# Patient Record
Sex: Male | Born: 1950 | Race: White | Hispanic: No | Marital: Married | Smoking: Former smoker
Health system: Southern US, Community
[De-identification: ages and names within clinical notes are randomized; demographics above are authoritative.]

## PROBLEM LIST (undated history)

## (undated) DIAGNOSIS — I340 Nonrheumatic mitral (valve) insufficiency: Secondary | ICD-10-CM

## (undated) DIAGNOSIS — T783XXA Angioneurotic edema, initial encounter: Secondary | ICD-10-CM

## (undated) DIAGNOSIS — E119 Type 2 diabetes mellitus without complications: Secondary | ICD-10-CM

## (undated) DIAGNOSIS — I219 Acute myocardial infarction, unspecified: Secondary | ICD-10-CM

## (undated) DIAGNOSIS — G4733 Obstructive sleep apnea (adult) (pediatric): Secondary | ICD-10-CM

## (undated) DIAGNOSIS — R7989 Other specified abnormal findings of blood chemistry: Secondary | ICD-10-CM

## (undated) DIAGNOSIS — I2510 Atherosclerotic heart disease of native coronary artery without angina pectoris: Secondary | ICD-10-CM

## (undated) DIAGNOSIS — I10 Essential (primary) hypertension: Secondary | ICD-10-CM

## (undated) DIAGNOSIS — E785 Hyperlipidemia, unspecified: Secondary | ICD-10-CM

## (undated) DIAGNOSIS — E669 Obesity, unspecified: Secondary | ICD-10-CM

## (undated) DIAGNOSIS — E8881 Metabolic syndrome: Secondary | ICD-10-CM

## (undated) DIAGNOSIS — R011 Cardiac murmur, unspecified: Secondary | ICD-10-CM

## (undated) DIAGNOSIS — E7800 Pure hypercholesterolemia, unspecified: Secondary | ICD-10-CM

## (undated) HISTORY — DX: Type 2 diabetes mellitus without complications: E11.9

## (undated) HISTORY — DX: Obstructive sleep apnea (adult) (pediatric): G47.33

## (undated) HISTORY — DX: Essential (primary) hypertension: I10

## (undated) HISTORY — DX: Cardiac murmur, unspecified: R01.1

## (undated) HISTORY — DX: Acute myocardial infarction, unspecified: I21.9

## (undated) HISTORY — DX: Nonrheumatic mitral (valve) insufficiency: I34.0

## (undated) HISTORY — DX: Obesity, unspecified: E66.9

## (undated) HISTORY — PX: CARDIAC CATHETERIZATION: SHX172

## (undated) HISTORY — DX: Other specified abnormal findings of blood chemistry: R79.89

## (undated) HISTORY — DX: Atherosclerotic heart disease of native coronary artery without angina pectoris: I25.10

## (undated) HISTORY — PX: SKIN SURGERY: SHX2413

## (undated) HISTORY — DX: Pure hypercholesterolemia, unspecified: E78.00

## (undated) HISTORY — DX: Angioneurotic edema, initial encounter: T78.3XXA

## (undated) HISTORY — DX: Metabolic syndrome: E88.810

## (undated) HISTORY — DX: Hyperlipidemia, unspecified: E78.5

## (undated) HISTORY — DX: Metabolic syndrome: E88.81

## (undated) HISTORY — PX: COLONOSCOPY: SHX174

---

## 2009-07-26 ENCOUNTER — Inpatient Hospital Stay (HOSPITAL_BASED_OUTPATIENT_CLINIC_OR_DEPARTMENT_OTHER): Admission: RE | Admit: 2009-07-26 | Discharge: 2009-07-26 | Payer: Self-pay | Admitting: Interventional Cardiology

## 2010-08-31 LAB — POCT I-STAT 3, ART BLOOD GAS (G3+)
Acid-base deficit: 1 mmol/L (ref 0.0–2.0)
O2 Saturation: 97 %
TCO2: 25 mmol/L (ref 0–100)

## 2010-08-31 LAB — POCT I-STAT 3, VENOUS BLOOD GAS (G3P V)
O2 Saturation: 77 %
TCO2: 25 mmol/L (ref 0–100)
pCO2, Ven: 40.8 mmHg — ABNORMAL LOW (ref 45.0–50.0)
pO2, Ven: 43 mmHg (ref 30.0–45.0)

## 2012-10-28 ENCOUNTER — Ambulatory Visit (INDEPENDENT_AMBULATORY_CARE_PROVIDER_SITE_OTHER): Payer: BC Managed Care – PPO

## 2012-10-28 ENCOUNTER — Ambulatory Visit (INDEPENDENT_AMBULATORY_CARE_PROVIDER_SITE_OTHER): Payer: BC Managed Care – PPO | Admitting: Family Medicine

## 2012-10-28 ENCOUNTER — Encounter: Payer: Self-pay | Admitting: Family Medicine

## 2012-10-28 VITALS — BP 111/65 | HR 65 | Temp 97.4°F | Ht 70.0 in | Wt 252.0 lb

## 2012-10-28 DIAGNOSIS — M25511 Pain in right shoulder: Secondary | ICD-10-CM

## 2012-10-28 DIAGNOSIS — E785 Hyperlipidemia, unspecified: Secondary | ICD-10-CM

## 2012-10-28 DIAGNOSIS — I10 Essential (primary) hypertension: Secondary | ICD-10-CM

## 2012-10-28 DIAGNOSIS — M25569 Pain in unspecified knee: Secondary | ICD-10-CM

## 2012-10-28 DIAGNOSIS — M25562 Pain in left knee: Secondary | ICD-10-CM

## 2012-10-28 DIAGNOSIS — M25519 Pain in unspecified shoulder: Secondary | ICD-10-CM | POA: Insufficient documentation

## 2012-10-28 DIAGNOSIS — E669 Obesity, unspecified: Secondary | ICD-10-CM | POA: Insufficient documentation

## 2012-10-28 DIAGNOSIS — R739 Hyperglycemia, unspecified: Secondary | ICD-10-CM | POA: Insufficient documentation

## 2012-10-28 DIAGNOSIS — R7309 Other abnormal glucose: Secondary | ICD-10-CM

## 2012-10-28 HISTORY — DX: Essential (primary) hypertension: I10

## 2012-10-28 IMAGING — CR DG SHOULDER 2+V*R*
4 series · 4 of 4 positions shown · non-contrast
Comparison: 07/25/2009

CLINICAL DATA: Right shoulder pain

RIGHT SHOULDER - 2+ VIEW

[view not recorded (1 of 4)]
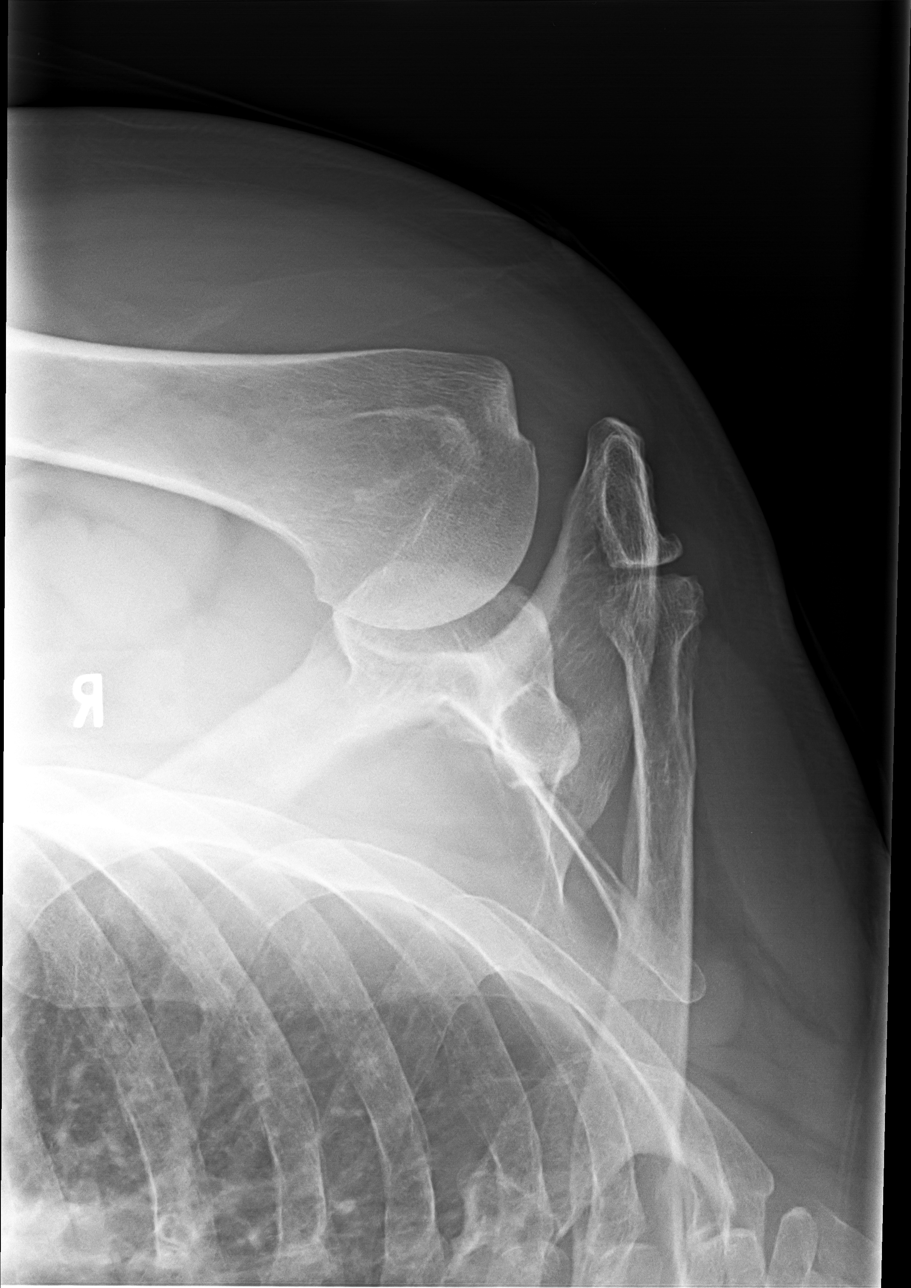

[view not recorded (2 of 4)]
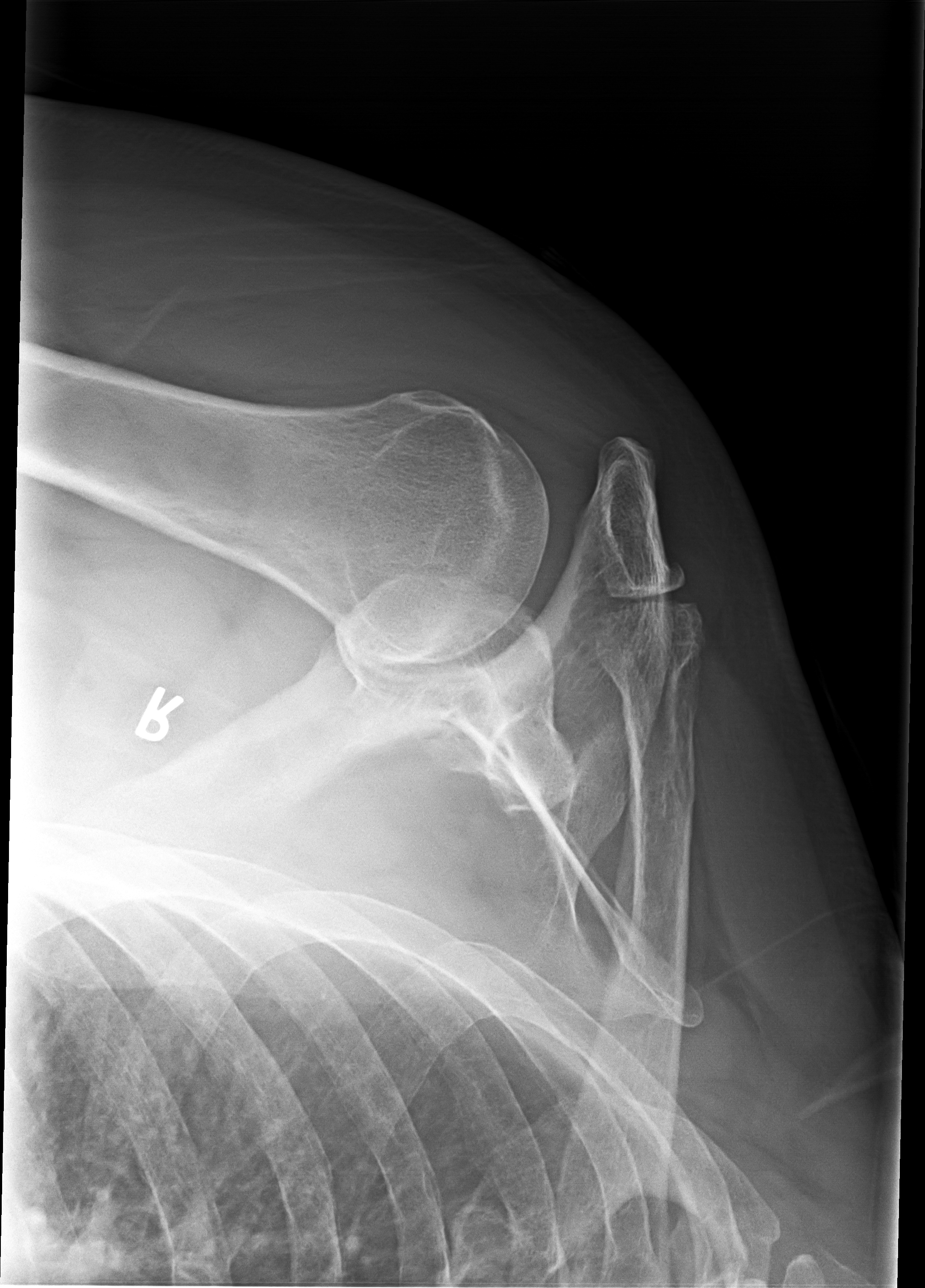

[view not recorded (3 of 4)]
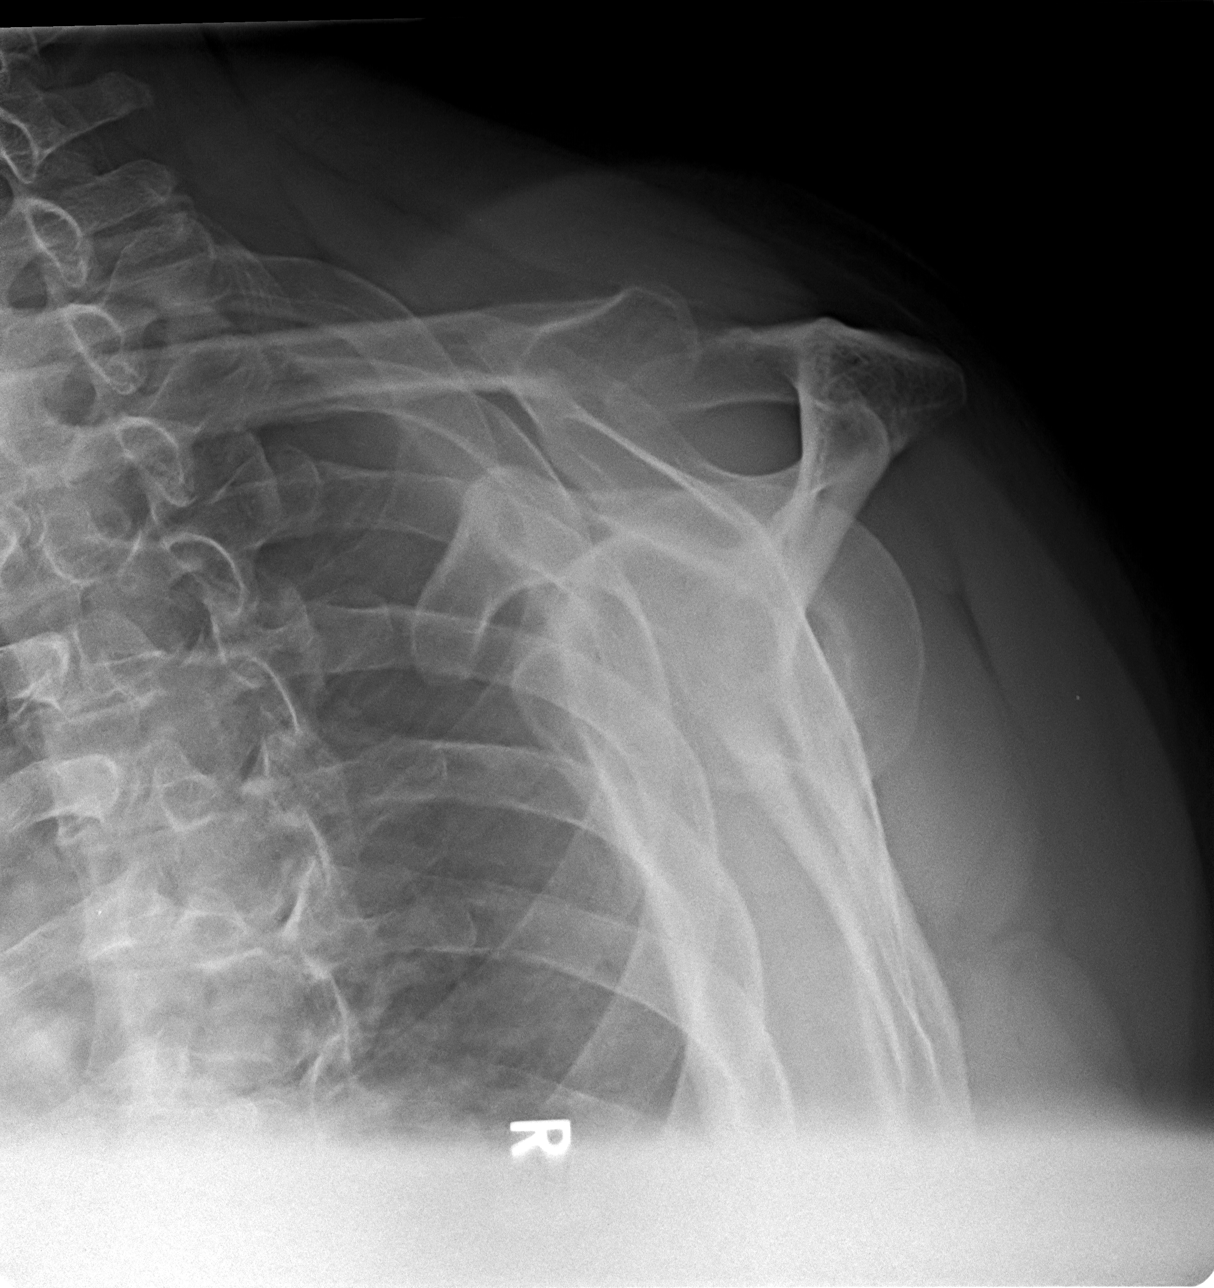

[view not recorded (4 of 4)]
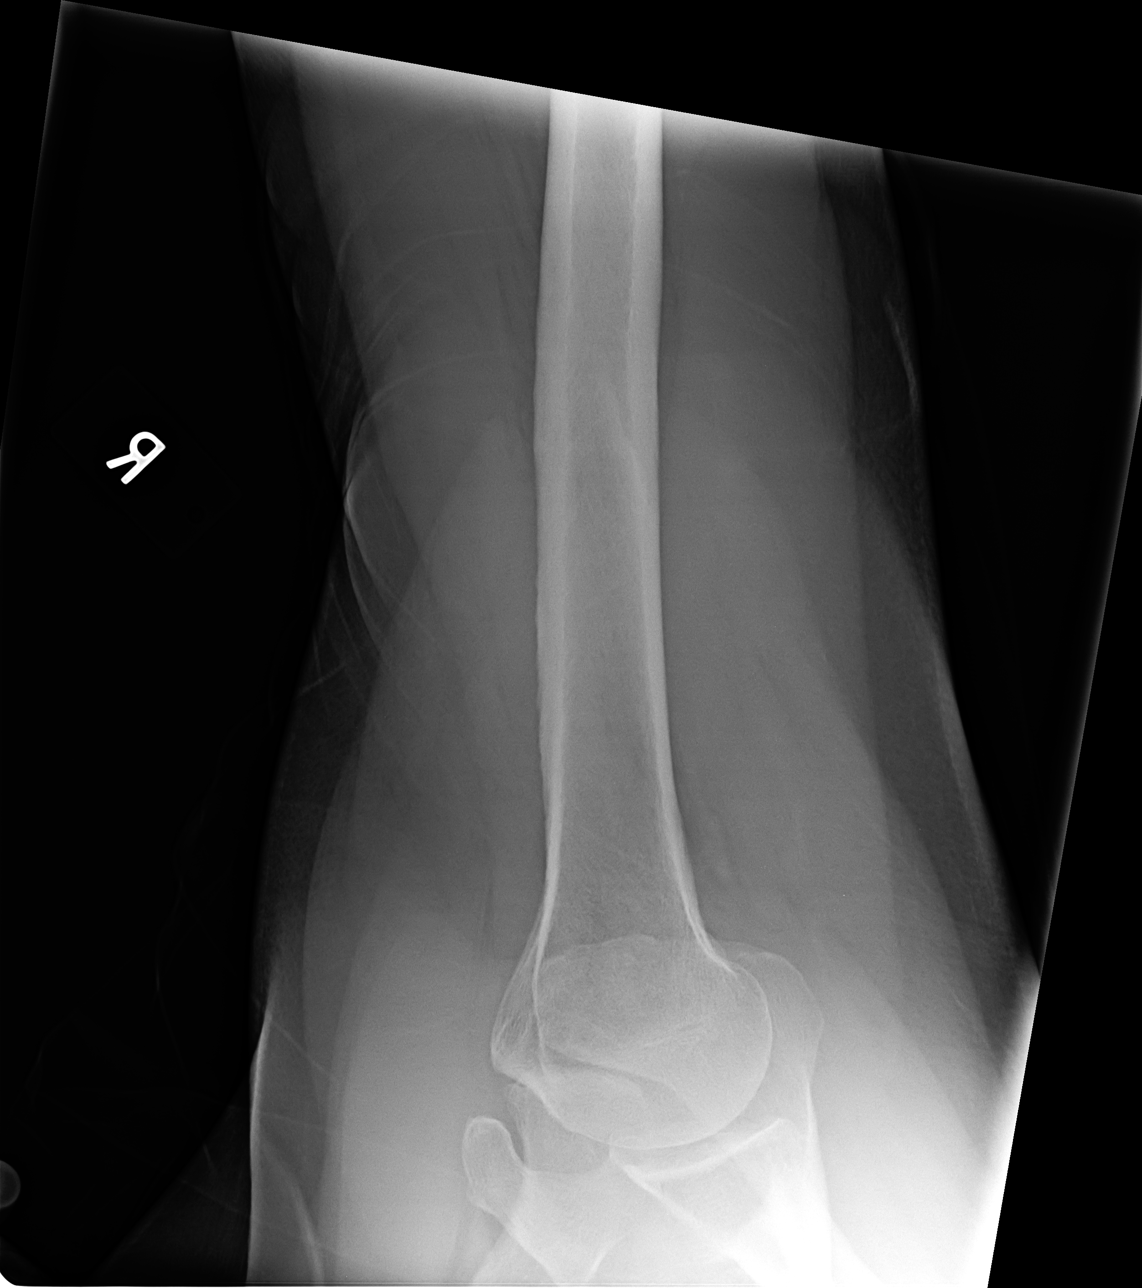

[4 of 4 positions shown; findings below may reference images not displayed]

FINDINGS: Mild right glenohumeral joint degenerative change and AC
joint spurring noted.  No fracture or dislocation.  Right lung apex
is clear.  No soft tissue abnormality.
IMPRESSION: No acute osseous abnormality.

## 2012-10-28 IMAGING — CR DG KNEE 1-2V*L*
2 series · 2 of 2 positions shown · non-contrast
Comparison: None.

CLINICAL DATA: Left knee pain and effusion

LEFT KNEE - 1-2 VIEW

[view not recorded (1 of 2)]
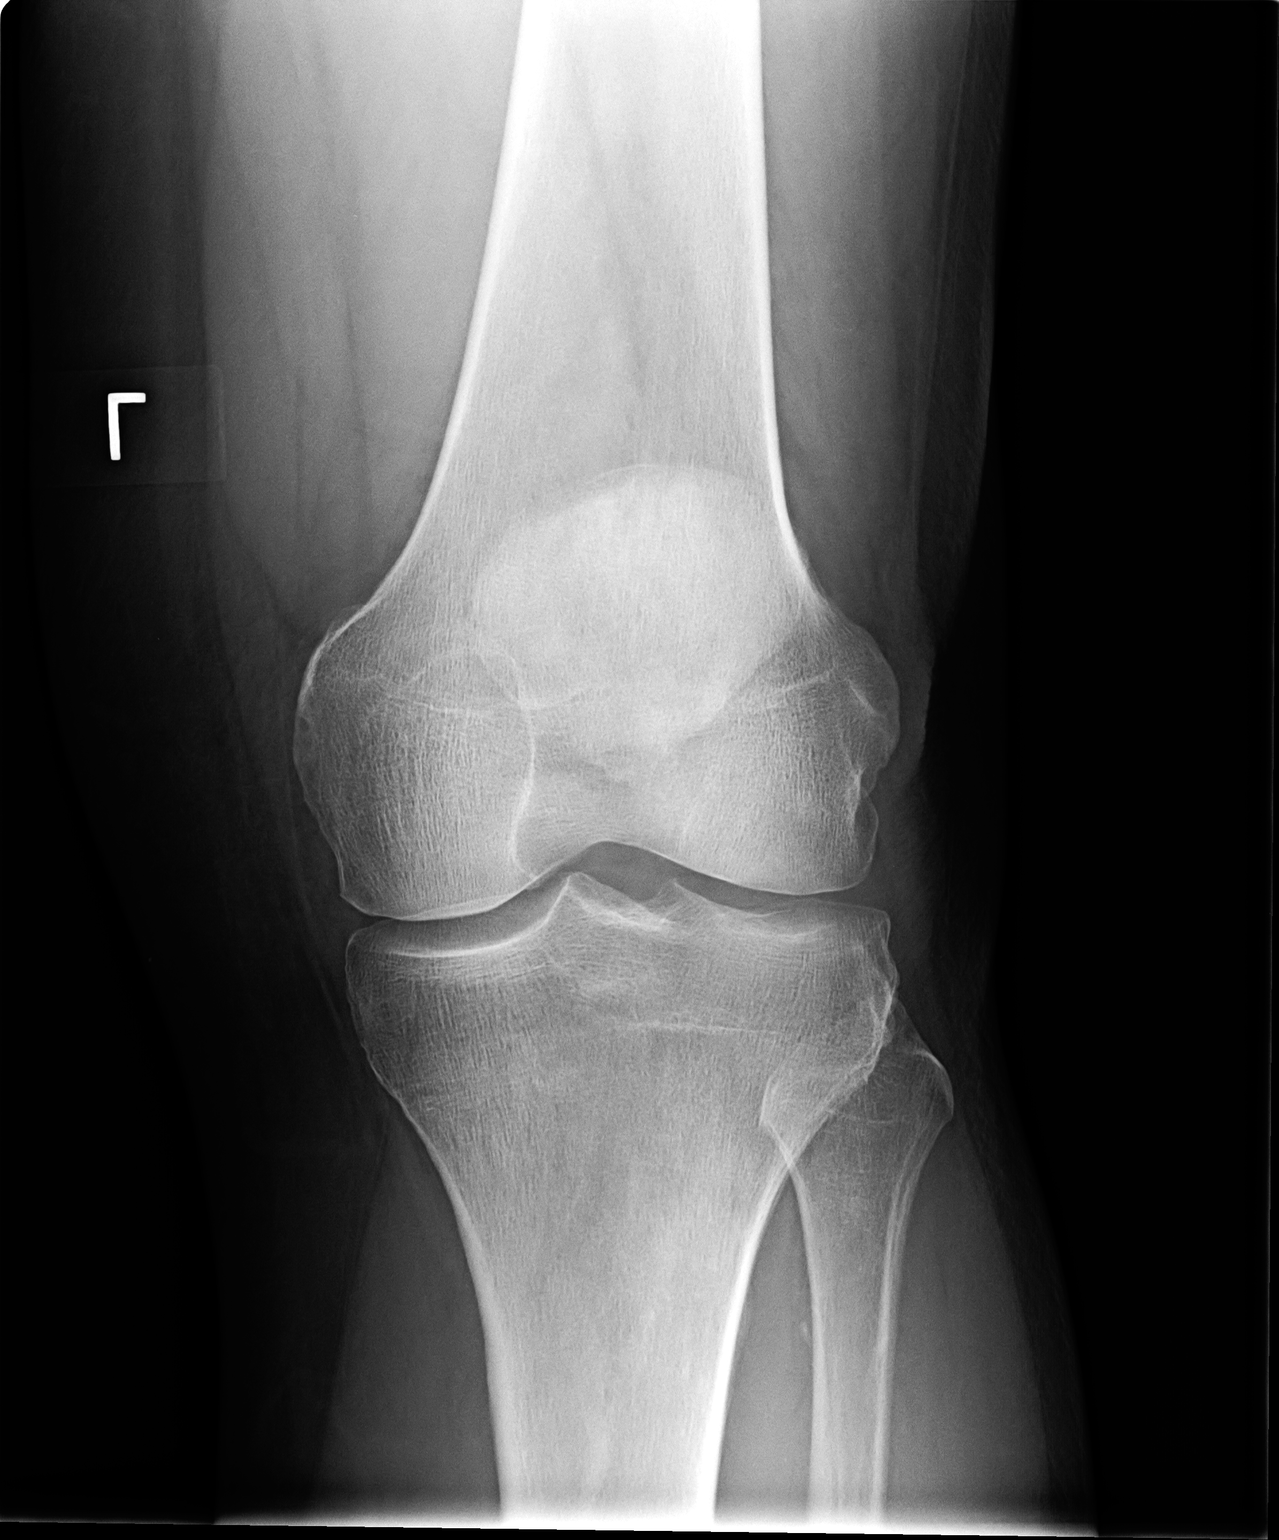

[view not recorded (2 of 2)]
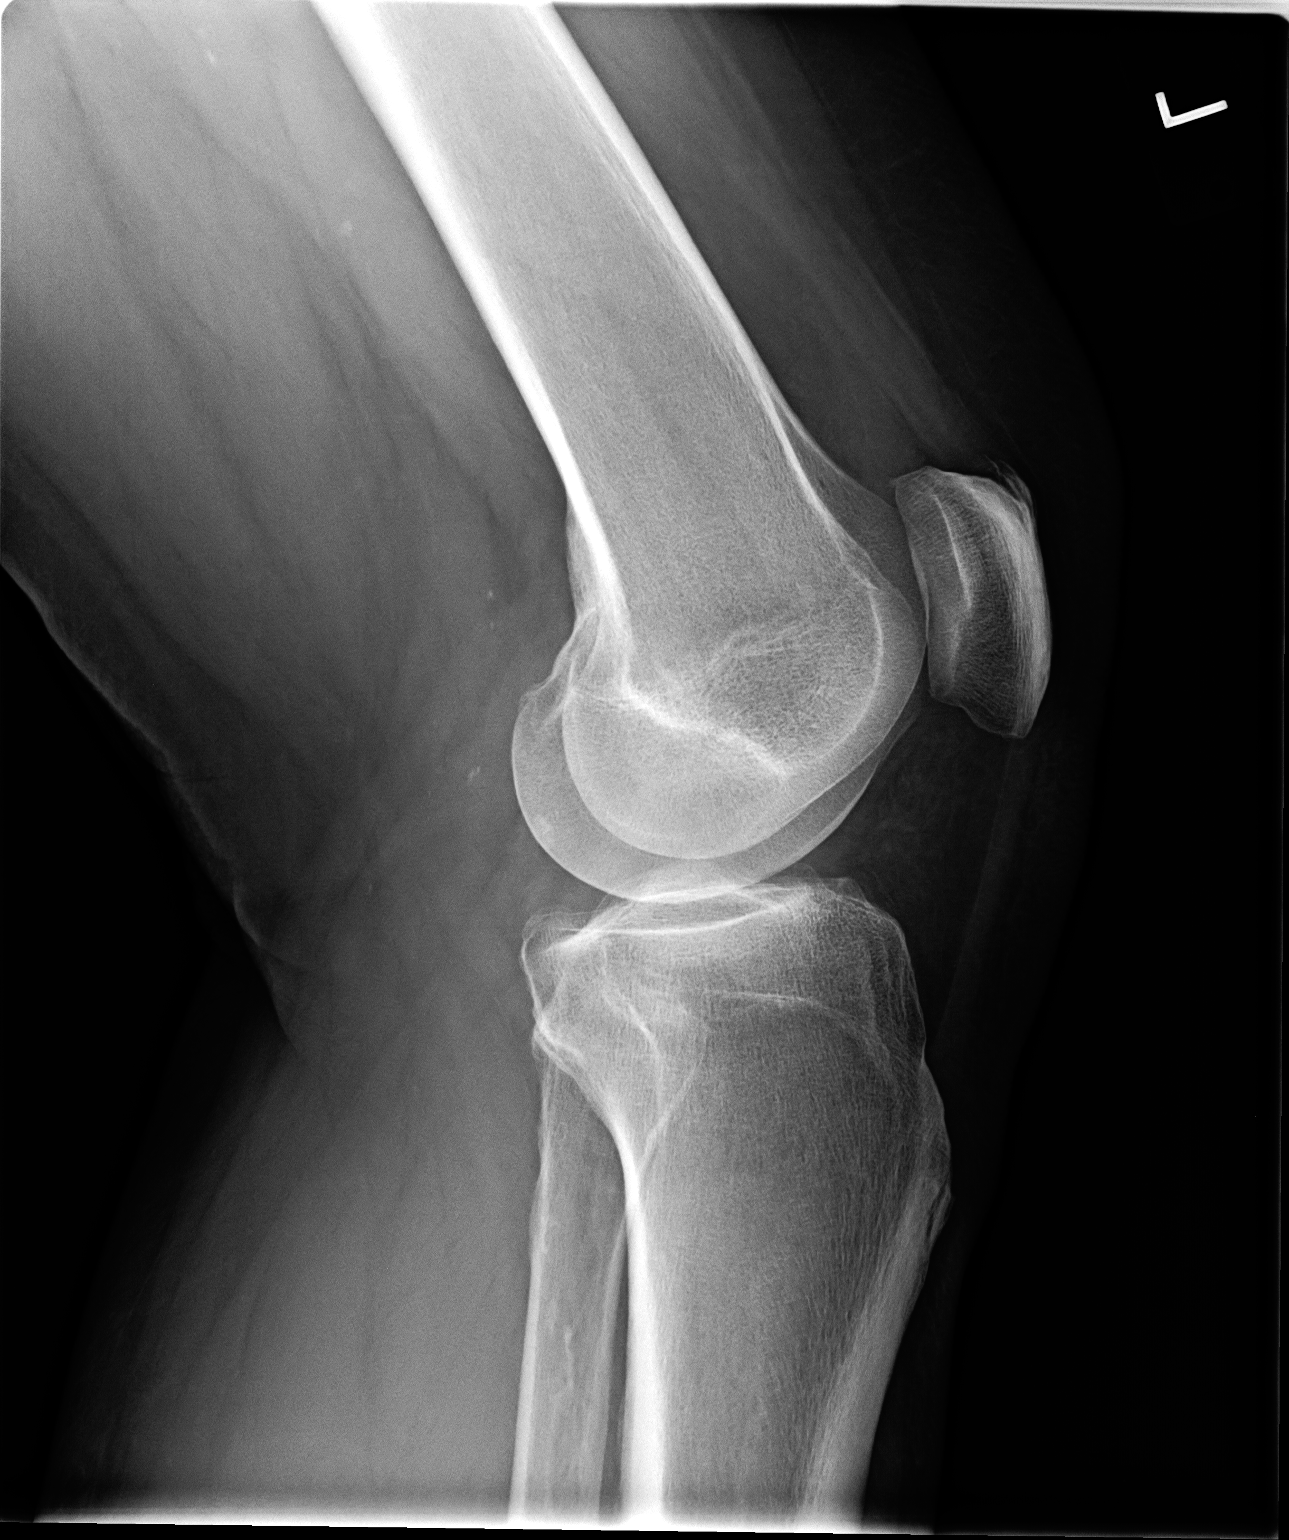

[2 of 2 positions shown; findings below may reference images not displayed]

FINDINGS: The joint spaces are well maintained and the medial and
lateral compartment.  There is spurring of the superior aspect of
the patella.  No joint effusion.
IMPRESSION: No acute findings.  Mild degenerative spurring of the patella.

## 2012-10-28 NOTE — Patient Instructions (Addendum)
Dr Katherina Right Book On: Power foods for Pain? Eat to Live by Dr Ottis Stain Dr Katherina Right Book on: Reversing Diabetes,   Labs at [facility].At your convenience.

## 2012-10-28 NOTE — Progress Notes (Signed)
Patient ID: Steven Mckee, male   DOB: 02-20-1951, 62 y.o.   MRN: [id number] SUBJECTIVE: HPI: When he sits for 15 minutes and  Stand up his knees pop.  Went to the [employer] in [place] . Walked many miles. End of 4 miles he has had liquid on the knees.   Shoulder was always weak. As a child he would easily through out his shoulders. Hurts to sleep. Lived in [place]. Frequent walking, easily sore hip. If he spends 1 hour in the car. he is stiff. Old frisbee fracture of the left foot. With exercise the circulation in legs were better. Good pulses in feet. With doppler at Dr Katrinka Blazing.  Otherwise feels healthy.  Once every 2 years he would get a pulled muscle., when he twists. Muscle around the shoulder. Diet has not been good.eating chicken and fish Exercising. None for 5 to 6 weeks.  PMH/PSH: reviewed/updated in Epic  SH/FH: reviewed/updated in Epic  Allergies: reviewed/updated in Epic  Medications: reviewed/updated in Epic  Immunizations: reviewed/updated in Epic  ROS: As above in the HPI. All other systems are stable or negative.  OBJECTIVE: APPEARANCE:  Patient in no acute distress.The patient appeared well nourished and normally developed. Acyanotic. Waist:45 VITAL SIGNS:BP 111/65  Pulse 65  Temp(Src) 97.4 F (36.3 C) (Oral)  Ht 5\' 10"  (1.778 m)  Wt 252 lb (114.306 kg)  BMI 36.16 kg/m2  Obese centrally large build. WM SKIN: warm and  Dry without overt rashes, tattoos and scars  HEAD and Neck: without JVD, Head and scalp: normal Eyes:No scleral icterus. Fundi normal, eye movements normal. Ears: Auricle normal, canal normal, Tympanic membranes normal, insufflation normal. Nose: normal Throat: normal Neck & thyroid: normal  CHEST & LUNGS: Chest wall: normal Lungs: Clear  CVS: Reveals the PMI to be normally located. Regular rhythm, First and Second Heart sounds are normal,  absence of murmurs, rubs or gallops. Peripheral vasculature: Radial pulses:  normal Dorsal pedis pulses: normal Posterior pulses: normal  ABDOMEN:  Appearance:obese Benign,, no organomegaly, no masses, no Abdominal Aortic enlargement. No Guarding , no rebound. No Bruits. Bowel sounds: normal  RECTAL: N/A GU: N/A  EXTREMETIES: nonedematous. Both Femoral and Pedal pulses are normal.  MUSCULOSKELETAL:  Spine: normal Joints:knees small effusion Crepitus bilaterally left >right. Right shoulder internal rotation is painful mildly.  NEUROLOGIC: oriented to time,place and person; nonfocal. Strength is normal Sensory is normal Reflexes are normal Cranial Nerves are normal.  ASSESSMENT: Pain in joint, shoulder region, right - Plan: DG Shoulder Right  Left knee pain - Plan: DG Knee 1-2 Views Left  Obesity, unspecified  Hyperglycemia - Plan: COMPLETE METABOLIC PANEL WITH GFR, Hemoglobin A1c, CANCELED: POCT glycosylated hemoglobin (Hb A1C)  HTN (hypertension) - Plan: COMPLETE METABOLIC PANEL WITH GFR, CANCELED: BASIC METABOLIC PANEL WITH GFR  HLD (hyperlipidemia) - Plan: NMR Lipoprofile with Lipids, COMPLETE METABOLIC PANEL WITH GFR, CANCELED: Hepatic function panel, CANCELED: NMR Lipoprofile with Lipids    PLAN: [facility] reading (PRIMARY) by  Dr. Modesto Charon:  Right Shoulder: minimal degenerative changes Left knee: spurring the superior patella. Minimal degenerative changes.  Orders Placed This Encounter  Procedures  . DG Knee 1-2 Views Left    Standing Status: Future     Number of Occurrences: 1     Standing Expiration Date: 12/28/2013    Order Specific Question:  Reason for Exam (SYMPTOM  OR DIAGNOSIS REQUIRED)    Answer:  left knee pain and effusion    Order Specific Question:  Preferred imaging location?  Answer:  Internal  . DG Shoulder Right    Standing Status: Future     Number of Occurrences: 1     Standing Expiration Date: 12/28/2013    Order Specific Question:  Reason for Exam (SYMPTOM  OR DIAGNOSIS REQUIRED)    Answer:  right shoulder pain     Order Specific Question:  Preferred imaging location?    Answer:  Internal  . NMR Lipoprofile with Lipids    Standing Status: Future     Number of Occurrences:      Standing Expiration Date: 10/28/2013  . COMPLETE METABOLIC PANEL WITH GFR    Standing Status: Future     Number of Occurrences:      Standing Expiration Date: 10/28/2013  . Hemoglobin A1c    Standing Status: Future     Number of Occurrences:      Standing Expiration Date: 10/28/2013   .thisvist Meds ordered this encounter  Medications  . lisinopril-hydrochlorothiazide (PRINZIDE,ZESTORETIC) 20-12.5 MG per tablet    Sig: Take 1 tablet by mouth daily.   . metoprolol succinate (TOPROL-XL) 25 MG 24 hr tablet    Sig: Take 25 mg by mouth daily.   . rosuvastatin (CRESTOR) 5 MG tablet    Sig: Take 5 mg by mouth daily.  Marland Kitchen aspirin 325 MG tablet    Sig: Take 325 mg by mouth daily.   Diet discussed. Books to read.  Exercises for the right rotator cuff. Weight loss to relieve the pressure on the joints.  Discussed  RTC 3 months.  Dayannara Pascal P. Modesto Charon, M.D.

## 2012-11-13 ENCOUNTER — Other Ambulatory Visit: Payer: Self-pay | Admitting: Family Medicine

## 2012-11-13 LAB — NMR LIPOPROFILE WITH LIPIDS
Cholesterol, Total: 126 mg/dL (ref <200)
HDL Particle Number: 40.7 umol/L (ref >=30.5)
HDL Size: 8.7 nm — ABNORMAL LOW (ref >=9.2)
HDL-C: 46 mg/dL (ref >=40)
LDL (calc): 58 mg/dL (ref <100)
LDL Particle Number: 896 nmol/L (ref <1000)
LDL Size: 20.2 nm — ABNORMAL LOW (ref >20.5)
LP-IR Score: 73 — ABNORMAL HIGH (ref <=45)
Large HDL-P: 4.5 umol/L — ABNORMAL LOW (ref >=4.8)
Large VLDL-P: 5.2 nmol/L — ABNORMAL HIGH (ref <=2.7)
Small LDL Particle Number: 523 nmol/L (ref <=527)
Triglycerides: 112 mg/dL (ref <150)
VLDL Size: 53.5 nm — ABNORMAL HIGH (ref <=46.6)

## 2012-11-13 LAB — COMPREHENSIVE METABOLIC PANEL
ALT: 38 U/L (ref 0–53)
AST: 36 U/L (ref 0–37)
Albumin: 4.9 g/dL (ref 3.5–5.2)
Alkaline Phosphatase: 51 U/L (ref 39–117)
BUN: 17 mg/dL (ref 6–23)
CO2: 28 mEq/L (ref 19–32)
Calcium: 9.6 mg/dL (ref 8.4–10.5)
Chloride: 104 mEq/L (ref 96–112)
Creat: 1.14 mg/dL (ref 0.50–1.35)
Glucose, Bld: 102 mg/dL — ABNORMAL HIGH (ref 70–99)
Potassium: 4.6 mEq/L (ref 3.5–5.3)
Sodium: 141 mEq/L (ref 135–145)
Total Bilirubin: 0.9 mg/dL (ref 0.3–1.2)
Total Protein: 6.9 g/dL (ref 6.0–8.3)

## 2012-11-13 LAB — HEMOGLOBIN A1C
Hgb A1c MFr Bld: 5.8 % — ABNORMAL HIGH (ref <5.7)
Mean Plasma Glucose: 120 mg/dL — ABNORMAL HIGH (ref <117)

## 2012-11-13 NOTE — Progress Notes (Signed)
Quick Note:  Lab result at goal. No change in Medications for now. No Change in plans and follow up. ______ 

## 2013-02-05 ENCOUNTER — Ambulatory Visit: Payer: BC Managed Care – PPO | Admitting: Family Medicine

## 2013-03-05 ENCOUNTER — Encounter: Payer: Self-pay | Admitting: Family Medicine

## 2013-03-05 ENCOUNTER — Ambulatory Visit (INDEPENDENT_AMBULATORY_CARE_PROVIDER_SITE_OTHER): Payer: BC Managed Care – PPO | Admitting: Family Medicine

## 2013-03-05 VITALS — BP 112/67 | HR 77 | Temp 98.5°F | Ht 70.0 in | Wt 251.4 lb

## 2013-03-05 DIAGNOSIS — N529 Male erectile dysfunction, unspecified: Secondary | ICD-10-CM

## 2013-03-05 DIAGNOSIS — R209 Unspecified disturbances of skin sensation: Secondary | ICD-10-CM

## 2013-03-05 DIAGNOSIS — Z125 Encounter for screening for malignant neoplasm of prostate: Secondary | ICD-10-CM

## 2013-03-05 DIAGNOSIS — R202 Paresthesia of skin: Secondary | ICD-10-CM

## 2013-03-05 DIAGNOSIS — R7309 Other abnormal glucose: Secondary | ICD-10-CM

## 2013-03-05 DIAGNOSIS — R739 Hyperglycemia, unspecified: Secondary | ICD-10-CM

## 2013-03-05 LAB — POCT GLYCOSYLATED HEMOGLOBIN (HGB A1C): Hemoglobin A1C: 5.7

## 2013-03-05 NOTE — Progress Notes (Signed)
Patient ID: Steven Mckee, male   DOB: [birth year], 62 y.o.   MRN: [id number] SUBJECTIVE: CC: Chief Complaint  Patient presents with  . Follow-up    3 month follow up chronic problems needs blood sugar had toast with jelly .    HPI:  Patient is here for follow up of hyperlipidemia/hyperglycemia: denies Headache;denies Chest Pain;denies weakness;denies Shortness of Breath and orthopnea;denies Visual changes;denies palpitations;denies cough;denies pedal edema;denies symptoms of TIA or stroke;deniesClaudication symptoms. admits to Compliance with medications; denies Problems with medications.  Recently saw Dr Katrinka Blazing and checked pout well. Lipids were fine. 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  Needs the blood sugar checked When he exercises a lot he gets a tingling between the toes.  Past Medical History  Diagnosis Date  . Coronary artery disease   . Mitral valve regurgitation   . Obesity   . Metabolic syndrome   . Obesity   . Hypercholesteremia   . CAD (coronary artery disease)    Past Surgical History  Procedure Laterality Date  . Skin surgery     History   Social History  . Marital Status: Single    Spouse Name: N/A    Number of Children: N/A  . Years of Education: N/A   Occupational History  . Not on file.   Social History Main Topics  . Smoking status: Former Smoker    Quit date: 03/05/2009  . Smokeless tobacco: Not on file  . Alcohol Use: Not on file  . Drug Use: Not on file  . Sexual Activity: Not on file   Other Topics Concern  . Not on file   Social History Narrative  . No narrative on file   Family History  Problem Relation Age of Onset  . Dementia Mother   . Heart disease Father   . Stroke Father    Current Outpatient Prescriptions on File Prior to Visit  Medication Sig Dispense Refill  . aspirin 325 MG tablet Take 325 mg by mouth daily.      Marland Kitchen lisinopril-hydrochlorothiazide (PRINZIDE,ZESTORETIC) 20-12.5 MG per tablet Take 1 tablet by  mouth daily.       . metoprolol succinate (TOPROL-XL) 25 MG 24 hr tablet Take 25 mg by mouth daily.       . rosuvastatin (CRESTOR) 5 MG tablet Take 5 mg by mouth daily.       No current facility-administered medications on file prior to visit.   No Known Allergies Immunization History  Administered Date(s) Administered  . Influenza-Generic 03/04/2013   Prior to Admission medications   Medication Sig Start Date End Date Taking? Authorizing Provider  aspirin 325 MG tablet Take 325 mg by mouth daily.   Yes Historical Provider, MD  lisinopril-hydrochlorothiazide (PRINZIDE,ZESTORETIC) 20-12.5 MG per tablet Take 1 tablet by mouth daily.  10/27/12  Yes Historical Provider, MD  metoprolol succinate (TOPROL-XL) 25 MG 24 hr tablet Take 25 mg by mouth daily.  10/16/12  Yes Historical Provider, MD  rosuvastatin (CRESTOR) 5 MG tablet Take 5 mg by mouth daily.   Yes Historical Provider, MD     ROS: As above in the HPI. All other systems are stable or negative.  OBJECTIVE: APPEARANCE:  Patient in no acute distress.The patient appeared well nourished and normally developed. Acyanotic. Waist: VITAL SIGNS:BP 112/67  Pulse 77  Temp(Src) 98.5 F (36.9 C) (Oral)  Ht 5\' 10"  (1.778 m)  Wt 251 lb 6.4 oz (114.034 kg)  BMI 36.07 kg/m2 WM obese  SKIN: warm and  Dry without  overt rashes, tattoos and scars  HEAD and Neck: without JVD, Head and scalp: normal Eyes:No scleral icterus. Fundi normal, eye movements normal. Ears: Auricle normal, canal normal, Tympanic membranes normal, insufflation normal. Nose: normal Throat: normal Neck & thyroid: normal  CHEST & LUNGS: Chest wall: normal Lungs: Clear  CVS: Reveals the PMI to be normally located. Regular rhythm, First and Second Heart sounds are normal,  absence of murmurs, rubs or gallops. Peripheral vasculature: Radial pulses: normal Dorsal pedis pulses: normal Posterior pulses: normal  ABDOMEN:  Appearance:obese Benign, no organomegaly,  no masses, no Abdominal Aortic enlargement. No Guarding , no rebound. No Bruits. Bowel sounds: normal  RECTAL: N/A GU: N/A  EXTREMETIES: nonedematous.  MUSCULOSKELETAL:  Spine: normal Joints: intact  NEUROLOGIC: oriented to time,place and person; nonfocal. Strength is normal Sensory is normal Reflexes are normal Cranial Nerves are normal. Results for orders placed in visit on 03/05/13  POCT GLYCOSYLATED HEMOGLOBIN (HGB A1C)      Result Value Range   Hemoglobin A1C 5.7      ASSESSMENT: Hyperglycemia - Plan: BMP8+EGFR, POCT glycosylated hemoglobin (Hb A1C), POCT glucose (manual entry)  Paresthesia of foot - Plan: TSH, Vitamin B12, Folate  Erectile dysfunction - Plan: Testosterone,Free and Total  Screening for prostate cancer - Plan: PSA, total and free  PLAN: Wear good exervcise shoes  Handout on nutrition books from [facility]. 21 day kickstart , eytc.  Orders Placed This Encounter  Procedures  . BMP8+EGFR  . TSH  . Vitamin B12  . Folate  . Testosterone,Free and Total  . PSA, total and free  . POCT glycosylated hemoglobin (Hb A1C)  . POCT glucose (manual entry)    No orders of the defined types were placed in this encounter.    Return in about 3 months (around 06/04/2013) for Recheck medical problems.  Ryenn Howeth P. Modesto Charon, M.D.

## 2013-03-06 LAB — BMP8+EGFR
BUN/Creatinine Ratio: 21 (ref 10–22)
BUN: 21 mg/dL (ref 8–27)
CO2: 23 mmol/L (ref 18–29)
Calcium: 9.5 mg/dL (ref 8.6–10.2)
Chloride: 101 mmol/L (ref 97–108)
Creatinine, Ser: 0.99 mg/dL (ref 0.76–1.27)
GFR calc Af Amer: 95 mL/min/{1.73_m2} (ref >59)
GFR calc non Af Amer: 82 mL/min/{1.73_m2} (ref >59)
Glucose: 72 mg/dL (ref 65–99)
Potassium: 4.3 mmol/L (ref 3.5–5.2)
Sodium: 141 mmol/L (ref 134–144)

## 2013-03-06 LAB — TESTOSTERONE,FREE AND TOTAL
Testosterone, Free: 3.9 pg/mL — ABNORMAL LOW (ref 6.6–18.1)
Testosterone: 269 ng/dL — ABNORMAL LOW (ref 348–1197)

## 2013-03-06 LAB — PSA, TOTAL AND FREE
PSA, Free Pct: 58 %
PSA, Free: 0.29 ng/mL
PSA: 0.5 ng/mL (ref 0.0–4.0)

## 2013-03-06 LAB — VITAMIN B12: Vitamin B-12: 290 pg/mL (ref 211–946)

## 2013-03-06 LAB — TSH: TSH: 1.59 u[IU]/mL (ref 0.450–4.500)

## 2013-03-06 LAB — FOLATE: Folate: 15.3 ng/mL (ref >3.0)

## 2013-03-09 NOTE — Progress Notes (Signed)
Quick Note:  Labs abnormal. Low testosterone. Diet exercise and weight loss can increase a little. May need to go on testosterone replacement. I would rather that he does exercise , diet and weight loss and repeat the testosterone in 3 months. If still low Oral start on replacement therapy. ______

## 2013-05-13 ENCOUNTER — Other Ambulatory Visit: Payer: Self-pay | Admitting: Interventional Cardiology

## 2013-06-15 ENCOUNTER — Ambulatory Visit (INDEPENDENT_AMBULATORY_CARE_PROVIDER_SITE_OTHER): Payer: No Typology Code available for payment source | Admitting: Family Medicine

## 2013-06-15 ENCOUNTER — Encounter: Payer: Self-pay | Admitting: Family Medicine

## 2013-06-15 VITALS — BP 107/65 | HR 69 | Temp 98.1°F | Ht 71.0 in | Wt 252.6 lb

## 2013-06-15 DIAGNOSIS — E8881 Metabolic syndrome: Secondary | ICD-10-CM | POA: Insufficient documentation

## 2013-06-15 DIAGNOSIS — I10 Essential (primary) hypertension: Secondary | ICD-10-CM

## 2013-06-15 DIAGNOSIS — R7989 Other specified abnormal findings of blood chemistry: Secondary | ICD-10-CM | POA: Insufficient documentation

## 2013-06-15 DIAGNOSIS — I2510 Atherosclerotic heart disease of native coronary artery without angina pectoris: Secondary | ICD-10-CM

## 2013-06-15 DIAGNOSIS — E291 Testicular hypofunction: Secondary | ICD-10-CM

## 2013-06-15 DIAGNOSIS — R7309 Other abnormal glucose: Secondary | ICD-10-CM

## 2013-06-15 DIAGNOSIS — I25119 Atherosclerotic heart disease of native coronary artery with unspecified angina pectoris: Secondary | ICD-10-CM | POA: Insufficient documentation

## 2013-06-15 DIAGNOSIS — R739 Hyperglycemia, unspecified: Secondary | ICD-10-CM

## 2013-06-15 DIAGNOSIS — E669 Obesity, unspecified: Secondary | ICD-10-CM

## 2013-06-15 LAB — POCT GLYCOSYLATED HEMOGLOBIN (HGB A1C): Hemoglobin A1C: 5.8

## 2013-06-15 NOTE — Progress Notes (Signed)
Patient ID: Khush Pasion, male   DOB: [birth year], 63 y.o.   MRN: [id number] SUBJECTIVE: CC: Chief Complaint  Patient presents with  . Follow-up    3 month follow up    HPI:  Past Medical History  Diagnosis Date  . Coronary artery disease   . Mitral valve regurgitation   . Obesity   . Metabolic syndrome   . Obesity   . Hypercholesteremia   . CAD (coronary artery disease)   . Low testosterone    Past Surgical History  Procedure Laterality Date  . Skin surgery     History   Social History  . Marital Status: Single    Spouse Name: N/A    Number of Children: N/A  . Years of Education: N/A   Occupational History  . Not on file.   Social History Main Topics  . Smoking status: Former Smoker    Quit date: 03/05/2009  . Smokeless tobacco: Not on file  . Alcohol Use: Not on file  . Drug Use: Not on file  . Sexual Activity: Not on file   Other Topics Concern  . Not on file   Social History Narrative  . No narrative on file   Family History  Problem Relation Age of Onset  . Dementia Mother   . Heart disease Father   . Stroke Father    Current Outpatient Prescriptions on File Prior to Visit  Medication Sig Dispense Refill  . aspirin 325 MG tablet Take 325 mg by mouth daily.      Marland Kitchen lisinopril-hydrochlorothiazide (PRINZIDE,ZESTORETIC) 20-12.5 MG per tablet TAKE 1 TABLET BY MOUTH EVERY DAY  30 tablet  10  . metoprolol succinate (TOPROL-XL) 25 MG 24 hr tablet Take 25 mg by mouth daily.       . rosuvastatin (CRESTOR) 5 MG tablet Take 5 mg by mouth daily.       No current facility-administered medications on file prior to visit.   No Known Allergies Immunization History  Administered Date(s) Administered  . Influenza-Unspecified 03/04/2013   Prior to Admission medications   Medication Sig Start Date End Date Taking? Authorizing Provider  aspirin 325 MG tablet Take 325 mg by mouth daily.    Historical Provider, MD  lisinopril-hydrochlorothiazide  (PRINZIDE,ZESTORETIC) 20-12.5 MG per tablet TAKE 1 TABLET BY MOUTH EVERY DAY 05/13/13   Belva Crome III, MD  metoprolol succinate (TOPROL-XL) 25 MG 24 hr tablet Take 25 mg by mouth daily.  10/16/12   Historical Provider, MD  rosuvastatin (CRESTOR) 5 MG tablet Take 5 mg by mouth daily.    Historical Provider, MD     ROS: As above in the HPI. All other systems are stable or negative.  OBJECTIVE: APPEARANCE:  Patient in no acute distress.The patient appeared well nourished and normally developed. Acyanotic. Waist: VITAL SIGNS:BP 107/65  Pulse 69  Temp(Src) 98.1 F (36.7 C) (Oral)  Ht $R'5\' 11"'gT$  (1.803 m)  Wt 252 lb 9.6 oz (114.579 kg)  BMI 35.25 kg/m2 WM central obesity   SKIN: warm and  Dry without overt rashes, tattoos and scars  HEAD and Neck: without JVD, Head and scalp: normal Eyes:No scleral icterus. Fundi normal, eye movements normal. Ears: Auricle normal, canal normal, Tympanic membranes normal, insufflation normal. Nose: normal Throat: normal Neck & thyroid: normal  CHEST & LUNGS: Chest wall: normal Lungs: Clear  CVS: Reveals the PMI to be normally located. Regular rhythm, First and Second Heart sounds are normal,  absence of murmurs, rubs or gallops. Peripheral vasculature: Radial  pulses: normal Dorsal pedis pulses: normal Posterior pulses: normal  ABDOMEN:  Appearance: Obese Benign, no organomegaly, no masses, no Abdominal Aortic enlargement. No Guarding , no rebound. No Bruits. Bowel sounds: normal  RECTAL: N/A GU: N/A  EXTREMETIES: nonedematous.  MUSCULOSKELETAL:  Spine: normal Joints: intact  NEUROLOGIC: oriented to time,place and person; nonfocal. Strength is normal Sensory is normal Reflexes are normal Cranial Nerves are normal.  Results for orders placed in visit on 03/05/13  BMP8+EGFR      Result Value Range   Glucose 72  65 - 99 mg/dL   BUN 21  8 - 27 mg/dL   Creatinine, Ser 0.99  0.76 - 1.27 mg/dL   GFR calc non Af Amer 82  >59  mL/min/1.73   GFR calc Af Amer 95  >59 mL/min/1.73   BUN/Creatinine Ratio 21  10 - 22   Sodium 141  134 - 144 mmol/L   Potassium 4.3  3.5 - 5.2 mmol/L   Chloride 101  97 - 108 mmol/L   CO2 23  18 - 29 mmol/L   Calcium 9.5  8.6 - 10.2 mg/dL  TSH      Result Value Range   TSH 1.590  0.450 - 4.500 uIU/mL  VITAMIN B12      Result Value Range   Vitamin B-12 290  211 - 946 pg/mL  FOLATE      Result Value Range   Folate 15.3  >3.0 ng/mL  TESTOSTERONE,FREE AND TOTAL      Result Value Range   Testosterone 269 (*) 348 - 1197 ng/dL   COMMENT Comment     Testosterone, Free 3.9 (*) 6.6 - 18.1 pg/mL  PSA, TOTAL AND FREE      Result Value Range   PSA 0.5  0.0 - 4.0 ng/mL   PSA, Free 0.29  N/A ng/mL   PSA, Free Pct 58.0    POCT GLYCOSYLATED HEMOGLOBIN (HGB A1C)      Result Value Range   Hemoglobin A1C 5.7      ASSESSMENT:  Hyperglycemia - Plan: POCT glycosylated hemoglobin (Hb A1C), CMP14+EGFR  Obesity, unspecified  Metabolic syndrome - Plan: CMP14+EGFR, NMR, lipoprofile  HTN (hypertension) - Plan: CMP14+EGFR  Coronary artery disease - Plan: NMR, lipoprofile  Low testosterone - Plan: Testosterone,Free and Total  PLAN:      Dr Paula Libra Recommendations  For nutrition information, I recommend books:  1).Eat to Live by Dr Excell Seltzer. 2).Prevent and Reverse Heart Disease by Dr Karl Luke. 3) Dr Janene Harvey Book:  Program to Reverse Diabetes  Exercise recommendations are:  If unable to walk, then the patient can exercise in a chair 3 times a day. By flapping arms like a bird gently and raising legs outwards to the front.  If ambulatory, the patient can go for walks for 30 minutes 3 times a week. Then increase the intensity and duration as tolerated.  Goal is to try to attain exercise frequency to 5 times a week.  If applicable: Best to perform resistance exercises (machines or weights) 2 days a week and cardio type exercises 3 days per week.   Discussed  low T and need to lose weight and exercise. Risks for CV event with Testosterone use and risks associated  Discussed and benefits discussed. Patient opted to wait and exercise. And lose weight.  Discussed prediabetes.  Orders Placed This Encounter  Procedures  . CMP14+EGFR  . NMR, lipoprofile  . Testosterone,Free and Total  . POCT glycosylated hemoglobin (Hb A1C)  No orders of the defined types were placed in this encounter.   There are no discontinued medications. Return in about 3 months (around 09/13/2013) for Recheck medical problems.  Suprena Travaglini P. Jacelyn Grip, M.D.

## 2013-06-15 NOTE — Patient Instructions (Signed)
Hyperglycemia Hyperglycemia occurs when the glucose (sugar) in your blood is too high. Hyperglycemia can happen for many reasons, but it most often happens to people who do not know they have diabetes or are not managing their diabetes properly.  CAUSES  Whether you have diabetes or not, there are other causes of hyperglycemia. Hyperglycemia can occur when you have diabetes, but it can also occur in other situations that you might not be as aware of, such as: Diabetes  If you have diabetes and are having problems controlling your blood glucose, hyperglycemia could occur because of some of the following reasons:  Not following your meal plan.  Not taking your diabetes medications or not taking it properly.  Exercising less or doing less activity than you normally do.  Being sick. Pre-diabetes  This cannot be ignored. Before people develop Type 2 diabetes, they almost always have "pre-diabetes." This is when your blood glucose levels are higher than normal, but not yet high enough to be diagnosed as diabetes. Research has shown that some long-term damage to the body, especially the heart and circulatory system, may already be occurring during pre-diabetes. If you take action to manage your blood glucose when you have pre-diabetes, you may delay or prevent Type 2 diabetes from developing. Stress  If you have diabetes, you may be "diet" controlled or on oral medications or insulin to control your diabetes. However, you may find that your blood glucose is higher than usual in the hospital whether you have diabetes or not. This is often referred to as "stress hyperglycemia." Stress can elevate your blood glucose. This happens because of hormones put out by the body during times of stress. If stress has been the cause of your high blood glucose, it can be followed regularly by your caregiver. That way he/she can make sure your hyperglycemia does not continue to get worse or progress to  diabetes. Steroids  Steroids are medications that act on the infection fighting system (immune system) to block inflammation or infection. One side effect can be a rise in blood glucose. Most people can produce enough extra insulin to allow for this rise, but for those who cannot, steroids make blood glucose levels go even higher. It is not unusual for steroid treatments to "uncover" diabetes that is developing. It is not always possible to determine if the hyperglycemia Json go away after the steroids are stopped. A special blood test called an A1c is sometimes done to determine if your blood glucose was elevated before the steroids were started. SYMPTOMS  Thirsty.  Frequent urination.  Dry mouth.  Blurred vision.  Tired or fatigue.  Weakness.  Sleepy.  Tingling in feet or leg. DIAGNOSIS  Diagnosis is made by monitoring blood glucose in one or all of the following ways:  A1c test. This is a chemical found in your blood.  Fingerstick blood glucose monitoring.  Laboratory results. TREATMENT  First, knowing the cause of the hyperglycemia is important before the hyperglycemia can be treated. Treatment may include, but is not be limited to:  Education.  Change or adjustment in medications.  Change or adjustment in meal plan.  Treatment for an illness, infection, etc.  More frequent blood glucose monitoring.  Change in exercise plan.  Decreasing or stopping steroids.  Lifestyle changes. HOME CARE INSTRUCTIONS   Test your blood glucose as directed.  Exercise regularly. Your caregiver Eidan give you instructions about exercise. Pre-diabetes or diabetes which comes on with stress is helped by exercising.  Eat wholesome,   balanced meals. Eat often and at regular, fixed times. Your caregiver or nutritionist Tavish give you a meal plan to guide your sugar intake.  Being at an ideal weight is important. If needed, losing as little as 10 to 15 pounds may help improve blood  glucose levels. SEEK MEDICAL CARE IF:   You have questions about medicine, activity, or diet.  You continue to have symptoms (problems such as increased thirst, urination, or weight gain). SEEK IMMEDIATE MEDICAL CARE IF:   You are vomiting or have diarrhea.  Your breath smells fruity.  You are breathing faster or slower.  You are very sleepy or incoherent.  You have numbness, tingling, or pain in your feet or hands.  You have chest pain.  Your symptoms get worse even though you have been following your caregiver's orders.  If you have any other questions or concerns. Document Released: 11/21/2000 Document Revised: 08/20/2011 Document Reviewed: 09/24/2011 [facility] Patient Information 2014 [place], [place].        Dr Paula Libra Recommendations  For nutrition information, I recommend books:  1).Eat to Live by Dr Excell Seltzer. 2).Prevent and Reverse Heart Disease by Dr Karl Luke. 3) Dr Janene Harvey Book:  Program to Reverse Diabetes  Exercise recommendations are:  If unable to walk, then the patient can exercise in a chair 3 times a day. By flapping arms like a bird gently and raising legs outwards to the front.  If ambulatory, the patient can go for walks for 30 minutes 3 times a week. Then increase the intensity and duration as tolerated.  Goal is to try to attain exercise frequency to 5 times a week.  If applicable: Best to perform resistance exercises (machines or weights) 2 days a week and cardio type exercises 3 days per week.

## 2013-06-16 LAB — CMP14+EGFR
ALT: 38 IU/L (ref 0–44)
AST: 31 IU/L (ref 0–40)
Albumin/Globulin Ratio: 2 (ref 1.1–2.5)
Albumin: 4.5 g/dL (ref 3.6–4.8)
Alkaline Phosphatase: 52 IU/L (ref 39–117)
BUN/Creatinine Ratio: 16 (ref 10–22)
BUN: 16 mg/dL (ref 8–27)
CO2: 25 mmol/L (ref 18–29)
Calcium: 9.6 mg/dL (ref 8.6–10.2)
Chloride: 101 mmol/L (ref 97–108)
Creatinine, Ser: 0.99 mg/dL (ref 0.76–1.27)
GFR calc Af Amer: 94 mL/min/{1.73_m2} (ref >59)
GFR calc non Af Amer: 81 mL/min/{1.73_m2} (ref >59)
Globulin, Total: 2.3 g/dL (ref 1.5–4.5)
Glucose: 96 mg/dL (ref 65–99)
Potassium: 4.9 mmol/L (ref 3.5–5.2)
Sodium: 142 mmol/L (ref 134–144)
Total Bilirubin: 0.4 mg/dL (ref 0.0–1.2)
Total Protein: 6.8 g/dL (ref 6.0–8.5)

## 2013-06-16 LAB — NMR, LIPOPROFILE
Cholesterol: 153 mg/dL (ref <200)
HDL Cholesterol by NMR: 61 mg/dL (ref >=40)
HDL Particle Number: 42.2 umol/L (ref >=30.5)
LDL Particle Number: 921 nmol/L (ref <1000)
LDL Size: 20.5 nm — ABNORMAL LOW (ref >20.5)
LDLC SERPL CALC-MCNC: 78 mg/dL (ref <100)
LP-IR Score: 52 — ABNORMAL HIGH (ref <=45)
Small LDL Particle Number: 497 nmol/L (ref <=527)
Triglycerides by NMR: 68 mg/dL (ref <150)

## 2013-06-16 LAB — TESTOSTERONE,FREE AND TOTAL
Testosterone, Free: 4.9 pg/mL — ABNORMAL LOW (ref 6.6–18.1)
Testosterone: 185 ng/dL — ABNORMAL LOW (ref 348–1197)

## 2013-06-16 LAB — SPECIMEN STATUS REPORT

## 2013-06-17 NOTE — Progress Notes (Signed)
Quick Note:  Call Patient Labs that are abnormal:  Testosterone is even lower Prediabetes.  The rest are at goal  Recommendations: May need to start on testosterone replacement if the next level continues to be even lower. Diet exercise and weight loss.   ______

## 2013-06-20 ENCOUNTER — Other Ambulatory Visit: Payer: Self-pay | Admitting: Interventional Cardiology

## 2013-07-11 ENCOUNTER — Encounter: Payer: Self-pay | Admitting: *Deleted

## 2013-07-20 ENCOUNTER — Other Ambulatory Visit: Payer: Self-pay | Admitting: Interventional Cardiology

## 2013-07-29 ENCOUNTER — Other Ambulatory Visit: Payer: Self-pay | Admitting: Interventional Cardiology

## 2013-08-27 ENCOUNTER — Other Ambulatory Visit: Payer: Self-pay | Admitting: Interventional Cardiology

## 2013-09-15 ENCOUNTER — Ambulatory Visit: Payer: No Typology Code available for payment source | Admitting: Family Medicine

## 2014-02-26 ENCOUNTER — Ambulatory Visit (INDEPENDENT_AMBULATORY_CARE_PROVIDER_SITE_OTHER): Payer: No Typology Code available for payment source | Admitting: *Deleted

## 2014-02-26 DIAGNOSIS — R739 Hyperglycemia, unspecified: Secondary | ICD-10-CM

## 2014-02-26 DIAGNOSIS — E8881 Metabolic syndrome: Secondary | ICD-10-CM

## 2014-02-26 DIAGNOSIS — I10 Essential (primary) hypertension: Secondary | ICD-10-CM

## 2014-02-26 DIAGNOSIS — R7309 Other abnormal glucose: Secondary | ICD-10-CM

## 2014-02-26 LAB — HEPATIC FUNCTION PANEL
ALBUMIN: 4.3 g/dL (ref 3.5–5.2)
ALK PHOS: 53 U/L (ref 39–117)
ALT: 43 U/L (ref 0–53)
AST: 32 U/L (ref 0–37)
BILIRUBIN DIRECT: 0 mg/dL (ref 0.0–0.3)
TOTAL PROTEIN: 7.1 g/dL (ref 6.0–8.3)
Total Bilirubin: 0.7 mg/dL (ref 0.2–1.2)

## 2014-02-26 LAB — LIPID PANEL
Cholesterol: 149 mg/dL (ref 0–200)
HDL: 43.4 mg/dL (ref >39.00)
LDL Cholesterol: 85 mg/dL (ref 0–99)
NonHDL: 105.6
TRIGLYCERIDES: 105 mg/dL (ref 0.0–149.0)
Total CHOL/HDL Ratio: 3
VLDL: 21 mg/dL (ref 0.0–40.0)

## 2014-03-02 ENCOUNTER — Ambulatory Visit (INDEPENDENT_AMBULATORY_CARE_PROVIDER_SITE_OTHER): Payer: No Typology Code available for payment source | Admitting: Interventional Cardiology

## 2014-03-02 ENCOUNTER — Telehealth: Payer: Self-pay

## 2014-03-02 ENCOUNTER — Encounter: Payer: Self-pay | Admitting: Interventional Cardiology

## 2014-03-02 VITALS — BP 112/72 | HR 70 | Ht 71.0 in | Wt 249.4 lb

## 2014-03-02 DIAGNOSIS — E785 Hyperlipidemia, unspecified: Secondary | ICD-10-CM

## 2014-03-02 DIAGNOSIS — I10 Essential (primary) hypertension: Secondary | ICD-10-CM

## 2014-03-02 DIAGNOSIS — I059 Rheumatic mitral valve disease, unspecified: Secondary | ICD-10-CM

## 2014-03-02 DIAGNOSIS — I2510 Atherosclerotic heart disease of native coronary artery without angina pectoris: Secondary | ICD-10-CM

## 2014-03-02 DIAGNOSIS — I340 Nonrheumatic mitral (valve) insufficiency: Secondary | ICD-10-CM | POA: Insufficient documentation

## 2014-03-02 HISTORY — DX: Hyperlipidemia, unspecified: E78.5

## 2014-03-02 NOTE — Progress Notes (Signed)
Patient ID: Steven Mckee, male   DOB: Mar 05, 1951, 63 y.o.   MRN: [id number]    [address], [place]  [address] Phone: [phone] Fax:  [phone]  Date:  03/02/2014   ID:  Steven Mckee, DOB [birth year], MRN [id number]  PCP:  Anthoney Harada, MD   ASSESSMENT:  1. Coronary artery disease with prior inferior myocardial infarction and RCA total occlusion and no moderate LAD disease, 2. Mitral regurgitation, mild to moderate by clinical exam 3. Hypertension, under control 4. Hyperlipidemia with LDL of 85 on laboratory work done within the past 2 weeks.  PLAN:  1. 2-D Doppler echocardiogram to reassess LV function and severity of mitral regurgitation 2. Continue the current medical regimen 3. Increase aerobic activity to help with weight control. Decreased carbohydrate intake. 4. Call if angina pectoris 5. Followup in one year   SUBJECTIVE: Steven Mckee is a 63 y.o. male who is doing relatively well. He has been troubled by hip bursitis. This is decreased his exertional activities significantly. He denies angina. He denies orthopnea and PND. No prolonged palpitations, syncope, or transient neurological complaints.   Wt Readings from Last 3 Encounters:  03/02/14 249 lb 6.4 oz (113.127 kg)  06/15/13 252 lb 9.6 oz (114.579 kg)  03/05/13 251 lb 6.4 oz (114.034 kg)     Past Medical History  Diagnosis Date  . Coronary artery disease   . Mitral valve regurgitation   . Obesity   . Metabolic syndrome   . Obesity   . Hypercholesteremia   . CAD (coronary artery disease)   . Low testosterone     Current Outpatient Prescriptions  Medication Sig Dispense Refill  . aspirin 325 MG tablet Take 325 mg by mouth daily.      . imiquimod (ALDARA) 5 % cream Apply topically as needed. FOR SKIN      . lisinopril-hydrochlorothiazide (PRINZIDE,ZESTORETIC) 20-12.5 MG per tablet TAKE 1 TABLET BY MOUTH EVERY DAY  30 tablet  10  . metoprolol succinate (TOPROL-XL)  25 MG 24 hr tablet TAKE 1 TABLET BY MOUTH EVERY DAY.  30 tablet  6  . rosuvastatin (CRESTOR) 5 MG tablet Take 5 mg by mouth daily.       No current facility-administered medications for this visit.    Allergies:   No Known Allergies  Social History:  The patient  reports that he quit smoking about 4 years ago. He does not have any smokeless tobacco history on file.   ROS:  Please see the history of present illness.   Has not had syncope. No peripheral edema.   All other systems reviewed and negative.   OBJECTIVE: VS:  BP 112/72  Pulse 70  Ht 5\' 11"  (1.803 m)  Wt 249 lb 6.4 oz (113.127 kg)  BMI 34.80 kg/m2 Well nourished, well developed, in no acute distress, obese HEENT: normal Neck: JVD flat. Carotid bruit absent  Cardiac:  normal S1, S2; RRR; 2 of 6 apical systolic murmur with radiation into the left axilla. Lungs:  clear to auscultation bilaterally, no wheezing, rhonchi or rales Abd: soft, nontender, no hepatomegaly Ext: Edema absent. Pulses 2+ Skin: warm and dry Neuro:  CNs 2-12 intact, no focal abnormalities noted  EKG:  Inferior infarct, normal sinus rhythm, left atrial abnormality, first-degree AV block       Signed, Illene Labrador III, MD 03/02/2014 12:36 PM

## 2014-03-02 NOTE — Patient Instructions (Addendum)
Your physician recommends that you continue on your current medications as directed. Please refer to the Current Medication list given to you today.  Your physician has requested that you have an echocardiogram. Echocardiography is a painless test that uses sound waves to create images of your heart. It provides your doctor with information about the size and shape of your heart and how well your heart's chambers and valves are working. This procedure takes approximately one hour. There are no restrictions for this procedure.  Your physician recommends that you return for a FASTING lipid profile and alt in 1 year  Your physician wants you to follow-up in: 1 year  With Steven Mckee receive a reminder letter in the mail two months in advance. If you don't receive a letter, please call our office to schedule the follow-up appointment.

## 2014-03-02 NOTE — Telephone Encounter (Signed)
called  to assist pt with my chart log in. pt sts that he is ok and is able to log on

## 2014-03-03 ENCOUNTER — Ambulatory Visit (HOSPITAL_COMMUNITY): Payer: No Typology Code available for payment source | Attending: Interventional Cardiology | Admitting: Radiology

## 2014-03-03 DIAGNOSIS — Z87891 Personal history of nicotine dependence: Secondary | ICD-10-CM | POA: Insufficient documentation

## 2014-03-03 DIAGNOSIS — I340 Nonrheumatic mitral (valve) insufficiency: Secondary | ICD-10-CM

## 2014-03-03 DIAGNOSIS — I10 Essential (primary) hypertension: Secondary | ICD-10-CM | POA: Insufficient documentation

## 2014-03-03 DIAGNOSIS — I059 Rheumatic mitral valve disease, unspecified: Secondary | ICD-10-CM | POA: Diagnosis not present

## 2014-03-03 DIAGNOSIS — E785 Hyperlipidemia, unspecified: Secondary | ICD-10-CM | POA: Insufficient documentation

## 2014-03-03 DIAGNOSIS — E669 Obesity, unspecified: Secondary | ICD-10-CM | POA: Diagnosis not present

## 2014-03-03 NOTE — Progress Notes (Signed)
Echocardiogram performed.  

## 2014-03-05 ENCOUNTER — Telehealth: Payer: Self-pay

## 2014-03-05 NOTE — Telephone Encounter (Signed)
pt aware or lab and echo results. -labs.At target. Recheck 1 year -echo.Heart function is normal and mitral valve leak is mild to mod. This is as good or better than prior pt verbalized understanding.

## 2014-03-16 ENCOUNTER — Other Ambulatory Visit: Payer: Self-pay | Admitting: Interventional Cardiology

## 2014-04-13 ENCOUNTER — Other Ambulatory Visit: Payer: Self-pay | Admitting: Interventional Cardiology

## 2014-12-24 ENCOUNTER — Other Ambulatory Visit: Payer: Self-pay | Admitting: Interventional Cardiology

## 2015-02-20 ENCOUNTER — Other Ambulatory Visit: Payer: Self-pay | Admitting: Interventional Cardiology

## 2015-03-09 ENCOUNTER — Other Ambulatory Visit: Payer: No Typology Code available for payment source

## 2015-03-10 ENCOUNTER — Telehealth: Payer: Self-pay

## 2015-03-10 NOTE — Telephone Encounter (Signed)
Pt mailed copies of his labs that were recently done by his pcp. Adv pt that we received them and Gaetano have Dr.Smithb review for pt upcoming appt on 10/4

## 2015-03-15 ENCOUNTER — Encounter: Payer: Self-pay | Admitting: Interventional Cardiology

## 2015-03-15 ENCOUNTER — Ambulatory Visit (INDEPENDENT_AMBULATORY_CARE_PROVIDER_SITE_OTHER): Payer: Managed Care, Other (non HMO) | Admitting: Interventional Cardiology

## 2015-03-15 VITALS — BP 108/66 | HR 65 | Ht 70.0 in | Wt 261.1 lb

## 2015-03-15 DIAGNOSIS — I340 Nonrheumatic mitral (valve) insufficiency: Secondary | ICD-10-CM

## 2015-03-15 DIAGNOSIS — E785 Hyperlipidemia, unspecified: Secondary | ICD-10-CM | POA: Diagnosis not present

## 2015-03-15 DIAGNOSIS — I2510 Atherosclerotic heart disease of native coronary artery without angina pectoris: Secondary | ICD-10-CM | POA: Diagnosis not present

## 2015-03-15 DIAGNOSIS — I10 Essential (primary) hypertension: Secondary | ICD-10-CM | POA: Diagnosis not present

## 2015-03-15 NOTE — Progress Notes (Signed)
Cardiology Office Note   Date:  03/15/2015   ID:  Steven Mckee, DOB December 04, 1950, MRN [id number]  PCP:  Anthoney Harada, MD  Cardiologist:  Sinclair Grooms, MD   Chief Complaint  Patient presents with  . Coronary Artery Disease      History of Present Illness: Steven Mckee is a 64 y.o. 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  [name] is doing well. He denies angina. He is not limited by dyspnea. He has not had palpitations or syncope. Overall he feels improved compared with a year ago.    Past Medical History  Diagnosis Date  . Coronary artery disease   . Mitral valve regurgitation   . Obesity   . Metabolic syndrome   . Obesity   . Hypercholesteremia   . CAD (coronary artery disease)   . Low testosterone   . HTN (hypertension) 10/28/2012  . Mitral valvular regurgitation 03/02/2014    Ischemic etiology secondary to an papillary muscle this   . Hyperlipidemia 03/02/2014    Past Surgical History  Procedure Laterality Date  . Skin surgery       Current Outpatient Prescriptions  Medication Sig Dispense Refill  . aspirin 325 MG tablet Take 325 mg by mouth daily.    . imiquimod (ALDARA) 5 % cream Apply topically as needed. FOR SKIN    . lisinopril-hydrochlorothiazide (PRINZIDE,ZESTORETIC) 20-12.5 MG per tablet TAKE 1 TABLET BY MOUTH EVERY DAY 30 tablet 11  . metoprolol succinate (TOPROL-XL) 25 MG 24 hr tablet TAKE 1 TABLET BY MOUTH EVERY DAY 30 tablet 0  . rosuvastatin (CRESTOR) 5 MG tablet TAKE 1 TABLET BY MOUTH EVERY DAY. 90 tablet 0   No current facility-administered medications for this visit.    Allergies:   Review of patient's allergies indicates no known allergies.    Social History:  The patient  reports that he quit smoking about 6 years ago. He has never used smokeless  tobacco.   Family History:  The patient's family history includes Dementia in his mother; Heart disease in his father; Stroke in his father.    ROS:  Please see the history of present illness.   Otherwise, review of systems are positive for erectile dysfunction. Occasional rash. Increase in appetite..   All other systems are reviewed and negative.    PHYSICAL EXAM: VS:  BP 108/66 mmHg  Pulse 65  Ht 5\' 10"  (1.778 m)  Wt 118.443 kg (261 lb 1.9 oz)  BMI 37.47 kg/m2 , BMI Body mass index is 37.47 kg/(m^2). GEN: Well nourished, well developed, in no acute distress. Obese HEENT: normal Neck: no JVD, carotid bruits, or masses Cardiac: RRR.  There is a grade 4-0/9 apical systolic murmur; No rub, or gallop. There is no edema. Respiratory:  clear to auscultation bilaterally, normal work of breathing. GI: soft, nontender, nondistended, + BS MS: no deformity or atrophy Skin: warm and dry, no rash Neuro:  Strength and sensation are intact Psych: euthymic mood, full affect   EKG:  EKG is ordered today. The ekg reveals normal sinus rhythm with old inferior infarct unchanged from prior   Recent Labs: No results found for requested labs within last 365 days.    Lipid Panel    Component Value Date/Time   CHOL 149 02/26/2014 0756   CHOL 126 11/13/2012 0841   TRIG 105.0 02/26/2014 0756   TRIG 68 06/15/2013  0856   TRIG 112 11/13/2012 0841   HDL 43.40 02/26/2014 0756   HDL 61 06/15/2013 0856   HDL 46 11/13/2012 0841   CHOLHDL 3 02/26/2014 0756   VLDL 21.0 02/26/2014 0756   LDLCALC 85 02/26/2014 0756   LDLCALC 78 06/15/2013 0856   LDLCALC 58 11/13/2012 0841      Wt Readings from Last 3 Encounters:  03/15/15 118.443 kg (261 lb 1.9 oz)  03/02/14 113.127 kg (249 lb 6.4 oz)  06/15/13 114.579 kg (252 lb 9.6 oz)      Other studies Reviewed: Additional studies/ records that were reviewed today include: Reviewed last echo done one year ago which was without evidence of LV enlargement  and preserved LV function was noted.. The findings include moderate mitral regurgitation by the most recent echo.    ASSESSMENT AND PLAN:  1. Coronary artery disease involving native coronary artery of native heart without angina pectoris Asymptomatic without angina  2. Essential hypertension Excellent control  3. Mitral valvular regurgitation Chronic asymptomatic mitral regurgitation likely related to ischemic heart disease and papillary muscle dysfunction.  4. Hyperlipidemia Followed by primary care    Current medicines are reviewed at length with the patient today.  The patient has the following concerns regarding medicines: We discussed the use of PDE 5 inhibitor therapy..  The following changes/actions have been instituted:    There is no contraindication to PDE 5 therapy  Weight loss but decreased caloric intake  2-D Doppler echocardiogram on return visit  Labs/ tests ordered today include:   Orders Placed This Encounter  Procedures  . EKG 12-Lead  . Echocardiogram     Disposition:   FU with HS in 1 year  Signed, Sinclair Grooms, MD  03/15/2015 9:56 AM    [facility] [place] [address], [place], [place]  [address] Phone: [phone]; Fax: [phone]

## 2015-03-15 NOTE — Patient Instructions (Signed)
Medication Instructions:  Your physician recommends that you continue on your current medications as directed. Please refer to the Current Medication list given to you today.   Labwork: None ordered  Testing/Procedures: Your physician has requested that you have an echocardiogram. Echocardiography is a painless test that uses sound waves to create images of your heart. It provides your doctor with information about the size and shape of your heart and how well your heart's chambers and valves are working. This procedure takes approximately one hour. There are no restrictions for this procedure. (To be scheduled sometime between now and your next office visit)  Follow-Up: Your physician wants you to follow-up in: 1 year with Dr.Smith You Leslee receive a reminder letter in the mail two months in advance. If you don't receive a letter, please call our office to schedule the follow-up appointment.   Any Other Special Instructions Zadrian Be Listed Below (If Applicable).

## 2015-03-22 ENCOUNTER — Other Ambulatory Visit: Payer: Self-pay | Admitting: Interventional Cardiology

## 2015-04-20 ENCOUNTER — Other Ambulatory Visit: Payer: Self-pay | Admitting: Interventional Cardiology

## 2016-02-17 ENCOUNTER — Ambulatory Visit: Payer: Managed Care, Other (non HMO) | Admitting: Interventional Cardiology

## 2016-02-20 ENCOUNTER — Other Ambulatory Visit (HOSPITAL_COMMUNITY): Payer: Managed Care, Other (non HMO)

## 2016-03-21 ENCOUNTER — Other Ambulatory Visit: Payer: Self-pay | Admitting: Interventional Cardiology

## 2016-04-17 ENCOUNTER — Other Ambulatory Visit: Payer: Self-pay | Admitting: Interventional Cardiology

## 2016-04-25 ENCOUNTER — Ambulatory Visit (HOSPITAL_COMMUNITY): Payer: Managed Care, Other (non HMO)

## 2016-04-25 ENCOUNTER — Encounter (HOSPITAL_COMMUNITY): Payer: Self-pay

## 2016-04-25 ENCOUNTER — Telehealth: Payer: Self-pay | Admitting: Interventional Cardiology

## 2016-04-25 NOTE — Telephone Encounter (Signed)
Pt found out Echo was going to cost him $1800.  Pt states in a couple of months he Steven Mckee have Medicare and he would like to do echo at that time.  Advised pt that Vernis be fine.  Pt appreciative for assistance.

## 2016-04-25 NOTE — Telephone Encounter (Signed)
Pt called in about his Echo appt that he canceled and he goes under medicare in a Month.    Pt would like a call back please.

## 2016-05-10 ENCOUNTER — Encounter: Payer: Self-pay | Admitting: Interventional Cardiology

## 2016-05-10 ENCOUNTER — Encounter: Payer: Self-pay | Admitting: *Deleted

## 2016-05-10 ENCOUNTER — Ambulatory Visit (INDEPENDENT_AMBULATORY_CARE_PROVIDER_SITE_OTHER): Payer: Managed Care, Other (non HMO) | Admitting: Interventional Cardiology

## 2016-05-10 VITALS — BP 100/60 | HR 67 | Ht 70.0 in | Wt 265.8 lb

## 2016-05-10 DIAGNOSIS — E785 Hyperlipidemia, unspecified: Secondary | ICD-10-CM

## 2016-05-10 DIAGNOSIS — I10 Essential (primary) hypertension: Secondary | ICD-10-CM | POA: Diagnosis not present

## 2016-05-10 DIAGNOSIS — I340 Nonrheumatic mitral (valve) insufficiency: Secondary | ICD-10-CM | POA: Diagnosis not present

## 2016-05-10 DIAGNOSIS — I2510 Atherosclerotic heart disease of native coronary artery without angina pectoris: Secondary | ICD-10-CM

## 2016-05-10 MED ORDER — METOPROLOL SUCCINATE ER 25 MG PO TB24: 25.0000 mg | ORAL_TABLET | Freq: Every day | ORAL | 3 refills | Status: DC

## 2016-05-10 MED ORDER — ROSUVASTATIN CALCIUM 5 MG PO TABS: 5.0000 mg | ORAL_TABLET | Freq: Every day | ORAL | 3 refills | Status: DC

## 2016-05-10 NOTE — Patient Instructions (Addendum)
Medication Instructions:  None  Labwork: Lipid and liver at time of echo.  Testing/Procedures: Your physician has requested that you have an echocardiogram in Spring 2018. Echocardiography is a painless test that uses sound waves to create images of your heart. It provides your doctor with information about the size and shape of your heart and how well your heart's chambers and valves are working. This procedure takes approximately one hour. There are no restrictions for this procedure.    Follow-Up: Your physician wants you to follow-up in: 1 year with Dr. Tamala Julian.  You Steven Mckee receive a reminder letter in the mail two months in advance. If you don't receive a letter, please call our office to schedule the follow-up appointment.   Any Other Special Instructions Steven Mckee Be Listed Below (If Applicable).     If you need a refill on your cardiac medications before your next appointment, please call your pharmacy.

## 2016-05-10 NOTE — Progress Notes (Signed)
Cardiology Office Note    Date:  05/10/2016   ID:  Steven Mckee, DOB [birth year], MRN [id number]  PCP:  Anthoney Harada, MD  Cardiologist: Sinclair Grooms, MD   Chief Complaint  Patient presents with  . Coronary Artery Disease  . Cardiac Valve Problem    History of Present Illness:  Steven Mckee is a 65 y.o. 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  He is doing well. He denies orthopnea, PND, chest discomfort. He has chronic dependent bilateral lower extremity edema. This is unchanged. He has gained weight. He is not exercising.   Past Medical History:  Diagnosis Date  . CAD (coronary artery disease)   . Coronary artery disease   . HTN (hypertension) 10/28/2012  . Hypercholesteremia   . Hyperlipidemia 03/02/2014  . Low testosterone   . Metabolic syndrome   . Mitral valve regurgitation   . 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 Obesity   . Obesity     Past Surgical History:  Procedure Laterality Date  . SKIN SURGERY      Current Medications: Outpatient Medications Prior to Visit  Medication Sig Dispense Refill  . aspirin 325 MG tablet Take 325 mg by mouth daily.    . imiquimod (ALDARA) 5 % cream Apply 1 application topically as needed. FOR SKIN    . lisinopril-hydrochlorothiazide (PRINZIDE,ZESTORETIC) 20-12.5 MG tablet TAKE 1 TABLET BY MOUTH EVERY DAY 30 tablet 0  . metoprolol succinate (TOPROL-XL) 25 MG 24 hr tablet Take 1 tablet (25 mg total) by mouth daily. 90 tablet 0  . rosuvastatin (CRESTOR) 5 MG tablet Take 1 tablet (5 mg total) by mouth daily. 90 tablet 0  . metoprolol succinate (TOPROL-XL) 25 MG 24 hr tablet TAKE 1 TABLET BY MOUTH EVERY DAY (Patient not taking: Reported on 05/10/2016) 30 tablet 0  . rosuvastatin  (CRESTOR) 5 MG tablet TAKE 1 TABLET BY MOUTH EVERY DAY (Patient not taking: Reported on 05/10/2016) 90 tablet 3   No facility-administered medications prior to visit.      Allergies:   Patient has no known allergies.   Social History   Social History  . Marital status: Single    Spouse name: N/A  . Number of children: N/A  . Years of education: N/A   Social History Main Topics  . Smoking status: Former Smoker    Quit date: 03/05/2009  . Smokeless tobacco: Never Used  . Alcohol use None  . Drug use: Unknown  . Sexual activity: Not Asked   Other Topics Concern  . None   Social History Narrative  . None     Family History:  The patient's family history includes Dementia in his mother; Heart disease in his father; Stroke in his father.   ROS:   Please see the history of present illness.    He snores. He denies excessive daytime sleepiness. He has a: At least once a year. During these times he feels chest congestion.  All other systems reviewed and are negative.   PHYSICAL EXAM:   VS:  BP 100/60 (BP Location: Left Arm)   Pulse 67   Ht 5\' 10"  (1.778 m)   Wt 265 lb 12.8 oz (120.6 kg)   BMI 38.14 kg/m    GEN: Well nourished, well developed, in no acute distress  HEENT: normal  Neck:  no JVD, carotid bruits, or masses Cardiac: RRR; 2/6 systolic murmur. There are no rub or gallops. There is 2+ bilateral pitting edema at the ankles.  Respiratory:  clear to auscultation bilaterally, normal work of breathing GI: soft, nontender, nondistended, + BS MS: no deformity or atrophy  Skin: warm and dry, no rash Neuro:  Alert and Oriented x 3, Strength and sensation are intact Psych: euthymic mood, full affect  Wt Readings from Last 3 Encounters:  05/10/16 265 lb 12.8 oz (120.6 kg)  03/15/15 261 lb 1.9 oz (118.4 kg)  03/02/14 249 lb 6.4 oz (113.1 kg)      Studies/Labs Reviewed:   EKG:  EKG  Normal sinus rhythm, inferior Q waves, first degree AV block measuring 324 ms. No  change when compared to prior tracings. The atrial abnormality is noted.  Recent Labs: No results found for requested labs within last 8760 hours.   Lipid Panel    Component Value Date/Time   CHOL 149 02/26/2014 0756   CHOL 126 11/13/2012 0841   TRIG 105.0 02/26/2014 0756   TRIG 68 06/15/2013 0856   TRIG 112 11/13/2012 0841   HDL 43.40 02/26/2014 0756   HDL 61 06/15/2013 0856   HDL 46 11/13/2012 0841   CHOLHDL 3 02/26/2014 0756   VLDL 21.0 02/26/2014 0756   LDLCALC 85 02/26/2014 0756   LDLCALC 78 06/15/2013 0856   LDLCALC 58 11/13/2012 0841    Additional studies/ records that were reviewed today include:  No new data. He did not have the echo done this year as we had planned. The 2015 2 Doppler echocardiogram: Study Conclusions  - Left ventricle: The cavity size was normal. There was mild focal basal hypertrophy of the septum. Systolic function was normal. The estimated ejection fraction was in the range of 60% to 65%. Wall motion was normal; there were no regional wall motion abnormalities. Doppler parameters are consistent with abnormal left ventricular relaxation (grade 1 diastolic dysfunction). - Mitral valve: Moderate prolapse, involving the posterior leaflet. There was mild to moderate regurgitation directed eccentrically and anteriorly. - Left atrium: The atrium was mildly dilated.  ASSESSMENT:    1. Non-rheumatic mitral regurgitation   2. Essential hypertension   3. Coronary artery disease involving native coronary artery of native heart without angina pectoris   4. Hyperlipidemia, unspecified hyperlipidemia type      PLAN:  In order of problems listed above:  1. Clinically, the mitral regurgitation related to ischemic heart disease seems to be stable. We'll plan to perform an echocardiogram this spring. 2. Excellent control. I encouraged weight loss and aerobic activity. 3. Asymptomatic with reference to any p no recent functional studies.  We Kodie consider this at some point in the future. There are no symptoms to suggest angina. Or progression of coronary disease. 4. LDL cholesterol should be kept at or below 70. This was discussed with the patient.     Medication Adjustments/Labs and Tests Ordered: Current medicines are reviewed at length with the patient today.  Concerns regarding medicines are outlined above.  Medication changes, Labs and Tests ordered today are listed in the Patient Instructions below. Patient Instructions  Medication Instructions:  None  Labwork: None  Testing/Procedures: Your physician has requested that you have an echocardiogram in Spring 2018. Echocardiography is a painless test that uses sound waves to create images of your heart. It provides your doctor with information about the size and shape of your heart and how well your heart's chambers and valves are working.  This procedure takes approximately one hour. There are no restrictions for this procedure.    Follow-Up: Your physician wants you to follow-up in: 1 year with Dr. Tamala Julian.  You Massimo receive a reminder letter in the mail two months in advance. If you don't receive a letter, please call our office to schedule the follow-up appointment.   Any Other Special Instructions Rue Be Listed Below (If Applicable).     If you need a refill on your cardiac medications before your next appointment, please call your pharmacy.      Signed, Sinclair Grooms, MD  05/10/2016 12:33 PM    [facility] [place], [place], [place]  [address] Phone: [phone]; Fax: [phone]

## 2016-05-10 NOTE — Addendum Note (Signed)
Addended by: Loren Racer on: 05/10/2016 12:43 PM   Modules accepted: Orders

## 2016-05-14 ENCOUNTER — Other Ambulatory Visit: Payer: Self-pay | Admitting: Interventional Cardiology

## 2016-11-21 ENCOUNTER — Other Ambulatory Visit: Payer: Managed Care, Other (non HMO)

## 2016-11-21 ENCOUNTER — Other Ambulatory Visit (HOSPITAL_COMMUNITY): Payer: Managed Care, Other (non HMO)

## 2017-05-07 ENCOUNTER — Other Ambulatory Visit: Payer: Self-pay | Admitting: *Deleted

## 2017-05-07 DIAGNOSIS — I340 Nonrheumatic mitral (valve) insufficiency: Secondary | ICD-10-CM

## 2017-05-11 ENCOUNTER — Other Ambulatory Visit: Payer: Self-pay | Admitting: Interventional Cardiology

## 2017-05-20 ENCOUNTER — Other Ambulatory Visit: Payer: Self-pay | Admitting: Interventional Cardiology

## 2017-06-09 ENCOUNTER — Other Ambulatory Visit: Payer: Self-pay | Admitting: Interventional Cardiology

## 2017-06-10 ENCOUNTER — Other Ambulatory Visit: Payer: Self-pay | Admitting: Interventional Cardiology

## 2017-06-10 MED ORDER — ROSUVASTATIN CALCIUM 5 MG PO TABS: 5.0000 mg | ORAL_TABLET | Freq: Every day | ORAL | 0 refills | Status: DC

## 2017-06-10 MED ORDER — LISINOPRIL-HYDROCHLOROTHIAZIDE 20-12.5 MG PO TABS: 1.0000 | ORAL_TABLET | Freq: Every day | ORAL | 0 refills | Status: DC

## 2017-06-10 MED ORDER — METOPROLOL SUCCINATE ER 25 MG PO TB24: 25.0000 mg | ORAL_TABLET | Freq: Every day | ORAL | 0 refills | Status: DC

## 2017-06-10 NOTE — Telephone Encounter (Signed)
Pt's medication was sent to pt's pharmacy as requested. Confirmation received.  °

## 2017-06-10 NOTE — Telephone Encounter (Signed)
°*  STAT* If patient is at the pharmacy, call can be transferred to refill team.   1. Which medications need to be refilled? (please list name of each medication and dose if known) Lisinopril,Rosuvastatin and Metoprolol  2. Which pharmacy/location (including street and city if local pharmacy) is medication to be sent to?[facility]-[phone]  3. Do they need a 30 day or 90 day supply? 90 and refills

## 2017-06-18 ENCOUNTER — Other Ambulatory Visit: Payer: Self-pay

## 2017-06-18 ENCOUNTER — Ambulatory Visit (HOSPITAL_COMMUNITY): Payer: Medicare Other | Attending: Cardiology

## 2017-06-18 DIAGNOSIS — I2510 Atherosclerotic heart disease of native coronary artery without angina pectoris: Secondary | ICD-10-CM | POA: Insufficient documentation

## 2017-06-18 DIAGNOSIS — E785 Hyperlipidemia, unspecified: Secondary | ICD-10-CM | POA: Insufficient documentation

## 2017-06-18 DIAGNOSIS — I340 Nonrheumatic mitral (valve) insufficiency: Secondary | ICD-10-CM | POA: Diagnosis not present

## 2017-06-18 DIAGNOSIS — I10 Essential (primary) hypertension: Secondary | ICD-10-CM | POA: Insufficient documentation

## 2017-06-27 ENCOUNTER — Ambulatory Visit: Payer: Medicare Other | Admitting: Interventional Cardiology

## 2017-06-27 ENCOUNTER — Encounter: Payer: Self-pay | Admitting: Interventional Cardiology

## 2017-06-27 VITALS — BP 124/60 | HR 67 | Ht 70.0 in | Wt 268.0 lb

## 2017-06-27 DIAGNOSIS — E782 Mixed hyperlipidemia: Secondary | ICD-10-CM

## 2017-06-27 DIAGNOSIS — I25119 Atherosclerotic heart disease of native coronary artery with unspecified angina pectoris: Secondary | ICD-10-CM

## 2017-06-27 DIAGNOSIS — I340 Nonrheumatic mitral (valve) insufficiency: Secondary | ICD-10-CM | POA: Diagnosis not present

## 2017-06-27 MED ORDER — ASPIRIN EC 81 MG PO TBEC: 81.0000 mg | DELAYED_RELEASE_TABLET | Freq: Every day | ORAL | 3 refills | Status: AC

## 2017-06-27 NOTE — Patient Instructions (Addendum)
Medication Instructions:  1) DECREASE Aspirin to 81mg  once daily  Labwork: None  Testing/Procedures: Your physician has requested that you have en exercise stress myoview in 6 months or later. For further information please visit [email or web address]. Please follow instruction sheet, as given.    Follow-Up: Your physician wants you to follow-up in: 1 year with Dr. Tamala Julian.  You Zen receive a reminder letter in the mail two months in advance. If you don't receive a letter, please call our office to schedule the follow-up appointment.   Any Other Special Instructions Fate Be Listed Below (If Applicable).     If you need a refill on your cardiac medications before your next appointment, please call your pharmacy.

## 2017-06-27 NOTE — Progress Notes (Signed)
Cardiology Office Note    Date:  06/27/2017   ID:  Steven Mckee, DOB [birth year], MRN [id number]  PCP:  [name], MD  Cardiologist: Sinclair Grooms, MD   Chief Complaint  Patient presents with  . Coronary Artery Disease  . Cardiac Valve Problem    History of Present Illness:  Steven Mckee is a 67 y.o. 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   Steven Mckee is doing well.  He continues to have periods of right scapula and shoulder discomfort that can last days.  Not exacerbated by physical activity.  No associated chest pain or shortness of breath.  None currently.  When it is present it is noticeable but not limiting.  There is no orthopnea, PND, chest pain, or episodes of syncope.  He has begun noticing bilateral lower extremity swelling.  It was quite striking several weeks ago but that has gone back to a more baseline appearance.   Past Medical History:  Diagnosis Date  . CAD (coronary artery disease)   . Coronary artery disease   . HTN (hypertension) 10/28/2012  . Hypercholesteremia   . Hyperlipidemia 03/02/2014  . Low testosterone   . Metabolic syndrome   . Mitral valve regurgitation   . 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 Obesity   . Obesity     Past Surgical History:  Procedure Laterality Date  . SKIN SURGERY      Current Medications: Outpatient Medications Prior to Visit  Medication Sig Dispense Refill  . aspirin 325 MG tablet Take 325 mg by mouth daily.    . halobetasol (ULTRAVATE) 0.05 % ointment Apply 1 application topically daily as needed. (contact dermatitis)    . imiquimod (ALDARA) 5 % cream Apply 1 application topically as needed. FOR SKIN    . lisinopril-hydrochlorothiazide (PRINZIDE,ZESTORETIC) 20-12.5 MG  tablet Take 1 tablet by mouth daily. Please keep upcoming appt for future refills. Thank you 30 tablet 0  . metoprolol succinate (TOPROL-XL) 25 MG 24 hr tablet Take 1 tablet (25 mg total) by mouth daily. Please keep upcoming appt for future refills. Thank you 30 tablet 0  . rosuvastatin (CRESTOR) 5 MG tablet Take 1 tablet (5 mg total) by mouth daily. Please keep  Upcoming appt for future refills. Thank you 30 tablet 0   No facility-administered medications prior to visit.      Allergies:   Patient has no known allergies.   Social History   Socioeconomic History  . Marital status: Single    Spouse name: None  . Number of children: None  . Years of education: None  . Highest education level: None  Social Needs  . Financial resource strain: None  . Food insecurity - worry: None  . Food insecurity - inability: None  . Transportation needs - medical: None  . Transportation needs - non-medical: None  Occupational History  . None  Tobacco Use  . Smoking status: Former Smoker    Last attempt to quit: 03/05/2009    Years since quitting: 8.3  . Smokeless tobacco: Never Used  Substance and Sexual Activity  . Alcohol use: None  . Drug use: None  . Sexual activity: None  Other Topics Concern  . None  Social History Narrative  . None     Family History:  The patient's family history  includes Dementia in his mother; Heart disease in his father; Stroke in his father.   ROS:   Please see the history of present illness.    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  Snores at night.  Awakens 2-3 times per night.  Left and right shoulder discomfort occur that can awaken him from sleep.  Last stress evaluation was greater than 5 years ago. All other systems reviewed and are negative.   PHYSICAL EXAM:   VS:  BP 124/60   Pulse 67   Ht 5\' 10"  (1.778 m)   Wt 268 lb (121.6 kg)   BMI 38.45 kg/m    GEN: Well nourished, well developed, in no acute distress  HEENT: normal  Neck: no JVD, carotid  bruits, or masses Cardiac: RRR;  rubs, or gallops,no edema.  There is a 2/6 holosystolic murmur of mitral regurgitation. 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  No change when compared to prior EKG tracings.  Left atrial abnormality with first-degree AV block with PR approximately 280 ms.  Recent Labs: No results found for requested labs within last 8760 hours.   Lipid Panel    Component Value Date/Time   CHOL 149 02/26/2014 0756   CHOL 126 11/13/2012 0841   TRIG 105.0 02/26/2014 0756   TRIG 68 06/15/2013 0856   TRIG 112 11/13/2012 0841   HDL 43.40 02/26/2014 0756   HDL 61 06/15/2013 0856   HDL 46 11/13/2012 0841   CHOLHDL 3 02/26/2014 0756   VLDL 21.0 02/26/2014 0756   LDLCALC 85 02/26/2014 0756   LDLCALC 78 06/15/2013 0856   LDLCALC 58 11/13/2012 0841    Additional studies/ records that were reviewed today include:  2D Doppler echocardiogram performed 2019:  Study Conclusions   - Left ventricle: The cavity size was mildly dilated. There was   moderate focal basal and mild concentric hypertrophy. Systolic   function was normal. The estimated ejection fraction was in the   range of 60% to 65%. Wall motion was normal; there were no   regional wall motion abnormalities. Features are consistent with   a pseudonormal left ventricular filling pattern, with concomitant   abnormal relaxation and increased filling pressure (grade 2   diastolic dysfunction). Doppler parameters are consistent with   high  ventricular filling pressure. - Aortic valve: Trileaflet; normal thickness, mildly calcified   leaflets. - Aorta: Aortic root dimension: 39 mm (ED). - Aortic root: The aortic root was mildly dilated. - Mitral valve: Mild, holosystolicprolapse, involving the posterior   leaflet. There was mild regurgitation. - Left atrium: The atrium was mildly dilated. - Pulmonic valve: There was mild regurgitation. - Pulmonary arteries: Systolic pressure could not be accurately   estimated.     ASSESSMENT:    1. Non-rheumatic mitral regurgitation   2. Mixed hyperlipidemia   3. Coronary artery disease involving native coronary artery of native heart with angina pectoris ([place])      PLAN:  In order of problems listed above:  1. Severity of mitral regurgitation is actually decreased over time on optimal medical therapy.  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  Clinical exam is unremarkable with the exception of a mild systolic murmur.  Echo further demonstrates that heart size is normal and EF is around 60%.  This is an improvement compared to greater than 5 years ago. 2. Last LDL cholesterol was 74 when evaluated by Dr. Jacelyn Grip in March 2018.  This is at target and very good to prevent progression of coronary disease. 3. Stable without angina.  Greater than 5 years since his last stress test.  Arney perform one at some point during this year to rule out any subclinical coronary disease. 4. Obesity is a big issue at this point.  Decrease caloric intake especially as it pertains to refined carbohydrates.  Increase aerobic activity.  Clinical follow-up in 1 year.  I do not believe that the shoulder discomfort is ischemic.  A stress Cardiolite study Franki be done at some point during this year.  Call if chest pain.    Medication Adjustments/Labs and Tests Ordered: Current medicines are reviewed at length with the patient today.  Concerns regarding medicines are outlined above.   Medication changes, Labs and Tests ordered today are listed in the Patient Instructions below. Patient Instructions  Medication Instructions:  Your physician recommends that you continue on your current medications as directed. Please refer to the Current Medication list given to you today.  Labwork: None  Testing/Procedures: None  Follow-Up: Your physician wants you to follow-up in: 1 year with Dr. Tamala Julian.  You Albeiro receive a reminder letter in the mail two months in advance. If you don't receive a letter, please call our office to schedule the follow-up appointment.   Any Other Special Instructions Levin Be Listed Below (If Applicable).     If you need a refill on your cardiac medications before your next appointment, please call your pharmacy.      Signed, Sinclair Grooms, MD  06/27/2017 9:03 AM    [facility] [place], [place], [place]  [address] Phone: [phone]; Fax: [phone]

## 2017-07-09 ENCOUNTER — Other Ambulatory Visit: Payer: Self-pay | Admitting: Interventional Cardiology

## 2018-03-13 ENCOUNTER — Encounter: Payer: Self-pay | Admitting: Dietician

## 2018-03-13 ENCOUNTER — Encounter: Payer: Medicare Other | Attending: Family Medicine | Admitting: Dietician

## 2018-03-13 DIAGNOSIS — E119 Type 2 diabetes mellitus without complications: Secondary | ICD-10-CM

## 2018-03-13 DIAGNOSIS — Z713 Dietary counseling and surveillance: Secondary | ICD-10-CM | POA: Diagnosis not present

## 2018-03-13 NOTE — Progress Notes (Signed)
Patient was seen on 03/13/18 for the first of a series of three diabetes self-management courses at the [facility].  Patient Education Plan per assessed needs and concerns is to attend three course education program for Diabetes Self Management Education.  The following learning objectives were met by the patient during this class:  Describe diabetes  State some common risk factors for diabetes  Defines the role of glucose and insulin  Identifies type of diabetes and pathophysiology  Describe the relationship between diabetes and cardiovascular risk  State the members of the Healthcare Team  States the rationale for glucose monitoring  State when to test glucose  State their individual Target Range  State the importance of logging glucose readings  Describe how to interpret glucose readings  Identifies A1C target  Explain the correlation between A1c and eAG values  State symptoms and treatment of high blood glucose  State symptoms and treatment of low blood glucose  Explain proper technique for glucose testing  Identifies proper sharps disposal  Handouts given during class include:  ADA Diabetes You Take Control   Carb Counting and Meal Planning book  Meal Plan Card  Meal planning worksheet  Low Sodium Flavoring Tips  Types of Fats  The diabetes portion plate  D5H to eAG Conversion Chart  Diabetes Recommended Care Schedule  Support Group  Diabetes Success Plan  Core Class Satisfaction Survey   Follow-Up Plan:  Attend core 2

## 2018-03-20 ENCOUNTER — Encounter: Payer: Medicare Other | Admitting: Dietician

## 2018-03-20 ENCOUNTER — Encounter: Payer: Self-pay | Admitting: Dietician

## 2018-03-20 DIAGNOSIS — Z713 Dietary counseling and surveillance: Secondary | ICD-10-CM | POA: Diagnosis not present

## 2018-03-20 DIAGNOSIS — E119 Type 2 diabetes mellitus without complications: Secondary | ICD-10-CM

## 2018-03-20 NOTE — Progress Notes (Signed)
Patient was seen on 03/20/18 for the second of a series of three diabetes self-management courses at the [facility]. The following learning objectives were met by the patient during this class:   Describe the role of different macronutrients on glucose  Explain how carbohydrates affect blood glucose  State what foods contain the most carbohydrates  Demonstrate carbohydrate counting  Demonstrate how to read Nutrition Facts food label  Describe effects of various fats on heart health  Describe the importance of good nutrition for health and healthy eating strategies  Describe techniques for managing your shopping, cooking and meal planning  List strategies to follow meal plan when dining out  Describe the effects of alcohol on glucose and how to use it safely  Goals:  Follow Diabetes Meal Plan as instructed  Aim to spread carbs evenly throughout the day  Aim for 3 meals per day and snacks as needed Include lean protein foods to meals/snacks  Monitor glucose levels as instructed by your doctor   Follow-Up Plan:  Attend Core 3  Work towards following your personal food plan.   

## 2018-03-27 ENCOUNTER — Encounter: Payer: Medicare Other | Admitting: Dietician

## 2018-03-27 ENCOUNTER — Encounter: Payer: Self-pay | Admitting: Dietician

## 2018-03-27 DIAGNOSIS — E119 Type 2 diabetes mellitus without complications: Secondary | ICD-10-CM

## 2018-03-27 DIAGNOSIS — Z713 Dietary counseling and surveillance: Secondary | ICD-10-CM | POA: Diagnosis not present

## 2018-03-27 NOTE — Progress Notes (Signed)
Patient was seen on 03/27/18 for the third of a series of three diabetes self-management courses at the [facility].   Steven Mckee the amount of activity recommended for healthy living . Describe activities suitable for individual needs . Identify ways to regularly incorporate activity into daily life . Identify barriers to activity and ways to over come these barriers  Identify diabetes medications being personally used and their primary action for lowering glucose and possible side effects . Describe role of stress on blood glucose and develop strategies to address psychosocial issues . Identify diabetes complications and ways to prevent them  Explain how to manage diabetes during illness . Evaluate success in meeting personal goal . Establish 2-3 goals that they Steven Mckee plan to diligently work on  Goals:   I Steven Mckee count my carb choices at most meals and snacks  I Steven Mckee be active 45 minutes or more 5 times a week  I Steven Mckee take my diabetes medications as scheduled  I Steven Mckee eat less unhealthy fats by eating less processed carbs  I Steven Mckee test my glucose at least 3 times a week  Your patient has identified these potential barriers to change:  Motivation  Your patient has identified their diabetes self-care support plan as  On-line Resources    Plan:  Attend Support Group as desired

## 2018-04-22 ENCOUNTER — Telehealth (HOSPITAL_COMMUNITY): Payer: Self-pay

## 2018-04-22 NOTE — Telephone Encounter (Signed)
Detailed message left on the pt's AM. Asked to call back with any questions. S.Mattia Osterman EMTP

## 2018-04-24 ENCOUNTER — Encounter (HOSPITAL_COMMUNITY): Payer: Medicare Other

## 2018-04-30 ENCOUNTER — Other Ambulatory Visit: Payer: Self-pay | Admitting: Interventional Cardiology

## 2018-06-10 ENCOUNTER — Telehealth (HOSPITAL_COMMUNITY): Payer: Self-pay | Admitting: *Deleted

## 2018-06-10 NOTE — Telephone Encounter (Signed)
Left message on voicemail per DPR in reference to upcoming appointment scheduled on 06/17/18 at 0945 with detailed instructions given per Myocardial Perfusion Study Information Sheet for the test. LM to arrive 15 minutes early, and that it is imperative to arrive on time for appointment to keep from having the test rescheduled. If you need to cancel or reschedule your appointment, please call the office within 24 hours of your appointment. Failure to do so may result in a cancellation of your appointment, and a $50 no show fee. Phone number given for call back for any questions. Aryella Besecker, Ranae Palms

## 2018-06-17 ENCOUNTER — Other Ambulatory Visit (HOSPITAL_COMMUNITY): Payer: Self-pay | Admitting: *Deleted

## 2018-06-17 ENCOUNTER — Ambulatory Visit (HOSPITAL_COMMUNITY): Payer: Medicare Other | Attending: Cardiovascular Disease

## 2018-06-17 ENCOUNTER — Telehealth: Payer: Self-pay

## 2018-06-17 DIAGNOSIS — I25119 Atherosclerotic heart disease of native coronary artery with unspecified angina pectoris: Secondary | ICD-10-CM | POA: Diagnosis present

## 2018-06-17 LAB — MYOCARDIAL PERFUSION IMAGING
CHL CUP NUCLEAR SSS: 4
CHL RATE OF PERCEIVED EXERTION: 19
CSEPEDS: 30 s
CSEPEW: 9.3 METS
CSEPPHR: 144 {beats}/min
Exercise duration (min): 7 min
LVDIAVOL: 124 mL (ref 62–150)
LVSYSVOL: 34 mL
MPHR: 154 {beats}/min
NUC STRESS TID: 0.92
Percent HR: 94 %
Rest HR: 64 {beats}/min
SDS: 4
SRS: 0

## 2018-06-17 MED ORDER — TECHNETIUM TC 99M TETROFOSMIN IV KIT
10.0000 | PACK | Freq: Once | INTRAVENOUS | Status: AC | PRN
  Administered 2018-06-17: 10 via INTRAVENOUS
  Filled 2018-06-17: qty 10

## 2018-06-17 MED ORDER — TECHNETIUM TC 99M TETROFOSMIN IV KIT
32.4000 | PACK | Freq: Once | INTRAVENOUS | Status: AC | PRN
  Administered 2018-06-17: 32.4 via INTRAVENOUS
  Filled 2018-06-17: qty 33

## 2018-06-17 MED ORDER — REGADENOSON 0.4 MG/5ML IV SOLN: 0.4000 mg | Freq: Once | INTRAVENOUS | Status: AC

## 2018-06-17 NOTE — Telephone Encounter (Signed)
Patient in today for nuclear stress test. He reported he has been prescribed Metformin 500 mg daily to nuclear stress test RN.  Med list updated.

## 2018-06-18 ENCOUNTER — Other Ambulatory Visit: Payer: Self-pay

## 2018-06-26 ENCOUNTER — Other Ambulatory Visit: Payer: Self-pay | Admitting: Interventional Cardiology

## 2018-06-28 ENCOUNTER — Other Ambulatory Visit: Payer: Self-pay | Admitting: Interventional Cardiology

## 2018-07-03 ENCOUNTER — Encounter: Payer: Self-pay | Admitting: Interventional Cardiology

## 2018-07-04 ENCOUNTER — Other Ambulatory Visit: Payer: Self-pay | Admitting: Interventional Cardiology

## 2018-07-16 ENCOUNTER — Other Ambulatory Visit: Payer: Self-pay | Admitting: Interventional Cardiology

## 2018-07-19 ENCOUNTER — Other Ambulatory Visit: Payer: Self-pay | Admitting: Interventional Cardiology

## 2018-07-21 NOTE — Progress Notes (Signed)
Cardiology Office Note:    Date:  07/22/2018   ID:  Steven Mckee, DOB 04/23/1951, MRN [id number]  PCP:  [name], MD  Cardiologist:  Sinclair Grooms, MD   Referring MD: [name], MD   Chief Complaint  Patient presents with  . Coronary Artery Disease    History of Present Illness:    Steven Mckee is a 68 y.o. 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    Had a difficult time on treadmill.  No chest pain.  States they almost had to support him to get his heart rate up to the target of 144.  He did 7 minutes on a treadmill.  No evidence of ischemia.  He did not have syncope.  Took almost 5 minutes for his breathing to recover.  He is concerned about weight gain.  He has not had angina and denies orthopnea and PND.  Recent imaging as noted below along with a stress test which was low risk.  Past Medical History:  Diagnosis Date  . CAD (coronary artery disease)   . Coronary artery disease   . Diabetes mellitus without complication ([place])   . HTN (hypertension) 10/28/2012  . Hypercholesteremia   . Hyperlipidemia 03/02/2014  . Low testosterone   . Metabolic syndrome   . Mitral valve regurgitation   . 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 Myocardial infarction ([place])   . Obesity   . Obesity     Past Surgical History:  Procedure Laterality Date  . SKIN SURGERY      Current Medications: Current Meds  Medication Sig  . aspirin EC 81 MG tablet Take 1 tablet (81 mg total) by mouth daily.  . halobetasol (ULTRAVATE) 0.05 % ointment Apply 1 application topically daily as needed. (contact dermatitis)  . imiquimod (ALDARA) 5 % cream Apply 1 application topically as needed. FOR SKIN  .  lisinopril-hydrochlorothiazide (PRINZIDE,ZESTORETIC) 20-12.5 MG tablet TAKE 1 TABLET BY MOUTH DAILY  . metFORMIN (GLUCOPHAGE) 500 MG tablet Take 500 mg by mouth daily with breakfast.  . metoprolol succinate (TOPROL-XL) 25 MG 24 hr tablet Take 0.5 tablets (12.5 mg total) by mouth daily.  . rosuvastatin (CRESTOR) 5 MG tablet Take 1 tablet (5 mg total) by mouth daily. Please keep upcoming appt with Dr. Tamala Julian for future refills.  . [DISCONTINUED] metoprolol succinate (TOPROL-XL) 25 MG 24 hr tablet Take 1 tablet (25 mg total) by mouth daily. Please keep upcoming appt in February for future refills. Thank you     Allergies:   Patient has no known allergies.   Social History   Socioeconomic History  . Marital status: Single    Spouse name: Not on file  . Number of children: Not on file  . Years of education: Not on file  . Highest education level: Not on file  Occupational History  . Not on file  Social Needs  . Financial resource strain: Not on file  . Food insecurity:    Worry: Not on file    Inability: Not on file  . Transportation needs:    Medical: Not on file    Non-medical: Not on file  Tobacco Use  . Smoking status: Former Smoker    Last attempt to quit: 03/05/2009    Years since quitting: 9.3  .  Smokeless tobacco: Never Used  Substance and Sexual Activity  . Alcohol use: Never    Alcohol/week: 0.0 standard drinks    Frequency: Never  . Drug use: Never  . Sexual activity: Not on file  Lifestyle  . Physical activity:    Days per week: Not on file    Minutes per session: Not on file  . Stress: Not on file  Relationships  . Social connections:    Talks on phone: Not on file    Gets together: Not on file    Attends religious service: Not on file    Active member of club or organization: Not on file    Attends meetings of clubs or organizations: Not on file    Relationship status: Not on file  Other Topics Concern  . Not on file  Social History Narrative  . Not on  file     Family History: The patient's family history includes Dementia in his mother; Heart disease in his father; Stroke in his father.  ROS:   Please see the history of present illness.    Shortness of breath with heavy activity all other systems reviewed and are negative.  EKGs/Labs/Other Studies Reviewed:    The following studies were reviewed today: Myocardial perfusion imaging 06/17/2018 Study Highlights    Nuclear stress EF: 73%. The left ventricular ejection fraction is hyperdynamic (>65%).  Defect 1: There is a medium defect of mild severity present in the basal inferolateral and mid inferolateral location.  This is a low risk study. There is a mild defect in the inferior lateral wall that may represent a prior subendocardial infarction vs. chest wall attenuation . This region contracts normally   2D Doppler echocardiogram January 2019 Study Conclusions  - Left ventricle: The cavity size was mildly dilated. There was   moderate focal basal and mild concentric hypertrophy. Systolic   function was normal. The estimated ejection fraction was in the   range of 60% to 65%. Wall motion was normal; there were no   regional wall motion abnormalities. Features are consistent with   a pseudonormal left ventricular filling pattern, with concomitant   abnormal relaxation and increased filling pressure (grade 2   diastolic dysfunction). Doppler parameters are consistent with   high ventricular filling pressure. - Aortic valve: Trileaflet; normal thickness, mildly calcified   leaflets. - Aorta: Aortic root dimension: 39 mm (ED). - Aortic root: The aortic root was mildly dilated. - Mitral valve: Mild, holosystolicprolapse, involving the posterior   leaflet. There was mild regurgitation. - Left atrium: The atrium was mildly dilated. - Pulmonic valve: There was mild regurgitation. - Pulmonary arteries: Systolic pressure could not be accurately   estimated.  EKG:  EKG sinus  rhythm, first-degree AV block, 326 ms.  Otherwise normal tracing other than evidence of all inferior Q wave infarction.  Recent Labs: No results found for requested labs within last 8760 hours.  Recent Lipid Panel    Component Value Date/Time   CHOL 149 02/26/2014 0756   CHOL 126 11/13/2012 0841   TRIG 105.0 02/26/2014 0756   TRIG 68 06/15/2013 0856   TRIG 112 11/13/2012 0841   HDL 43.40 02/26/2014 0756   HDL 61 06/15/2013 0856   HDL 46 11/13/2012 0841   CHOLHDL 3 02/26/2014 0756   VLDL 21.0 02/26/2014 0756   LDLCALC 85 02/26/2014 0756   LDLCALC 78 06/15/2013 0856   LDLCALC 58 11/13/2012 0841    Physical Exam:    VS:  BP 108/64  Pulse 75   Ht 5\' 10"  (1.778 m)   Wt 265 lb (120.2 kg)   SpO2 96%   BMI 38.02 kg/m     Wt Readings from Last 3 Encounters:  07/22/18 265 lb (120.2 kg)  06/17/18 268 lb (121.6 kg)  06/27/17 268 lb (121.6 kg)     GEN: Abdominal obesity. No acute distress HEENT: Normal NECK: No JVD. LYMPHATICS: No lymphadenopathy CARDIAC: RRR.  2/6 left lower sternal and apical systolic murmur, no gallop, no edema VASCULAR: 2+ radial and carotid bilateral pulses, no bruits RESPIRATORY:  Clear to auscultation without rales, wheezing or rhonchi  ABDOMEN: Soft, non-tender, non-distended, No pulsatile mass, MUSCULOSKELETAL: No deformity  SKIN: Warm and dry NEUROLOGIC:  Alert and oriented x 3 PSYCHIATRIC:  Normal affect   ASSESSMENT:    1. Non-rheumatic mitral regurgitation   2. Mixed hyperlipidemia   3. Coronary artery disease involving native coronary artery of native heart with angina pectoris ([place])    PLAN:    In order of problems listed above:  1. Secondary mitral regurgitation related to ischemic heart disease.  Regurgitation is mild to moderate.  LV size and function are normal.  There is mild LV dilatation. 2. LDL target should be less than 70. 3. Secondary prevention was discussed in detail.  No evidence of ischemia on nuclear  scintigraphy. 4. Significant first-degree heart block with PR interval 326 ms.  Decrease Toprol-XL to 12.5 mg daily.  Overall education and awareness concerning primary/secondary risk prevention was discussed in detail: LDL less than 70, hemoglobin A1c less than 7, blood pressure target less than 130/80 mmHg, >150 minutes of moderate aerobic activity per week, avoidance of smoking, weight control (via diet and exercise), and continued surveillance/management of/for obstructive sleep apnea.  1 year clinical follow-up   Medication Adjustments/Labs and Tests Ordered: Current medicines are reviewed at length with the patient today.  Concerns regarding medicines are outlined above.  Orders Placed This Encounter  Procedures  . EKG 12-Lead   Meds ordered this encounter  Medications  . metoprolol succinate (TOPROL-XL) 25 MG 24 hr tablet    Sig: Take 0.5 tablets (12.5 mg total) by mouth daily.    Dispense:  45 tablet    Refill:  0    Dose change    Patient Instructions  Medication Instructions:  1) DECREASE Metoprolol to 12.5mg  once daily  If you need a refill on your cardiac medications before your next appointment, please call your pharmacy.   Lab work: None If you have labs (blood work) drawn today and your tests are completely normal, you Ronnald receive your results only by: Marland Kitchen MyChart Message (if you have MyChart) OR . A paper copy in the mail If you have any lab test that is abnormal or we need to change your treatment, we Bacilio call you to review the results.  Testing/Procedures: None  Follow-Up: At [facility], you and your health needs are our priority.  As part of our continuing mission to provide you with exceptional heart care, we have created designated Provider Care Teams.  These Care Teams include your primary Cardiologist (physician) and Advanced Practice Providers (APPs -  Physician Assistants and Nurse Practitioners) who all work together to provide you with the care  you need, when you need it. You [place] need a follow up appointment in 12 months.  Please call our office 2 months in advance to schedule this appointment.  You may see Sinclair Grooms, MD or one of the following  Advanced Practice Providers on your designated Care Team:   Truitt Merle, NP Cecilie Kicks, NP . Kathyrn Drown, NP  Any Other Special Instructions Danielle Be Listed Below (If Applicable).       Signed, Sinclair Grooms, MD  07/22/2018 1:06 PM    [facility]

## 2018-07-22 ENCOUNTER — Encounter: Payer: Self-pay | Admitting: Interventional Cardiology

## 2018-07-22 ENCOUNTER — Ambulatory Visit: Payer: Medicare Other | Admitting: Interventional Cardiology

## 2018-07-22 VITALS — BP 108/64 | HR 75 | Ht 70.0 in | Wt 265.0 lb

## 2018-07-22 DIAGNOSIS — I25119 Atherosclerotic heart disease of native coronary artery with unspecified angina pectoris: Secondary | ICD-10-CM

## 2018-07-22 DIAGNOSIS — E782 Mixed hyperlipidemia: Secondary | ICD-10-CM

## 2018-07-22 DIAGNOSIS — I340 Nonrheumatic mitral (valve) insufficiency: Secondary | ICD-10-CM

## 2018-07-22 MED ORDER — METOPROLOL SUCCINATE ER 25 MG PO TB24: 12.5000 mg | ORAL_TABLET | Freq: Every day | ORAL | 0 refills | Status: DC

## 2018-07-22 NOTE — Patient Instructions (Addendum)
Medication Instructions:  1) DECREASE Metoprolol to 12.5mg  once daily  If you need a refill on your cardiac medications before your next appointment, please call your pharmacy.   Lab work: None If you have labs (blood work) drawn today and your tests are completely normal, you [place] receive your results only by: Marland Kitchen MyChart Message (if you have MyChart) OR . A paper copy in the mail If you have any lab test that is abnormal or we need to change your treatment, we Elden call you to review the results.  Testing/Procedures: None  Follow-Up: At [facility], you and your health needs are our priority.  As part of our continuing mission to provide you with exceptional heart care, we have created designated Provider Care Teams.  These Care Teams include your primary Cardiologist (physician) and Advanced Practice Providers (APPs -  Physician Assistants and Nurse Practitioners) who all work together to provide you with the care you need, when you need it. You Steven Mckee need a follow up appointment in 12 months.  Please call our office 2 months in advance to schedule this appointment.  You may see Sinclair Grooms, MD or one of the following Advanced Practice Providers on your designated Care Team:   Truitt Merle, NP Cecilie Kicks, NP . Kathyrn Drown, NP  Any Other Special Instructions Amous Be Listed Below (If Applicable).

## 2018-09-25 ENCOUNTER — Other Ambulatory Visit: Payer: Self-pay | Admitting: Interventional Cardiology

## 2018-10-15 ENCOUNTER — Other Ambulatory Visit: Payer: Self-pay | Admitting: Interventional Cardiology

## 2018-11-11 ENCOUNTER — Other Ambulatory Visit: Payer: Self-pay | Admitting: Interventional Cardiology

## 2019-03-10 ENCOUNTER — Other Ambulatory Visit: Payer: Self-pay | Admitting: Interventional Cardiology

## 2019-03-10 MED ORDER — METOPROLOL SUCCINATE ER 25 MG PO TB24: 25.0000 mg | ORAL_TABLET | Freq: Every day | ORAL | 1 refills | Status: DC

## 2019-03-10 MED ORDER — LISINOPRIL-HYDROCHLOROTHIAZIDE 20-12.5 MG PO TABS: 1.0000 | ORAL_TABLET | Freq: Every day | ORAL | 1 refills | Status: DC

## 2019-05-25 ENCOUNTER — Telehealth: Payer: Self-pay | Admitting: Interventional Cardiology

## 2019-05-25 NOTE — Telephone Encounter (Signed)
Pt calling to check on any lab work or tests that may need to be done prior to his appointment in March. I advised him I did not see any upcoming tests or labs, but I would forward to Dr. Thompson Caul nurse to follow up. He verbalized understanding and had no additional questions.

## 2019-05-25 NOTE — Telephone Encounter (Signed)
New message    Patient wants to know if he needs orders for annual labs or other testing

## 2019-05-26 NOTE — Telephone Encounter (Signed)
Most recent labs are from 12/2017 from PCP.  Left message for pt to call back and let us know if he has had labs at PCP office this year, if not we Phi plan to draw labs here.

## 2019-05-28 NOTE — Telephone Encounter (Signed)
Spoke with pt and he states he has gotten labs in the last year at Dr. Jodi Mourning office.  He does not have an upcoming appt with them.  Advised I Steven Mckee have medical records obtain those labs and I Steven Mckee call back to schedule labs if we need anything they didn't draw.  Pt appreciative for call.

## 2019-05-28 NOTE — Telephone Encounter (Signed)
Left message for pt to call back.  Need to inquire if had labs at PCP in the last year or if he Steven Mckee have them at PCP prior to appt with Dr. Tamala Julian.  If not, need to set up BMET, CBC, A1C, Lipid and Liver in our office.

## 2019-06-15 NOTE — Telephone Encounter (Signed)
Labs received from PCP.  No further labs needed.  [place] have scanned into system.

## 2019-07-04 ENCOUNTER — Ambulatory Visit: Payer: Medicare Other | Attending: Internal Medicine

## 2019-07-04 DIAGNOSIS — Z23 Encounter for immunization: Secondary | ICD-10-CM

## 2019-07-04 NOTE — Progress Notes (Signed)
   Covid-19 Vaccination Clinic  Name:  Steven Mckee    MRN: [id number] DOB: 13-Jul-1950  07/04/2019  Mr. Steven Mckee was observed post Covid-19 immunization for 15 minutes without incidence. He was provided with Vaccine Information Sheet and instruction to access the V-Safe system.   Mr. Steven Mckee was instructed to call 911 with any severe reactions post vaccine: Marland Kitchen Difficulty breathing  . Swelling of your face and throat  . A fast heartbeat  . A bad rash all over your body  . Dizziness and weakness    Immunizations Administered    Name Date Dose VIS Date Route   Pfizer COVID-19 Vaccine 07/04/2019  2:50 PM 0.3 mL 05/22/2019 Intramuscular   Manufacturer: [place]   Lot: [id number]   [place]: [id number]

## 2019-07-26 ENCOUNTER — Ambulatory Visit: Payer: Medicare Other | Attending: Internal Medicine

## 2019-07-26 DIAGNOSIS — Z23 Encounter for immunization: Secondary | ICD-10-CM

## 2019-07-26 NOTE — Progress Notes (Signed)
   Covid-19 Vaccination Clinic  Name:  Lohith Kiehn    MRN: [id number] DOB: [birth year]  07/26/2019  Mr. Frix was observed post Covid-19 immunization for 15 minutes without incidence. He was provided with Vaccine Information Sheet and instruction to access the V-Safe system.   Mr. Ensor was instructed to call 911 with any severe reactions post vaccine: Marland Kitchen Difficulty breathing  . Swelling of your face and throat  . A fast heartbeat  . A bad rash all over your body  . Dizziness and weakness    Immunizations Administered    Name Date Dose VIS Date Route   Pfizer COVID-19 Vaccine 07/26/2019 10:35 AM 0.3 mL 05/22/2019 Intramuscular   Manufacturer: [place]   Lot: [id number]   [place]: [id number]

## 2019-07-27 ENCOUNTER — Other Ambulatory Visit: Payer: Self-pay | Admitting: Interventional Cardiology

## 2019-07-28 ENCOUNTER — Ambulatory Visit: Payer: Medicare Other

## 2019-08-19 ENCOUNTER — Ambulatory Visit: Payer: Medicare Other | Admitting: Interventional Cardiology

## 2019-09-24 NOTE — Progress Notes (Signed)
Cardiology Office Note:    Date:  09/25/2019   ID:  Steven Mckee, DOB 04-19-1951, MRN [id number]  PCP:  [name], MD  Cardiologist:  Sinclair Grooms, MD   Referring MD: [name], MD   Chief Complaint  Patient presents with  . Coronary Artery Disease  . Cardiac Valve Problem    History of Present Illness:    Steven Mckee is a 69 y.o. 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  Steven Mckee is doing well.  He does have fatigue if he climbs a flight of stairs.  He exercises 40 minutes 3-4 times per week.  He does his own yard work.  He has lost 15 to 20 pounds during the pandemic.  Breathing is doing well.  He denies angina.  No orthopnea PND.  Past Medical History:  Diagnosis Date  . CAD (coronary artery disease)   . Coronary artery disease   . Diabetes mellitus without complication ([place])   . HTN (hypertension) 10/28/2012  . Hypercholesteremia   . Hyperlipidemia 03/02/2014  . Low testosterone   . Metabolic syndrome   . Mitral valve regurgitation   . 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 Myocardial infarction ([place])   . Obesity   . Obesity     Past Surgical History:  Procedure Laterality Date  . SKIN SURGERY      Current Medications: Current Meds  Medication Sig  . aspirin EC 81 MG tablet Take 1 tablet (81 mg total) by mouth daily.  . halobetasol (ULTRAVATE) 0.05 % ointment Apply 1 application topically daily as needed. (contact dermatitis)  . imiquimod (ALDARA) 5 % cream Apply 1 application topically as needed. FOR SKIN  . lisinopril-hydrochlorothiazide (ZESTORETIC) 20-12.5 MG tablet Take 1 tablet by mouth daily.  . metFORMIN (GLUCOPHAGE) 500 MG tablet Take 500 mg by mouth daily with  breakfast.  . rosuvastatin (CRESTOR) 5 MG tablet TAKE 1 TABLET BY MOUTH EVERY DAY  . [DISCONTINUED] metoprolol succinate (TOPROL-XL) 25 MG 24 hr tablet Take 12.5 mg by mouth daily.     Allergies:   Patient has no known allergies.   Social History   Socioeconomic History  . Marital status: Married    Spouse name: Not on file  . Number of children: Not on file  . Years of education: Not on file  . Highest education level: Not on file  Occupational History  . Not on file  Tobacco Use  . Smoking status: Former Smoker    Quit date: 03/05/2009    Years since quitting: 10.5  . Smokeless tobacco: Never Used  Substance and Sexual Activity  . Alcohol use: Never    Alcohol/week: 0.0 standard drinks  . Drug use: Never  . Sexual activity: Not on file  Other Topics Concern  . Not on file  Social History Narrative  . Not on file   Social Determinants of Health   Financial Resource Strain:   . Difficulty of Paying Living Expenses:   Food Insecurity:   . Worried About Charity fundraiser in the Last Year:   . Arboriculturist in the Last Year:   Transportation Needs:   . Film/video editor (Medical):   Marland Kitchen Lack of Transportation (Non-Medical):   Physical Activity:   . Days  of Exercise per Week:   . Minutes of Exercise per Session:   Stress:   . Feeling of Stress :   Social Connections:   . Frequency of Communication with Friends and Family:   . Frequency of Social Gatherings with Friends and Family:   . Attends Religious Services:   . Active Member of Clubs or Organizations:   . Attends Archivist Meetings:   Marland Kitchen Marital Status:      Family History: The patient's family history includes Dementia in his mother; Heart disease in his father; Stroke in his father.  ROS:   Please see the history of present illness.    Sleeping well.  Eating well.  Exercising.  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  All other systems reviewed and are negative.  EKGs/Labs/Other Studies  Reviewed:    The following studies were reviewed today: ECHOCARDIOGRAM 2019: Study Conclusions   - Left ventricle: The cavity size was mildly dilated. There was  moderate focal basal and mild concentric hypertrophy. Systolic  function was normal. The estimated ejection fraction was in the  range of 60% to 65%. Wall motion was normal; there were no  regional wall motion abnormalities. Features are consistent with  a pseudonormal left ventricular filling pattern, with concomitant  abnormal relaxation and increased filling pressure (grade 2  diastolic dysfunction). Doppler parameters are consistent with  high ventricular filling pressure.  - Aortic valve: Trileaflet; normal thickness, mildly calcified  leaflets.  - Aorta: Aortic root dimension: 39 mm (ED).  - Aortic root: The aortic root was mildly dilated.  - Mitral valve: Mild, holosystolicprolapse, involving the posterior  leaflet. There was mild regurgitation.  - Left atrium: The atrium was mildly dilated.  - Pulmonic valve: There was mild regurgitation.  - Pulmonary arteries: Systolic pressure could not be accurately  estimated.  Nuclear stress test 2020: Study Highlights   Nuclear stress EF: 73%. The left ventricular ejection fraction is hyperdynamic (>65%).  Defect 1: There is a medium defect of mild severity present in the basal inferolateral and mid inferolateral location.  This is a low risk study. There is a mild defect in the inferior lateral wall that may represent a prior subendocardial infarction vs. chest wall attenuation . This region contracts normally     EKG:  EKG sinus rhythm, first-degree AV block, otherwise normal.  Recent Labs: No results found for requested labs within last 8760 hours.  Recent Lipid Panel    Component Value Date/Time   CHOL 149 02/26/2014 0756   CHOL 126 11/13/2012 0841   TRIG 105.0 02/26/2014 0756   TRIG 68 06/15/2013 0856   TRIG 112 11/13/2012 0841   HDL  43.40 02/26/2014 0756   HDL 61 06/15/2013 0856   HDL 46 11/13/2012 0841   CHOLHDL 3 02/26/2014 0756   VLDL 21.0 02/26/2014 0756   LDLCALC 85 02/26/2014 0756   LDLCALC 78 06/15/2013 0856   LDLCALC 58 11/13/2012 0841    Physical Exam:    VS:  BP 108/62   Pulse 63   Ht 5\' 10"  (1.778 m)   Wt 249 lb 3.2 oz (113 kg)   SpO2 99%   BMI 35.76 kg/m     Wt Readings from Last 3 Encounters:  09/25/19 249 lb 3.2 oz (113 kg)  07/22/18 265 lb (120.2 kg)  06/17/18 268 lb (121.6 kg)     GEN: Moderate obesity. No acute distress HEENT: Normal NECK: No JVD. LYMPHATICS: No lymphadenopathy CARDIAC:  RRR with 2/6 apical systolic  MR murmur that radiates to the left axilla, but with no gallop, or edema. VASCULAR:  Normal Pulses. No bruits. RESPIRATORY:  Clear to auscultation without rales, wheezing or rhonchi  ABDOMEN: Soft, non-tender, non-distended, No pulsatile mass, MUSCULOSKELETAL: No deformity  SKIN: Warm and dry NEUROLOGIC:  Alert and oriented x 3 PSYCHIATRIC:  Normal affect   ASSESSMENT:    1. Non-rheumatic mitral regurgitation   2. Mixed hyperlipidemia   3. Coronary artery disease involving native coronary artery of native heart with angina pectoris ([place])   4. Educated about COVID-19 virus infection   5. First degree AV block    PLAN:    In order of problems listed above:  1. No clinical evidence of progression of valvular insufficiency.  Plan 2D Doppler echocardiogram before the next office visit.  Continue Zestoretic. 2. Most recent LDL was 67 and August 2020.  Continue statin therapy. 3. Secondary prevention discussed in detail.  Major improvements are his exercise protocol and weight loss.  I encouraged continuation of these efforts. 4. He has been vaccinated and is practicing social distancing. 5. Discontinue metoprolol succinate 12.5 mg/day due to progression of first-degree AV block.  We discussed the potential downstream impact of AV conduction system disease and the  possibility of pacemaker therapy at some point in the future.  Overall education and awareness concerning primary/secondary risk prevention was discussed in detail: LDL less than 70, hemoglobin A1c less than 7, blood pressure target less than 130/80 mmHg, >150 minutes of moderate aerobic activity per week, avoidance of smoking, weight control (via diet and exercise), and continued surveillance/management of/for obstructive sleep apnea.    Medication Adjustments/Labs and Tests Ordered: Current medicines are reviewed at length with the patient today.  Concerns regarding medicines are outlined above.  Orders Placed This Encounter  Procedures  . EKG 12-Lead  . ECHOCARDIOGRAM COMPLETE   No orders of the defined types were placed in this encounter.   Patient Instructions  Medication Instructions:  1) DISCONTINUE Metoprolol Succinate  *If you need a refill on your cardiac medications before your next appointment, please call your pharmacy*  Lab Work: None If you have labs (blood work) drawn today and your tests are completely normal, you [place] receive your results only by: Marland Kitchen MyChart Message (if you have MyChart) OR . A paper copy in the mail If you have any lab test that is abnormal or we need to change your treatment, we [place]ial call you to review the results.  Testing/Procedures: Your physician has requested that you have an echocardiogram 1-2 weeks prior to seeing Dr. Tamala Julian back in a year. Echocardiography is a painless test that uses sound waves to create images of your heart. It provides your doctor with information about the size and shape of your heart and how well your heart's chambers and valves are working. This procedure takes approximately one hour. There are no restrictions for this procedure.    Follow-Up: At [facility], you and your health needs are our priority.  As part of our continuing mission to provide you with exceptional heart care, we have created designated  Provider Care Teams.  These Care Teams include your primary Cardiologist (physician) and Advanced Practice Providers (APPs -  Physician Assistants and Nurse Practitioners) who all work together to provide you with the care you need, when you need it. Your next appointment:   12 month(s) The format for your next appointment:   In Person Provider:   You may see Sinclair Grooms, MD  or one of the following Advanced Practice Providers on your designated Care Team:    Truitt Merle, NP  Cecilie Kicks, NP  Kathyrn Drown, NP      Signed, Sinclair Grooms, MD  09/25/2019 8:32 AM    [place]

## 2019-09-25 ENCOUNTER — Other Ambulatory Visit: Payer: Self-pay

## 2019-09-25 ENCOUNTER — Ambulatory Visit: Payer: Medicare Other | Admitting: Interventional Cardiology

## 2019-09-25 ENCOUNTER — Encounter: Payer: Self-pay | Admitting: Interventional Cardiology

## 2019-09-25 VITALS — BP 108/62 | HR 63 | Ht 70.0 in | Wt 249.2 lb

## 2019-09-25 DIAGNOSIS — E782 Mixed hyperlipidemia: Secondary | ICD-10-CM | POA: Diagnosis not present

## 2019-09-25 DIAGNOSIS — I25119 Atherosclerotic heart disease of native coronary artery with unspecified angina pectoris: Secondary | ICD-10-CM | POA: Diagnosis not present

## 2019-09-25 DIAGNOSIS — I340 Nonrheumatic mitral (valve) insufficiency: Secondary | ICD-10-CM | POA: Diagnosis not present

## 2019-09-25 DIAGNOSIS — Z7189 Other specified counseling: Secondary | ICD-10-CM

## 2019-09-25 DIAGNOSIS — I440 Atrioventricular block, first degree: Secondary | ICD-10-CM

## 2019-09-25 NOTE — Patient Instructions (Addendum)
Medication Instructions:  1) DISCONTINUE Metoprolol Succinate  *If you need a refill on your cardiac medications before your next appointment, please call your pharmacy*  Lab Work: None If you have labs (blood work) drawn today and your tests are completely normal, you [place] receive your results only by: Marland Kitchen MyChart Message (if you have MyChart) OR . A paper copy in the mail If you have any lab test that is abnormal or we need to change your treatment, we Cardarius call you to review the results.  Testing/Procedures: Your physician has requested that you have an echocardiogram 1-2 weeks prior to seeing Dr. Tamala Julian back in a year. Echocardiography is a painless test that uses sound waves to create images of your heart. It provides your doctor with information about the size and shape of your heart and how well your heart's chambers and valves are working. This procedure takes approximately one hour. There are no restrictions for this procedure.    Follow-Up: At [facility], you and your health needs are our priority.  As part of our continuing mission to provide you with exceptional heart care, we have created designated Provider Care Teams.  These Care Teams include your primary Cardiologist (physician) and Advanced Practice Providers (APPs -  Physician Assistants and Nurse Practitioners) who all work together to provide you with the care you need, when you need it. Your next appointment:   12 month(s) The format for your next appointment:   In Person Provider:   You may see Sinclair Grooms, MD or one of the following Advanced Practice Providers on your designated Care Team:    Truitt Merle, NP  Cecilie Kicks, NP  Kathyrn Drown, NP

## 2019-10-26 ENCOUNTER — Other Ambulatory Visit: Payer: Self-pay | Admitting: Interventional Cardiology

## 2019-12-02 ENCOUNTER — Ambulatory Visit: Payer: Self-pay | Admitting: Surgery

## 2019-12-30 ENCOUNTER — Encounter (HOSPITAL_COMMUNITY): Payer: Self-pay

## 2019-12-30 NOTE — Patient Instructions (Signed)
DUE TO COVID-19 ONLY ONE VISITOR IS ALLOWED TO COME WITH YOU AND STAY IN THE WAITING ROOM ONLY DURING PRE OP AND PROCEDURE DAY OF SURGERY. TWO  VISITOR MAY VISIT WITH YOU AFTER SURGERY IN YOUR PRIVATE ROOM DURING VISITING HOURS ONLY!  YOU NEED TO HAVE A COVID 19 TEST ON_7-29-21______ @_______ , THIS TEST MUST BE DONE BEFORE SURGERY, COME  [address] [place] , [id number].  ([place]) ONCE YOUR COVID TEST IS COMPLETED, PLEASE BEGIN THE QUARANTINE INSTRUCTIONS AS OUTLINED IN YOUR HANDOUT.                Steven Mckee  12/30/2019   Your procedure is scheduled on: 01-11-20   Report to [facility] Main  Entrance   Report to admitting at      [place] AM     Call this number if you have problems the morning of surgery [phone]    Remember: Do not eat food  :After Midnight. You may have clear liquids until 0945 am then nothing by mouth   BRUSH YOUR TEETH MORNING OF SURGERY AND RINSE YOUR MOUTH OUT, NO CHEWING GUM CANDY OR MINTS.     Take these medicines the morning of surgery with A SIP OF WATER: rouvastatin  DO NOT TAKE ANY DIABETIC MEDICATIONS DAY OF YOUR SURGERY                               You may not have any metal on your body including hair pins and              piercings  Do not wear jewelry,  lotions, powders or perfumes, deodorant                     Men may shave face and neck.   Do not bring valuables to the hospital. [place].  Contacts, dentures or bridgework may not be worn into surgery.       Patients discharged the day of surgery Willi not be allowed to drive home. IF YOU ARE HAVING SURGERY AND GOING HOME THE SAME DAY, YOU MUST HAVE AN ADULT TO DRIVE YOU HOME AND BE WITH YOU FOR 24 HOURS. YOU MAY GO HOME BY TAXI OR UBER OR ORTHERWISE, BUT AN ADULT MUST ACCOMPANY YOU HOME AND STAY WITH YOU FOR 24 HOURS.  Name and phone number of your driver:  Special Instructions: N/A               Please read over the following fact sheets you were given: _____________________________________________________________________             [facility] - Preparing for Surgery Before surgery, you can play an important role.  Because skin is not sterile, your skin needs to be as free of germs as possible.  You can reduce the number of germs on your skin by washing with CHG (chlorahexidine gluconate) soap before surgery.  CHG is an antiseptic cleaner which kills germs and bonds with the skin to continue killing germs even after washing. Please DO NOT use if you have an allergy to CHG or antibacterial soaps.  If your skin becomes reddened/irritated stop using the CHG and inform your nurse when you arrive at Short Stay. Do not shave (including legs and underarms) for at least 48 hours  prior to the first CHG shower.  You may shave your face/neck. Please follow these instructions carefully:  1.  Shower with CHG Soap the night before surgery and the  morning of Surgery.  2.  If you choose to wash your hair, wash your hair first as usual with your  normal  shampoo.  3.  After you shampoo, rinse your hair and body thoroughly to remove the  shampoo.                           4.  Use CHG as you would any other liquid soap.  You can apply chg directly  to the skin and wash                       Gently with a scrungie or clean washcloth.  5.  Apply the CHG Soap to your body ONLY FROM THE NECK DOWN.   Do not use on face/ open                           Wound or open sores. Avoid contact with eyes, ears mouth and genitals (private parts).                       Wash face,  Genitals (private parts) with your normal soap.             6.  Wash thoroughly, paying special attention to the area where your surgery  Ishmail be performed.  7.  Thoroughly rinse your body with warm water from the neck down.  8.  DO NOT shower/wash with your normal soap after using and rinsing off  the CHG Soap.                9.  Pat  yourself dry with a clean towel.            10.  Wear clean pajamas.            11.  Place clean sheets on your bed the night of your first shower and do not  sleep with pets. Day of Surgery : Do not apply any lotions/deodorants the morning of surgery.  Please wear clean clothes to the hospital/surgery center.  FAILURE TO FOLLOW THESE INSTRUCTIONS MAY RESULT IN THE CANCELLATION OF YOUR SURGERY PATIENT SIGNATURE_________________________________  NURSE SIGNATURE__________________________________  ________________________________________________________________________

## 2019-12-30 NOTE — Progress Notes (Addendum)
PCP - Yaakov Guthrie MD Cardiologist - Daneen Schick LOV 09-25-19 epic  PPM/ICD -  Device Orders -  Rep Notified -   Chest x-ray -  EKG - 09-25-19 epic Stress Test - 06-17-18 epic ECHO - 2019 epic Cardiac Cath -   Sleep Study -  CPAP -   Fasting Blood Sugar -  Checks Blood Sugar __o___ times a day  Blood Thinner Instructions: Aspirin Instructions: 81mg  stopped 7-29  ERAS Protcol - PRE-SURGERY Ensure or G2-   COVID TEST- 7-29  Activity- Exercises daily Able to walk a flight of stairs without sob Anesthesia review: CAD, MI, HTN, ,DM  Patient denies shortness of breath, fever, cough and chest pain at PAT appointment    NONE   All instructions explained to the patient, with a verbal understanding of the material. Patient agrees to go over the instructions while at home for a better understanding. Patient also instructed to self quarantine after being tested for COVID-19. The opportunity to ask questions was provided.

## 2020-01-06 ENCOUNTER — Encounter (HOSPITAL_COMMUNITY): Payer: Self-pay

## 2020-01-06 ENCOUNTER — Other Ambulatory Visit: Payer: Self-pay

## 2020-01-06 ENCOUNTER — Encounter (HOSPITAL_COMMUNITY)
Admission: RE | Admit: 2020-01-06 | Discharge: 2020-01-06 | Disposition: A | Discharge status: Home / Self Care | Payer: Medicare Other | Source: Ambulatory Visit | Attending: Surgery | Admitting: Surgery

## 2020-01-06 DIAGNOSIS — Z01812 Encounter for preprocedural laboratory examination: Secondary | ICD-10-CM | POA: Insufficient documentation

## 2020-01-06 LAB — CBC
HCT: 40.2 % (ref 39.0–52.0)
Hemoglobin: 13.3 g/dL (ref 13.0–17.0)
MCH: 31.7 pg (ref 26.0–34.0)
MCHC: 33.1 g/dL (ref 30.0–36.0)
MCV: 95.7 fL (ref 80.0–100.0)
Platelets: 151 10*3/uL (ref 150–400)
RBC: 4.2 MIL/uL — ABNORMAL LOW (ref 4.22–5.81)
RDW: 13.1 % (ref 11.5–15.5)
WBC: 6.1 10*3/uL (ref 4.0–10.5)
nRBC: 0 % (ref 0.0–0.2)

## 2020-01-06 LAB — BASIC METABOLIC PANEL
Anion gap: 10 (ref 5–15)
BUN: 30 mg/dL — ABNORMAL HIGH (ref 8–23)
CO2: 26 mmol/L (ref 22–32)
Calcium: 9.6 mg/dL (ref 8.9–10.3)
Chloride: 103 mmol/L (ref 98–111)
Creatinine, Ser: 0.99 mg/dL (ref 0.61–1.24)
GFR calc Af Amer: >60 mL/min (ref >60)
GFR calc non Af Amer: >60 mL/min (ref >60)
Glucose, Bld: 103 mg/dL — ABNORMAL HIGH (ref 70–99)
Potassium: 4.3 mmol/L (ref 3.5–5.1)
Sodium: 139 mmol/L (ref 135–145)

## 2020-01-06 LAB — HEMOGLOBIN A1C
Hgb A1c MFr Bld: 6.1 % — ABNORMAL HIGH (ref 4.8–5.6)
Mean Plasma Glucose: 128.37 mg/dL

## 2020-01-06 LAB — GLUCOSE, CAPILLARY: Glucose-Capillary: 105 mg/dL — ABNORMAL HIGH (ref 70–99)

## 2020-01-07 ENCOUNTER — Other Ambulatory Visit (HOSPITAL_COMMUNITY)
Admission: RE | Admit: 2020-01-07 | Discharge: 2020-01-07 | Disposition: A | Discharge status: Home / Self Care | Payer: Medicare Other | Source: Ambulatory Visit | Attending: Surgery | Admitting: Surgery

## 2020-01-07 DIAGNOSIS — Z20822 Contact with and (suspected) exposure to covid-19: Secondary | ICD-10-CM | POA: Diagnosis not present

## 2020-01-07 DIAGNOSIS — Z01812 Encounter for preprocedural laboratory examination: Secondary | ICD-10-CM | POA: Diagnosis present

## 2020-01-07 LAB — SARS CORONAVIRUS 2 (TAT 6-24 HRS): SARS Coronavirus 2: NEGATIVE

## 2020-01-07 NOTE — Progress Notes (Signed)
Anesthesia Chart Review   Case: [id number] Date/Time: 01/11/20 1130   Procedure: OPEN REPAIR VENTRAL HERNIA WITH MESH PATCH (N/A ) - LMA   Anesthesia type: General   Pre-op diagnosis: VENTRAL HERNIA   Location: [facility] ROOM 02 / [facility]   Surgeons: Armandina Gemma, MD      DISCUSSION:69 y.o. former smoker (quit 03/05/2009) with h/o HTN, DM II, HLD, CAD, non-rheumatic mitral regurgitation, ventral hernia scheduled for above procedure 01/11/2020 with Dr. Armandina Gemma.   Low risk stress test 06/17/2018.   Pt last seen by cardiologist 09/25/2019.  Stable at this visit.  Pt exercises 40 minutes 3-4 times per week, does his own yardwork.    Anticipate pt can proceed with planned procedure barring acute status change.   VS: BP (!) 120/58   Pulse 70   Temp 36.8 C (Oral)   Resp 18   Ht 5\' 10"  (1.778 m)   Wt (!) 115.8 kg   SpO2 99%   BMI 36.62 kg/m   PROVIDERS: [name], MD is PCP   Daneen Schick, MD is Cardiologist  LABS: Labs reviewed: Acceptable for surgery. (all labs ordered are listed, but only abnormal results are displayed)  Labs Reviewed  HEMOGLOBIN A1C - Abnormal; Notable for the following components:      Result Value   Hgb A1c MFr Bld 6.1 (*)    All other components within normal limits  BASIC METABOLIC PANEL - Abnormal; Notable for the following components:   Glucose, Bld 103 (*)    BUN 30 (*)    All other components within normal limits  CBC - Abnormal; Notable for the following components:   RBC 4.20 (*)    All other components within normal limits  GLUCOSE, CAPILLARY - Abnormal; Notable for the following components:   Glucose-Capillary 105 (*)    All other components within normal limits     IMAGES:   EKG: 09/25/2019 Rate 63 bpm 1st degree AV block   CV: Myocardial Perfusion 06/17/2018  Nuclear stress EF: 73%. The left ventricular ejection fraction is hyperdynamic (>65%).  Defect 1: There is a medium defect of mild severity present in the basal inferolateral  and mid inferolateral location.  This is a low risk study. There is a mild defect in the inferior lateral wall that may represent a prior subendocardial infarction vs. chest wall attenuation . This region contracts normally  Echo 06/18/2017 Study Conclusions   - Left ventricle: The cavity size was mildly dilated. There was  moderate focal basal and mild concentric hypertrophy. Systolic  function was normal. The estimated ejection fraction was in the  range of 60% to 65%. Wall motion was normal; there were no  regional wall motion abnormalities. Features are consistent with  a pseudonormal left ventricular filling pattern, with concomitant  abnormal relaxation and increased filling pressure (grade 2  diastolic dysfunction). Doppler parameters are consistent with  high ventricular filling pressure.  - Aortic valve: Trileaflet; normal thickness, mildly calcified  leaflets.  - Aorta: Aortic root dimension: 39 mm (ED).  - Aortic root: The aortic root was mildly dilated.  - Mitral valve: Mild, holosystolicprolapse, involving the posterior  leaflet. There was mild regurgitation.  - Left atrium: The atrium was mildly dilated.  - Pulmonic valve: There was mild regurgitation.  - Pulmonary arteries: Systolic pressure could not be accurately  estimated.  Past Medical History:  Diagnosis Date  . CAD (coronary artery disease)   . Coronary artery disease   . Diabetes mellitus without  complication ([place])    type 2  . HTN (hypertension) 10/28/2012  . Hypercholesteremia   . Hyperlipidemia 03/02/2014  . Low testosterone   . Metabolic syndrome   . Mitral valve regurgitation   . Myocardial infarction ([place])   . Obesity   . Obesity     Past Surgical History:  Procedure Laterality Date  . COLONOSCOPY    . SKIN SURGERY     Mohs    MEDICATIONS: . aspirin EC 81 MG tablet  . diphenhydrAMINE (BENADRYL) 25 MG tablet  . halobetasol (ULTRAVATE) 0.05 % ointment  . ibuprofen  (ADVIL) 200 MG tablet  . lisinopril-hydrochlorothiazide (ZESTORETIC) 20-12.5 MG tablet  . metFORMIN (GLUCOPHAGE) 500 MG tablet  . rosuvastatin (CRESTOR) 5 MG tablet   No current facility-administered medications for this encounter.   . regadenoson (LEXISCAN) injection SOLN 0.4 mg   Konrad Felix, [facility] Pre-Surgical Testing [phone]

## 2020-01-10 ENCOUNTER — Encounter (HOSPITAL_COMMUNITY): Payer: Self-pay | Admitting: Surgery

## 2020-01-10 DIAGNOSIS — K429 Umbilical hernia without obstruction or gangrene: Secondary | ICD-10-CM | POA: Diagnosis present

## 2020-01-10 DIAGNOSIS — M6208 Separation of muscle (nontraumatic), other site: Secondary | ICD-10-CM | POA: Diagnosis present

## 2020-01-10 DIAGNOSIS — K439 Ventral hernia without obstruction or gangrene: Secondary | ICD-10-CM | POA: Diagnosis present

## 2020-01-10 NOTE — H&P (Signed)
General Surgery [facility]  Steven Mckee DOB: 04-Oct-1950 Married / Language: English / Race: White Male   History of Present Illness   The patient is a 69 year old male who presents with an abdominal wall hernia.  CHIEF COMPLAINT: ventral hernia  Patient is referred by Dr. Yaakov Guthrie for surgical evaluation and management of ventral hernia. Patient states that this is been present for approximately 2 years. It has gradually enlarged in size. It has been minimally symptomatic. He has occasional discomfort. He has noted no change in his bowel movements. Patient has had no prior abdominal surgery. He has had no prior hernia repairs. He presents today for evaluation and recommendations.   Past Surgical History  No pertinent past surgical history   Diagnostic Studies History  Colonoscopy  5-10 years ago  Allergies  No Known Drug Allergies  [12/02/2019]:  Medication History  Rosuvastatin Calcium (5MG  Tablet, Oral) Active. Metoprolol Succinate ER (25MG  Tablet ER 24HR, Oral) Active. metFORMIN HCl (500MG  Tablet, Oral) Active. Aspirin (81MG  Tablet DR, Oral) Active. Medications Reconciled  Social History  Alcohol use  Occasional alcohol use. Caffeine use  Coffee. No drug use  Tobacco use  Former smoker.  Family History  Arthritis  Mother, Sister. Hypertension  Father.  Other Problems  Congestive Heart Failure  Diverticulosis  High blood pressure  Hypercholesterolemia   Review of Systems  General Not Present- Appetite Loss, Chills, Fatigue, Fever, Night Sweats, Weight Gain and Weight Loss. Skin Not Present- Change in Wart/Mole, Dryness, Hives, Jaundice, New Lesions, Non-Healing Wounds, Rash and Ulcer. HEENT Present- Wears glasses/contact lenses. Not Present- Earache, Hearing Loss, Hoarseness, Nose Bleed, Oral Ulcers, Ringing in the Ears, Seasonal Allergies, Sinus Pain, Sore Throat, Visual Disturbances and Yellow  Eyes. Respiratory Not Present- Bloody sputum, Chronic Cough, Difficulty Breathing, Snoring and Wheezing. Breast Not Present- Breast Mass, Breast Pain, Nipple Discharge and Skin Changes. Cardiovascular Not Present- Chest Pain, Difficulty Breathing Lying Down, Leg Cramps, Palpitations, Rapid Heart Rate, Shortness of Breath and Swelling of Extremities. Gastrointestinal Not Present- Abdominal Pain, Bloating, Bloody Stool, Change in Bowel Habits, Chronic diarrhea, Constipation, Difficulty Swallowing, Excessive gas, Gets full quickly at meals, Hemorrhoids, Indigestion, Nausea, Rectal Pain and Vomiting. Male Genitourinary Not Present- Blood in Urine, Change in Urinary Stream, Frequency, Impotence, Nocturia, Painful Urination, Urgency and Urine Leakage.  Vitals  Weight: 255 lb Height: 70in Body Surface Area: 2.31 m Body Mass Index: 36.59 kg/m  Temp.: 97.67F (Thermal Scan)  Pulse: 98 (Regular)  BP: 126/77(Sitting, Left Arm, Standard)  Physical Exam  GENERAL APPEARANCE Development: normal Nutritional status: normal Gross deformities: none  SKIN Rash, lesions, ulcers: none Induration, erythema: none Nodules: none palpable  EYES Conjunctiva and lids: normal Pupils: equal and reactive Iris: normal bilaterally  EARS, NOSE, MOUTH, THROAT External ears: no lesion or deformity External nose: no lesion or deformity Hearing: grossly normal Due to Covid-19 pandemic, patient is wearing a mask.  NECK Symmetric: yes Trachea: midline Thyroid: no palpable nodules in the thyroid bed  CHEST Respiratory effort: normal Retraction or accessory muscle use: no Breath sounds: normal bilaterally Rales, rhonchi, wheeze: none  CARDIOVASCULAR Auscultation: regular rhythm, normal rate Murmurs: none Pulses: radial pulse 2+ palpable Lower extremity edema: trace bilateral  ABDOMEN Distension: none Masses: none palpable Tenderness: none Hepatosplenomegaly: not present Hernia: To the  left of midline above the level of the umbilicus is an obvious visible bulge measuring approximately 5-6 cm in diameter. With manipulation, this is reducible. Fascial defect measures approximately 2 cm  in size. At the umbilicus is a small umbilical hernia containing incarcerated adipose tissue. This does not augment with Valsalva or sit up maneuver. It is nontender. With sit up maneuver there is a large rectus diastases in the upper abdominal wall.  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 Patient presents on referral from his primary care physician for evaluation of ventral hernia. Patient is provided with written literature on hernia repaired review at home.  On examination, the patient does have a ventral hernia just to the left of midline in the upper abdomen. We discussed operative repair as an outpatient surgical procedure. We discussed the use of prosthetic mesh. We discussed the risk of infection or recurrence. We discussed restrictions on his activities after the surgical procedure. The patient understands and wishes to proceed with surgery later this summer.  Also on examination, the patient has a small umbilical hernia which is stable and asymptomatic. At this time, I do not recommend repair. Also the patient has a moderate to large upper abdominal rectus  diastasis. We discussed the significance of this finding and the fact that this is purely cosmetic and does not require operative repair.  We discussed timing of surgery. Certainly additional weight loss Tammy make his repair easier, his recovery more straightforward, and the risk of recurrence last. Patient hopes to continue to lose approximately 15 pounds prior to surgery.  The risks and benefits of the procedure have been discussed at length with the patient. The patient understands the proposed procedure, potential alternative treatments, and the course of recovery to be expected. All of the patient's questions have been answered at this time. The patient wishes to proceed with surgery.  Armandina Gemma, MD [facility] Office: [phone]

## 2020-01-11 ENCOUNTER — Ambulatory Visit (HOSPITAL_COMMUNITY): Payer: Medicare Other | Admitting: Physician Assistant

## 2020-01-11 ENCOUNTER — Encounter (HOSPITAL_COMMUNITY)
Admission: RE | Disposition: A | Discharge status: Home / Self Care | Payer: Self-pay | Source: Home / Self Care | Attending: Surgery

## 2020-01-11 ENCOUNTER — Ambulatory Visit (HOSPITAL_COMMUNITY)
Admission: RE | Admit: 2020-01-11 | Discharge: 2020-01-11 | Disposition: A | Discharge status: Home / Self Care | Payer: Medicare Other | Attending: Surgery | Admitting: Surgery

## 2020-01-11 ENCOUNTER — Other Ambulatory Visit: Payer: Self-pay

## 2020-01-11 ENCOUNTER — Encounter (HOSPITAL_COMMUNITY): Payer: Self-pay | Admitting: Surgery

## 2020-01-11 ENCOUNTER — Ambulatory Visit (HOSPITAL_COMMUNITY): Payer: Medicare Other | Admitting: Anesthesiology

## 2020-01-11 DIAGNOSIS — K439 Ventral hernia without obstruction or gangrene: Secondary | ICD-10-CM | POA: Insufficient documentation

## 2020-01-11 DIAGNOSIS — I110 Hypertensive heart disease with heart failure: Secondary | ICD-10-CM | POA: Insufficient documentation

## 2020-01-11 DIAGNOSIS — E7800 Pure hypercholesterolemia, unspecified: Secondary | ICD-10-CM | POA: Insufficient documentation

## 2020-01-11 DIAGNOSIS — Z79899 Other long term (current) drug therapy: Secondary | ICD-10-CM | POA: Insufficient documentation

## 2020-01-11 DIAGNOSIS — I252 Old myocardial infarction: Secondary | ICD-10-CM | POA: Insufficient documentation

## 2020-01-11 DIAGNOSIS — Z7982 Long term (current) use of aspirin: Secondary | ICD-10-CM | POA: Diagnosis not present

## 2020-01-11 DIAGNOSIS — I25119 Atherosclerotic heart disease of native coronary artery with unspecified angina pectoris: Secondary | ICD-10-CM | POA: Insufficient documentation

## 2020-01-11 DIAGNOSIS — K429 Umbilical hernia without obstruction or gangrene: Secondary | ICD-10-CM | POA: Diagnosis not present

## 2020-01-11 DIAGNOSIS — Z7984 Long term (current) use of oral hypoglycemic drugs: Secondary | ICD-10-CM | POA: Diagnosis not present

## 2020-01-11 DIAGNOSIS — M6208 Separation of muscle (nontraumatic), other site: Secondary | ICD-10-CM | POA: Diagnosis not present

## 2020-01-11 DIAGNOSIS — E119 Type 2 diabetes mellitus without complications: Secondary | ICD-10-CM | POA: Insufficient documentation

## 2020-01-11 DIAGNOSIS — Z87891 Personal history of nicotine dependence: Secondary | ICD-10-CM | POA: Diagnosis not present

## 2020-01-11 HISTORY — PX: VENTRAL HERNIA REPAIR: SHX424

## 2020-01-11 LAB — GLUCOSE, CAPILLARY: Glucose-Capillary: 109 mg/dL — ABNORMAL HIGH (ref 70–99)

## 2020-01-11 SURGERY — REPAIR, HERNIA, VENTRAL, LAPAROSCOPIC
Anesthesia: General

## 2020-01-11 MED ORDER — HYDROMORPHONE HCL 1 MG/ML IJ SOLN
0.2500 mg | INTRAMUSCULAR | Status: DC | PRN
  Administered 2020-01-11: 0.25 mg via INTRAVENOUS

## 2020-01-11 MED ORDER — MIDAZOLAM HCL 2 MG/2ML IJ SOLN
INTRAMUSCULAR | Status: AC
  Filled 2020-01-11: qty 2

## 2020-01-11 MED ORDER — PROPOFOL 500 MG/50ML IV EMUL
INTRAVENOUS | Status: AC
  Filled 2020-01-11: qty 50

## 2020-01-11 MED ORDER — LIDOCAINE 2% (20 MG/ML) 5 ML SYRINGE
INTRAMUSCULAR | Status: AC
  Filled 2020-01-11: qty 5

## 2020-01-11 MED ORDER — ONDANSETRON HCL 4 MG/2ML IJ SOLN
INTRAMUSCULAR | Status: AC
  Filled 2020-01-11: qty 2

## 2020-01-11 MED ORDER — DEXAMETHASONE SODIUM PHOSPHATE 10 MG/ML IJ SOLN
INTRAMUSCULAR | Status: AC
  Filled 2020-01-11: qty 1

## 2020-01-11 MED ORDER — TRAMADOL HCL 50 MG PO TABS: 50.0000 mg | ORAL_TABLET | Freq: Four times a day (QID) | ORAL | 0 refills | Status: DC | PRN

## 2020-01-11 MED ORDER — LIDOCAINE 2% (20 MG/ML) 5 ML SYRINGE
INTRAMUSCULAR | Status: DC | PRN
  Administered 2020-01-11: 100 mg via INTRAVENOUS

## 2020-01-11 MED ORDER — FENTANYL CITRATE (PF) 100 MCG/2ML IJ SOLN
INTRAMUSCULAR | Status: DC | PRN
  Administered 2020-01-11: 50 ug via INTRAVENOUS
  Administered 2020-01-11 (×2): 25 ug via INTRAVENOUS

## 2020-01-11 MED ORDER — 0.9 % SODIUM CHLORIDE (POUR BTL) OPTIME
TOPICAL | Status: DC | PRN
  Administered 2020-01-11: 1000 mL

## 2020-01-11 MED ORDER — ORAL CARE MOUTH RINSE: 15.0000 mL | Freq: Once | OROMUCOSAL | Status: AC

## 2020-01-11 MED ORDER — CHLORHEXIDINE GLUCONATE CLOTH 2 % EX PADS: 6.0000 | MEDICATED_PAD | Freq: Once | CUTANEOUS | Status: DC

## 2020-01-11 MED ORDER — CHLORHEXIDINE GLUCONATE 0.12 % MT SOLN
15.0000 mL | Freq: Once | OROMUCOSAL | Status: AC
  Administered 2020-01-11: 15 mL via OROMUCOSAL

## 2020-01-11 MED ORDER — CEFAZOLIN SODIUM-DEXTROSE 2-4 GM/100ML-% IV SOLN
2.0000 g | INTRAVENOUS | Status: AC
  Administered 2020-01-11: 2 g via INTRAVENOUS
  Filled 2020-01-11: qty 100

## 2020-01-11 MED ORDER — BUPIVACAINE-EPINEPHRINE 0.25% -1:200000 IJ SOLN
INTRAMUSCULAR | Status: DC | PRN
  Administered 2020-01-11: 40 mL

## 2020-01-11 MED ORDER — PROPOFOL 10 MG/ML IV BOLUS
INTRAVENOUS | Status: DC | PRN
  Administered 2020-01-11: 200 mg via INTRAVENOUS

## 2020-01-11 MED ORDER — ONDANSETRON HCL 4 MG/2ML IJ SOLN
INTRAMUSCULAR | Status: DC | PRN
  Administered 2020-01-11: 4 mg via INTRAVENOUS

## 2020-01-11 MED ORDER — BUPIVACAINE-EPINEPHRINE 0.25% -1:200000 IJ SOLN
INTRAMUSCULAR | Status: AC
  Filled 2020-01-11: qty 1

## 2020-01-11 MED ORDER — HYDROMORPHONE HCL 1 MG/ML IJ SOLN
INTRAMUSCULAR | Status: AC
  Filled 2020-01-11: qty 1

## 2020-01-11 MED ORDER — FENTANYL CITRATE (PF) 100 MCG/2ML IJ SOLN
INTRAMUSCULAR | Status: AC
  Filled 2020-01-11: qty 2

## 2020-01-11 MED ORDER — DEXAMETHASONE SODIUM PHOSPHATE 10 MG/ML IJ SOLN
INTRAMUSCULAR | Status: DC | PRN
  Administered 2020-01-11: 10 mg via INTRAVENOUS

## 2020-01-11 MED ORDER — LACTATED RINGERS IV SOLN: INTRAVENOUS | Status: DC

## 2020-01-11 MED ORDER — MIDAZOLAM HCL 5 MG/5ML IJ SOLN
INTRAMUSCULAR | Status: DC | PRN
  Administered 2020-01-11: 2 mg via INTRAVENOUS

## 2020-01-11 SURGICAL SUPPLY — 41 items
ADH SKN CLS APL DERMABOND .7 (GAUZE/BANDAGES/DRESSINGS) ×1
APL PRP STRL LF DISP 70% ISPRP (MISCELLANEOUS) ×2
BINDER ABDOMINAL 12 ML 46-62 (SOFTGOODS) ×6 IMPLANT
CHLORAPREP W/TINT 26 (MISCELLANEOUS) ×6 IMPLANT
CLOSURE WOUND 1/2 X4 (GAUZE/BANDAGES/DRESSINGS) ×2
COVER SURGICAL LIGHT HANDLE (MISCELLANEOUS) ×3 IMPLANT
COVER WAND RF STERILE (DRAPES) IMPLANT
DECANTER SPIKE VIAL GLASS SM (MISCELLANEOUS) ×3 IMPLANT
DERMABOND ADVANCED (GAUZE/BANDAGES/DRESSINGS) ×2
DERMABOND ADVANCED .7 DNX12 (GAUZE/BANDAGES/DRESSINGS) ×1 IMPLANT
DEVICE SECURE STRAP 25 ABSORB (INSTRUMENTS) IMPLANT
DEVICE TROCAR PUNCTURE CLOSURE (ENDOMECHANICALS) ×3 IMPLANT
DRAPE INCISE IOBAN 66X45 STRL (DRAPES) IMPLANT
DRAPE UTILITY XL STRL (DRAPES) ×3 IMPLANT
ELECT REM PT RETURN 15FT ADLT (MISCELLANEOUS) ×3 IMPLANT
GLOVE SURG ORTHO 8.0 STRL STRW (GLOVE) ×3 IMPLANT
GOWN STRL REUS W/TWL XL LVL3 (GOWN DISPOSABLE) ×6 IMPLANT
KIT BASIN OR (CUSTOM PROCEDURE TRAY) ×3 IMPLANT
KIT TURNOVER KIT A (KITS) IMPLANT
MARKER SKIN DUAL TIP RULER LAB (MISCELLANEOUS) ×3 IMPLANT
MESH VENTRALEX ST 2.5 CRC MED (Mesh General) ×3 IMPLANT
NEEDLE SPNL 22GX3.5 QUINCKE BK (NEEDLE) ×3 IMPLANT
PAD POSITIONING PINK XL (MISCELLANEOUS) IMPLANT
PENCIL SMOKE EVACUATOR (MISCELLANEOUS) IMPLANT
PROTECTOR NERVE ULNAR (MISCELLANEOUS) IMPLANT
SCISSORS LAP 5X35 DISP (ENDOMECHANICALS) IMPLANT
SET IRRIG TUBING LAPAROSCOPIC (IRRIGATION / IRRIGATOR) IMPLANT
SET TUBE SMOKE EVAC HIGH FLOW (TUBING) ×3 IMPLANT
SHEARS HARMONIC ACE PLUS 36CM (ENDOMECHANICALS) IMPLANT
SOL ANTI FOG 6CC (MISCELLANEOUS) ×1 IMPLANT
SOLUTION ANTI FOG 6CC (MISCELLANEOUS) ×2
STRIP CLOSURE SKIN 1/2X4 (GAUZE/BANDAGES/DRESSINGS) ×4 IMPLANT
SUT NOVA NAB DX-16 0-1 5-0 T12 (SUTURE) IMPLANT
SUT VIC AB 3-0 SH 18 (SUTURE) ×3 IMPLANT
TACKER 5MM HERNIA 3.5CML NAB (ENDOMECHANICALS) IMPLANT
TAPE CLOTH 4X10 WHT NS (GAUZE/BANDAGES/DRESSINGS) IMPLANT
TOWEL OR 17X26 10 PK STRL BLUE (TOWEL DISPOSABLE) ×3 IMPLANT
TOWEL OR NON WOVEN STRL DISP B (DISPOSABLE) ×3 IMPLANT
TRAY FOLEY MTR SLVR 16FR STAT (SET/KITS/TRAYS/PACK) ×3 IMPLANT
TRAY LAPAROSCOPIC (CUSTOM PROCEDURE TRAY) ×3 IMPLANT
TROCAR BLADELESS OPT 5 100 (ENDOMECHANICALS) ×3 IMPLANT

## 2020-01-11 NOTE — Op Note (Signed)
Operative Note  Pre-operative Diagnosis:  Ventral hernia   Post-operative Diagnosis:  same  Surgeon:  Armandina Gemma, MD  Assistant:  none   Procedure:  Open repair ventral hernia with mesh patch (6.4 cm)  Anesthesia:  general  Estimated Blood Loss:  minimal  Drains: none         Specimen: none  Indications:  Patient is referred by Dr. Yaakov Guthrie for surgical evaluation and management of ventral hernia. Patient states that this is been present for approximately 2 years. It has gradually enlarged in size. It has been minimally symptomatic. He has occasional discomfort. He has noted no change in his bowel movements. Patient has had no prior abdominal surgery. He has had no prior hernia repairs. He presents today for surgical repair.  Procedure:  The patient was seen in the pre-op holding area. The risks, benefits, complications, treatment options, and expected outcomes were previously discussed with the patient. The patient agreed with the proposed plan and has signed the informed consent form.  The patient was brought to the operating room by the surgical team, identified as Steven Mckee and the procedure verified. A "time out" was completed and the above information confirmed.  Following administration of general anesthesia, the patient is prepped and draped in the usual aseptic fashion.  After ascertaining that an adequate level of anesthesia been achieved, a midline incision is made with a #10 blade over the palpable hernia.  Dissection is carried into the subcutaneous tissues.  Hernia sac is identified.  It contains preperitoneal adipose tissue.  It is dissected out of the subcutaneous tissues.  Dissection is carried down to the fascial plane.  Hernia defect is identified.  It measures approximately 2.5 cm in diameter and arises in the midline.  Tissue was reduced back into the peritoneal cavity.  Preperitoneal plane was developed circumferentially.  A Bard Ventralex ST 6.4 cm  round mesh patch is selected.  It is prepared, moistened with saline, and inserted into the preperitoneal space and deployed circumferentially.  Using 0 Novafil sutures the hernia defect is closed while incorporating the underlying mesh patch into the closure.  Good hemostasis is noted.  Local anesthetic is infiltrated circumferentially.  Subcutaneous tissues are closed with interrupted 3-0 Vicryl sutures.  Skin is anesthetized with local anesthetic.  Skin edges are reapproximated with a running 4-0 Monocryl subcuticular suture.  Wound is washed and dried and Dermabond is applied as dressing.  An abdominal binder is placed on to the patient prior to leaving the operating room.  Patient is then transported to the recovery room in stable condition.  The patient tolerated the procedure well.   Armandina Gemma, MD [facility] Office: [phone]

## 2020-01-11 NOTE — Anesthesia Postprocedure Evaluation (Signed)
Anesthesia Post Note  Patient: Steven Mckee  Procedure(s) Performed: OPEN REPAIR VENTRAL HERNIA WITH MESH PATCH (N/A )     Patient location during evaluation: PACU Anesthesia Type: General Level of consciousness: awake Pain management: pain level controlled Vital Signs Assessment: post-procedure vital signs reviewed and stable Respiratory status: spontaneous breathing Cardiovascular status: stable Postop Assessment: no apparent nausea or vomiting Anesthetic complications: no   No complications documented.  Last Vitals:  Vitals:   01/11/20 1245 01/11/20 1300  BP: 124/76 125/73  Pulse: 71 75  Resp: 11 11  Temp:    SpO2: 94% 95%    Last Pain:  Vitals:   01/11/20 1300  TempSrc:   PainSc: 0-No pain                 Leandria Thier

## 2020-01-11 NOTE — Anesthesia Procedure Notes (Signed)
Procedure Name: LMA Insertion Date/Time: 01/11/2020 11:34 AM Performed by: Genelle Bal, CRNA Pre-anesthesia Checklist: Patient identified, Emergency Drugs available, Suction available and Patient being monitored Patient Re-evaluated:Patient Re-evaluated prior to induction Oxygen Delivery Method: Circle system utilized Preoxygenation: Pre-oxygenation with 100% oxygen Induction Type: IV induction Ventilation: Mask ventilation without difficulty LMA: LMA inserted LMA Size: 4.0 Number of attempts: 1 Airway Equipment and Method: Bite block Placement Confirmation: positive ETCO2 Tube secured with: Tape Dental Injury: Teeth and Oropharynx as per pre-operative assessment

## 2020-01-11 NOTE — Anesthesia Preprocedure Evaluation (Signed)
Anesthesia Evaluation  Patient identified by MRN, date of birth, ID band Patient awake    Reviewed: Allergy & Precautions, NPO status , Patient's Chart, lab work & pertinent test results  Airway Mallampati: II  TM Distance: >3 FB     Dental   Pulmonary former smoker,    breath sounds clear to auscultation       Cardiovascular hypertension, + angina + CAD and + Past MI   Rhythm:Regular Rate:Normal     Neuro/Psych  Neuromuscular disease    GI/Hepatic negative GI ROS, Neg liver ROS,   Endo/Other  diabetes  Renal/GU negative Renal ROS     Musculoskeletal   Abdominal   Peds  Hematology   Anesthesia Other Findings   Reproductive/Obstetrics                             Anesthesia Physical Anesthesia Plan  ASA: III  Anesthesia Plan: General   Post-op Pain Management:    Induction: Intravenous  PONV Risk Score and Plan: 3 and Ondansetron, Dexamethasone and Midazolam  Airway Management Planned: Oral ETT  Additional Equipment:   Intra-op Plan:   Post-operative Plan:   Informed Consent: I have reviewed the patients History and Physical, chart, labs and discussed the procedure including the risks, benefits and alternatives for the proposed anesthesia with the patient or authorized representative who has indicated his/her understanding and acceptance.     Dental advisory given  Plan Discussed with: CRNA and Anesthesiologist  Anesthesia Plan Comments:         Anesthesia Quick Evaluation

## 2020-01-11 NOTE — Interval H&P Note (Signed)
History and Physical Interval Note:  01/11/2020 10:58 AM  Steven Mckee  has presented today for surgery, with the diagnosis of VENTRAL HERNIA.  The various methods of treatment have been discussed with the patient and family. After consideration of risks, benefits and other options for treatment, the patient has consented to    Procedure(s) with comments: [place] (N/A) - LMA as a surgical intervention.    The patient's history has been reviewed, patient examined, no change in status, stable for surgery.  I have reviewed the patient's chart and labs.  Questions were answered to the patient's satisfaction.    Armandina Gemma, MD [facility] Office: Gem

## 2020-01-11 NOTE — Transfer of Care (Signed)
Immediate Anesthesia Transfer of Care Note  Patient: Genevieve Steinhauser  Procedure(s) Performed: OPEN REPAIR VENTRAL HERNIA WITH MESH PATCH (N/A )  Patient Location: PACU  Anesthesia Type:General  Level of Consciousness: awake, alert  and oriented  Airway & Oxygen Therapy: Patient Spontanous Breathing and Patient connected to face mask oxygen  Post-op Assessment: Report given to RN and Post -op Vital signs reviewed and stable  Post vital signs: Reviewed and stable  Last Vitals:  Vitals Value Taken Time  BP 142/74   Temp    Pulse 77   Resp 14   SpO2 100%     Last Pain:  Vitals:   01/11/20 1021  TempSrc:   PainSc: 0-No pain      Patients Stated Pain Goal: 4 (97/58/83 2549)  Complications: No complications documented.

## 2020-01-11 NOTE — Discharge Instructions (Signed)
[facility]  HERNIA REPAIR POST OP INSTRUCTIONS  Always review your discharge instruction sheet given to you by the facility where your surgery was performed.  1. A  prescription for pain medication may be given to you upon discharge.  Take your pain medication as prescribed.  If narcotic pain medicine is not needed, then you may take acetaminophen (Tylenol) or ibuprofen (Advil) as needed.  2. Take your usually prescribed medications unless otherwise directed.  3. If you need a refill on your pain medication, please contact your pharmacy.  They Neema contact our office to request authorization. Prescriptions Baine not be filled after 5 pm daily or on weekends.  4. You should follow a light diet the first 24 hours after arrival home, such as soup and crackers or toast.  Be sure to include plenty of fluids daily.  Resume your normal diet the day after surgery.  5. Most patients Zaiyden experience some swelling and bruising around the surgical site.  Ice packs and reclining [place] help.  Swelling and bruising can take several days to resolve.   6. It is common to experience some constipation if taking pain medication after surgery.  Increasing fluid intake and taking a stool softener (such as Colace) Jakeob usually help or prevent this problem from occurring.  A mild laxative (Milk of Magnesia or Miralax) should be taken according to package directions if there are no bowel movements after 48 hours.  7. You Kalieb likely have Dermabond (topical glue) over your incisions.  This seals the incisions and allows you to bathe and shower at any time after your surgery.  Glue should remain in place for up to 10 days.  It may be removed after 10 days by pealing off the Dermabond material or using Vaseline or naval jelly to remove.  8. If you have steri-strips over your incisions, you may remove the gauze bandage on the second day after surgery, and you may shower at that time.  Leave your steri-strips  (small skin tapes) in place directly over the incision.  These strips should remain on the skin for 5-7 days and then be removed.  You may get them wet in the shower and pat them dry.  9. ACTIVITIES:  You may resume regular (light) daily activities beginning the next day - such as daily self-care, walking, climbing stairs - gradually increasing activities as tolerated.  You may have sexual intercourse when it is comfortable.  Refrain from any heavy lifting or straining until approved by your doctor.  You may drive when you are no longer taking prescription pain medication, when you can comfortably wear a seatbelt, and when you can safely maneuver your car and apply brakes.  10. You should see your doctor in the office for a follow-up appointment approximately 2-3 weeks after your surgery.  Make sure that you call for this appointment within a day or two after you arrive home to insure a convenient appointment time.  WHEN TO CALL YOUR DOCTOR: 1. Fever greater than 101.0 2. Inability to urinate 3. Persistent nausea and/or vomiting 4. Extreme swelling or bruising 5. Continued bleeding from incision 6. Increased pain, redness, or drainage from the incision  The clinic staff is available to answer your questions during regular business hours.  Please don't hesitate to call and ask to speak to one of the nurses for clinical concerns.  If you have a medical emergency, go to the nearest emergency room or call 911.  A surgeon from [employer]  [facility] is always on call for the hospital.   [facility] [address], [place], [place], [place]  [address]  [phone] ? [phone] ? FAX [phone]  [email or web address]

## 2020-01-12 ENCOUNTER — Encounter (HOSPITAL_COMMUNITY): Payer: Self-pay | Admitting: Surgery

## 2020-01-25 ENCOUNTER — Other Ambulatory Visit: Payer: Self-pay

## 2020-01-25 MED ORDER — LISINOPRIL-HYDROCHLOROTHIAZIDE 20-12.5 MG PO TABS: 1.0000 | ORAL_TABLET | Freq: Every day | ORAL | 2 refills | Status: DC

## 2020-04-01 ENCOUNTER — Ambulatory Visit: Payer: Medicare Other | Attending: Internal Medicine

## 2020-04-01 DIAGNOSIS — Z23 Encounter for immunization: Secondary | ICD-10-CM

## 2020-04-01 NOTE — Progress Notes (Signed)
   Covid-19 Vaccination Clinic  Name:  Jaydyn Bozzo    MRN: [id number] DOB: 21-Jul-1950  04/01/2020  Mr. Vallone was observed post Covid-19 immunization for 15 minutes without incident. He was provided with Vaccine Information Sheet and instruction to access the V-Safe system.   Mr. Buist was instructed to call 911 with any severe reactions post vaccine: Mar[place] Kitchen Difficulty breathing  . Swelling of face and throat  . A fast heartbeat  . A bad rash all over body  . Dizziness and weakness

## 2020-06-13 DIAGNOSIS — E1159 Type 2 diabetes mellitus with other circulatory complications: Secondary | ICD-10-CM | POA: Diagnosis not present

## 2020-06-13 DIAGNOSIS — E7800 Pure hypercholesterolemia, unspecified: Secondary | ICD-10-CM | POA: Diagnosis not present

## 2020-06-16 ENCOUNTER — Other Ambulatory Visit: Payer: Self-pay | Admitting: *Deleted

## 2020-06-16 DIAGNOSIS — I10 Essential (primary) hypertension: Secondary | ICD-10-CM | POA: Diagnosis not present

## 2020-06-16 DIAGNOSIS — E1159 Type 2 diabetes mellitus with other circulatory complications: Secondary | ICD-10-CM | POA: Diagnosis not present

## 2020-06-16 DIAGNOSIS — Z7984 Long term (current) use of oral hypoglycemic drugs: Secondary | ICD-10-CM | POA: Diagnosis not present

## 2020-06-16 DIAGNOSIS — E7800 Pure hypercholesterolemia, unspecified: Secondary | ICD-10-CM | POA: Diagnosis not present

## 2020-06-16 DIAGNOSIS — I2510 Atherosclerotic heart disease of native coronary artery without angina pectoris: Secondary | ICD-10-CM | POA: Diagnosis not present

## 2020-06-16 DIAGNOSIS — Z87891 Personal history of nicotine dependence: Secondary | ICD-10-CM

## 2020-07-18 ENCOUNTER — Other Ambulatory Visit: Payer: Self-pay

## 2020-07-18 ENCOUNTER — Encounter: Payer: Self-pay | Admitting: Acute Care

## 2020-07-18 ENCOUNTER — Ambulatory Visit (INDEPENDENT_AMBULATORY_CARE_PROVIDER_SITE_OTHER): Payer: Medicare Other | Admitting: Acute Care

## 2020-07-18 ENCOUNTER — Ambulatory Visit (INDEPENDENT_AMBULATORY_CARE_PROVIDER_SITE_OTHER)
Admission: RE | Admit: 2020-07-18 | Discharge: 2020-07-18 | Disposition: A | Discharge status: Home / Self Care | Payer: Medicare Other | Source: Ambulatory Visit | Attending: Family Medicine | Admitting: Family Medicine

## 2020-07-18 DIAGNOSIS — Z122 Encounter for screening for malignant neoplasm of respiratory organs: Secondary | ICD-10-CM | POA: Diagnosis not present

## 2020-07-18 DIAGNOSIS — Z87891 Personal history of nicotine dependence: Secondary | ICD-10-CM | POA: Diagnosis not present

## 2020-07-18 IMAGING — CT CT CHEST LUNG CANCER SCREENING LOW DOSE W/O CM
2 of 5 series · 15 of 40 positions shown, 18 images · non-contrast
Comparison: None.

CLINICAL DATA: 69-year-old male former smoker, quit 12 years ago,
with 45 pack-year history of smoking, for initial lung cancer
screening

EXAM:
CT CHEST WITHOUT CONTRAST LOW-DOSE FOR LUNG CANCER SCREENING
TECHNIQUE: Multidetector CT imaging of the chest was performed following the
standard protocol without IV contrast.

[Series 3: lung thins 1.0 · axial · 0.87mm/px · z∈[-299,-28]mm · 12 of 301 slices shown, 15 images]
[im 15/301  mediastinal]
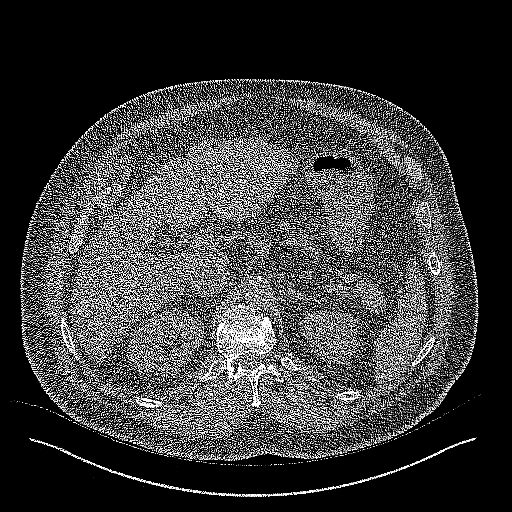
[im 15/301  lung]
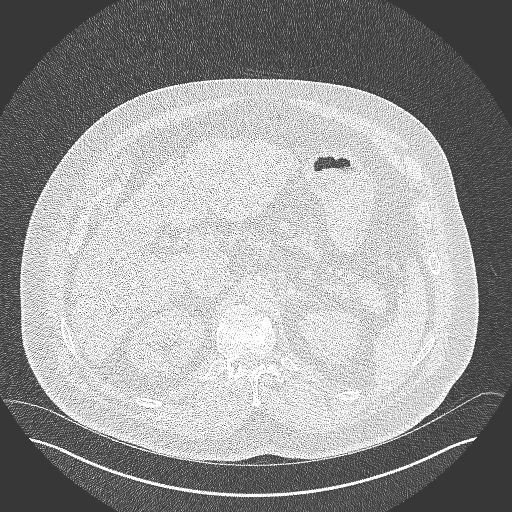
[im 43/301  lung]
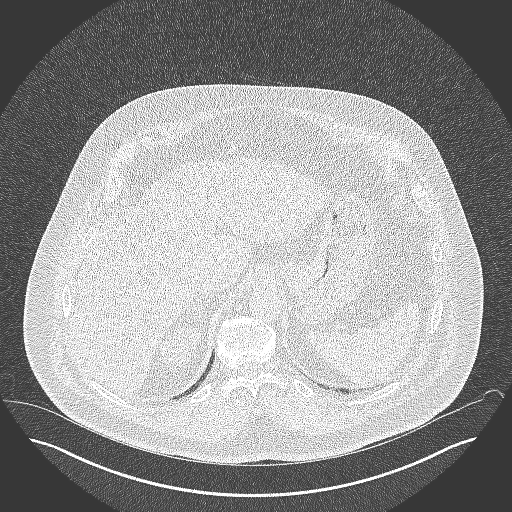
[im 72/301  lung]
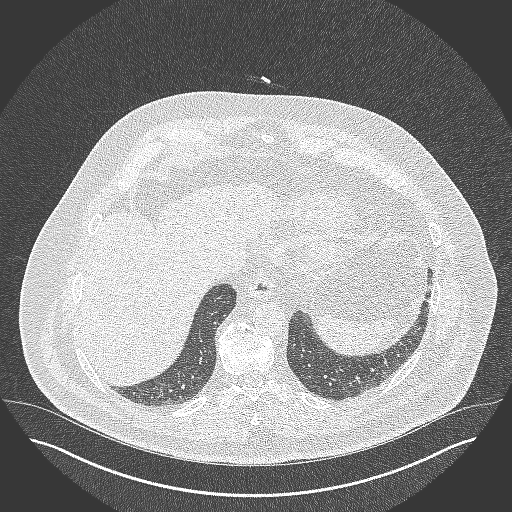
[im 86/301  lung]
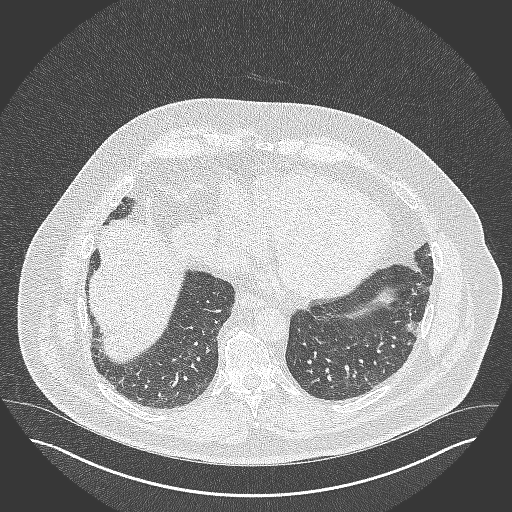
[im 115/301  mediastinal]
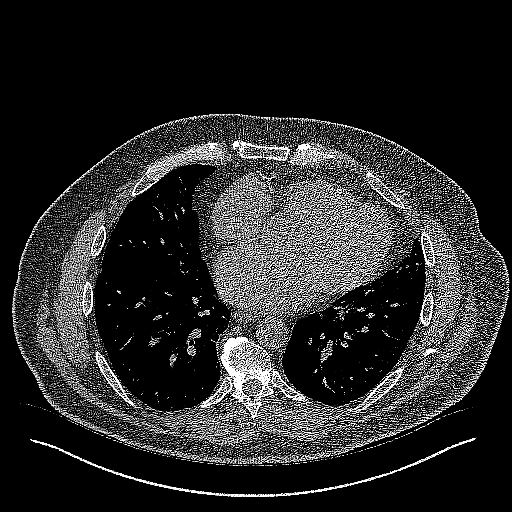
[im 115/301  lung]
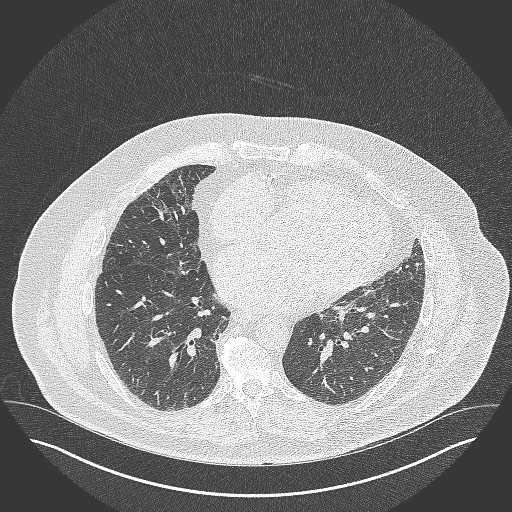
[im 143/301  lung]
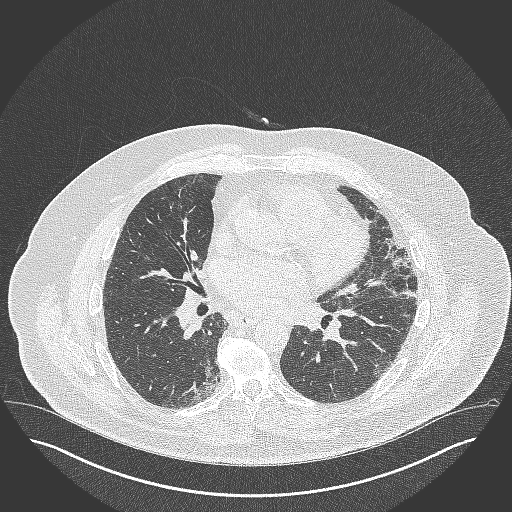
[im 158/301  lung]
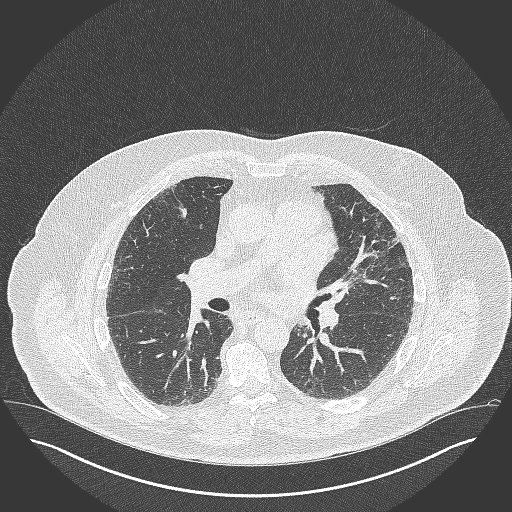
[im 186/301  lung]
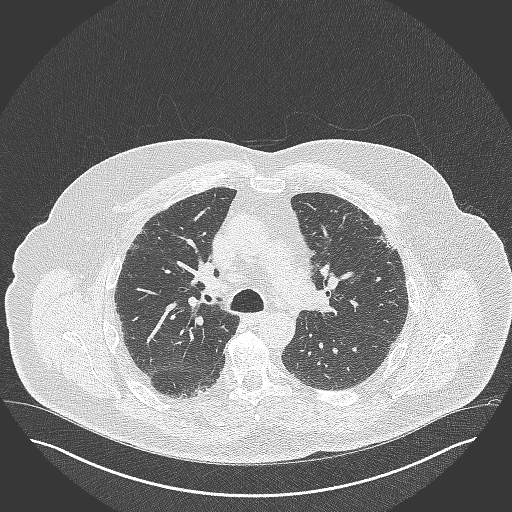
[im 215/301  mediastinal]
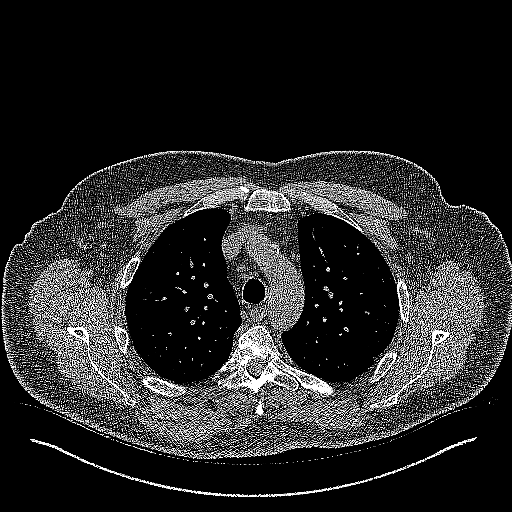
[im 215/301  lung]
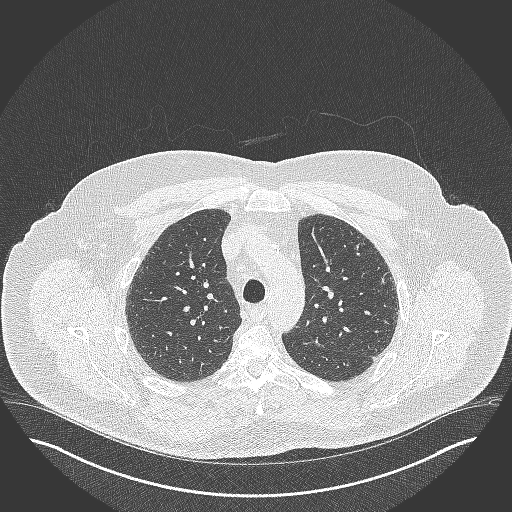
[im 229/301  lung]
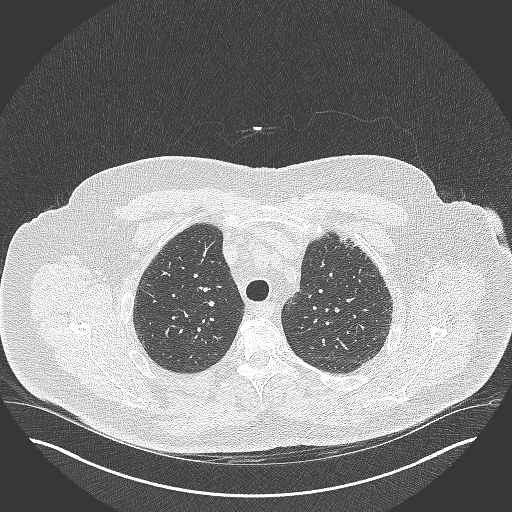
[im 258/301  lung]
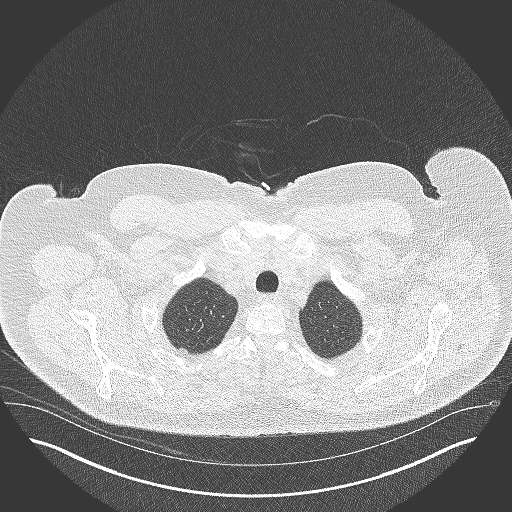
[im 286/301  lung]
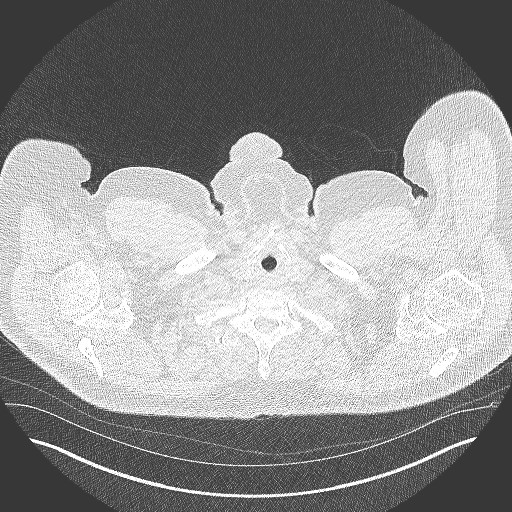

[Series 5: coronal · coronal · 0.59mm/px · 3 of 151 slices shown]
[im 31/151  lung]
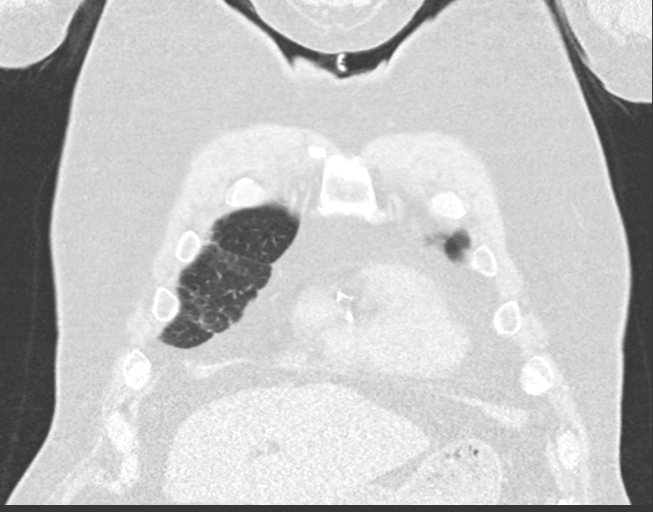
[im 61/151  lung]
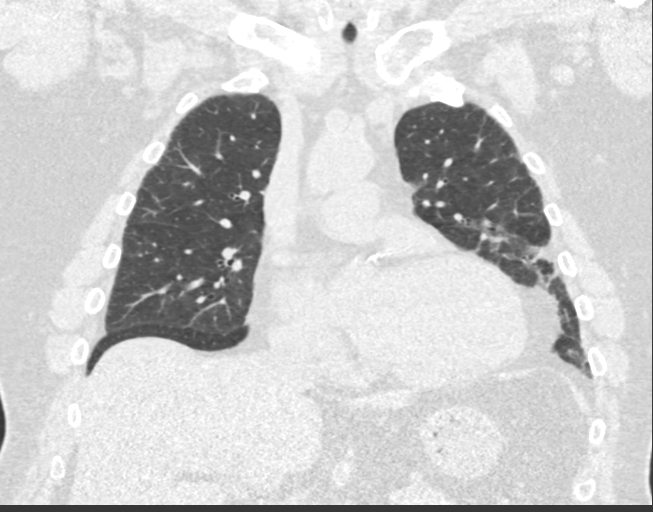
[im 91/151  lung]
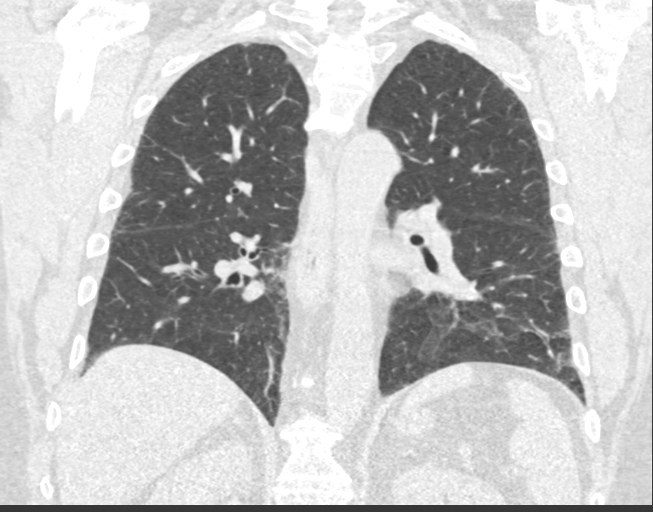

[15 of 40 positions shown; findings below may reference images not displayed]

FINDINGS: Cardiovascular: Heart is normal in size.  No pericardial effusion.

No evidence of thoracic aortic aneurysm. Mild atherosclerotic
calcifications of the aortic arch.

Three vessel coronary atherosclerosis.

Mediastinum/Nodes: No suspicious mediastinal lymphadenopathy.

Visualized thyroid is unremarkable.

Lungs/Pleura: Mild biapical pleural-parenchymal scarring.

Subpleural reticulation/fibrosis with interlobular septal thickening
in the lungs bilaterally, lower lobe predominant, favoring chronic
interstitial lung disease.

Underlying mild centrilobular emphysematous changes, upper lung
predominant.

No focal consolidation.

No pleural effusion or pneumothorax.

Upper Abdomen: Visualized upper abdomen is notable for
cholelithiasis and mild vascular calcifications.

Musculoskeletal: Degenerative changes of the visualized
thoracolumbar spine.
IMPRESSION: Lung-RADS 1, negative. Continue annual screening with low-dose chest
CT without contrast in 12 months.

Aortic Atherosclerosis (UCBCK-9YS.S) and Emphysema ([id number].T).

## 2020-07-18 NOTE — Progress Notes (Addendum)
Virtual Visit via Video Note  I connected with Steven Mckee on 07/18/20 at  3:00 PM EST by a video enabled telemedicine application and verified that I am speaking with the correct person using two identifiers.  Location: Patient: At home Provider: At home   I discussed the limitations of evaluation and management by telemedicine and the availability of in person appointments. The patient expressed understanding and agreed to proceed.    Shared Decision Making Visit Lung Cancer Screening Program [phone])   Eligibility:  Age 70 y.o.  Pack Years Smoking History Calculation 45 pack year smoking history (# packs/per year x # years smoked)  Recent History of coughing up blood  no  Unexplained weight loss? no ( >Than 15 pounds within the last 6 months )  Prior History Lung / other cancer no (Diagnosis within the last 5 years already requiring surveillance chest CT Scans).  Smoking Status Former Smoker  Former Smokers: Years since quit:10 years  Quit [id number]  Visit Components:  Discussion included one or more decision making aids. yes  Discussion included risk/benefits of screening. yes  Discussion included potential follow up diagnostic testing for abnormal scans. yes  Discussion included meaning and risk of over diagnosis. yes  Discussion included meaning and risk of False Positives. yes  Discussion included meaning of total radiation exposure. yes  Counseling Included:  Importance of adherence to annual lung cancer LDCT screening. yes  Impact of comorbidities on ability to participate in the program. yes  Ability and willingness to under diagnostic treatment. yes  Smoking Cessation Counseling:  Current Smokers:   Discussed importance of smoking cessation. yes  Information about tobacco cessation classes and interventions provided to patient. yes  Patient provided with "ticket" for LDCT Scan. yes  Symptomatic Patient. no  Counseling  Diagnosis  Code: Tobacco Use Z72.0  Asymptomatic Patient  yes  Counseling (Intermediate counseling: > three minutes counseling) G9562  Former Smokers:   Discussed the importance of maintaining cigarette abstinence. yes  Diagnosis Code: Personal History of Nicotine Dependence. Z30.865  Information about tobacco cessation classes and interventions provided to patient. Yes  Patient provided with "ticket" for LDCT Scan. yes  Written Order for Lung Cancer Screening with LDCT placed in Epic. Yes (CT Chest Lung Cancer Screening Low Dose W/O CM) [id number] Z12.2-Screening of respiratory organs Z87.891-Personal history of nicotine dependence  I spent 25 minutes of face to face time with Steven Mckee discussing the risks and benefits of lung cancer screening. We viewed a power point together that explained in detail the above noted topics. We took the time to pause the power point at intervals to allow for questions to be asked and answered to ensure understanding. We discussed that he had taken the single most powerful action possible to decrease his risk of developing lung cancer when he quit smoking. I counseled him to remain smoke free, and to contact me if he ever had the desire to smoke again so that I can provide resources and tools to help support the effort to remain smoke free. We discussed the time and location of the scan, and that either  Steven Glassman RN or I Siddharth call with the results within  24-48 hours of receiving them. He has my card and contact information in the event he needs to speak with me, in addition to a copy of the power point we reviewed as a resource. He  verbalized understanding of all of the above and had no further questions upon leaving the office.  I explained to the patient that there has been a high incidence of coronary artery disease noted on these exams. I explained that this is a non-gated exam therefore degree or severity cannot be determined. This patient is Currently  on statin therapy. I have asked the patient to follow-up with their PCP regarding any incidental finding of coronary artery disease and management with diet or medication as they feel is clinically indicated. The patient verbalized understanding of the above and had no further questions.     Magdalen Spatz, NP 07/18/2020

## 2020-07-18 NOTE — Patient Instructions (Signed)
Thank you for participating in the [facility]. It was our pleasure to meet you today. We Saunders call you with the results of your scan within the next few days. Your scan Jaylynn be assigned a Lung RADS category score by the physicians reading the scans.  This Lung RADS score determines follow up scanning.  See below for description of categories, and follow up screening recommendations. We Kashmere be in touch to schedule your follow up screening annually or based on recommendations of our providers. We Tejuan fax a copy of your scan results to your Primary Care Physician, or the physician who referred you to the program, to ensure they have the results. Please call the office if you have any questions or concerns regarding your scanning experience or results.  Our office number is [phone]. Please speak with Denise Phelps, RN. She is our Lung Cancer Screening RN. If she is unavailable when you call, please have the office staff send her a message. She Ranald return your call at her earliest convenience. Remember, if your scan is normal, we Fed scan you annually as long as you continue to meet the criteria for the program. (Age 55-77, Current smoker or smoker who has quit within the last 15 years). If you are a smoker, remember, quitting is the single most powerful action that you can take to decrease your risk of lung cancer and other pulmonary, breathing related problems. We know quitting is hard, and we are here to help.  Please let us know if there is anything we can do to help you meet your goal of quitting. If you are a former smoker, congratulations. We are proud of you! Remain smoke free! Remember you can refer friends or family members through the number above.  We Devaris screen them to make sure they meet criteria for the program. Thank you for helping us take better care of you by participating in Lung Screening.  Lung RADS Categories:  Lung RADS 1: no nodules  or definitely non-concerning nodules.  Recommendation is for a repeat annual scan in 12 months.  Lung RADS 2:  nodules that are non-concerning in appearance and behavior with a very low likelihood of becoming an active cancer. Recommendation is for a repeat annual scan in 12 months.  Lung RADS 3: nodules that are probably non-concerning , includes nodules with a low likelihood of becoming an active cancer.  Recommendation is for a 6-month repeat screening scan. Often noted after an upper respiratory illness. We Soma be in touch to make sure you have no questions, and to schedule your 6-month scan.  Lung RADS 4 A: nodules with concerning findings, recommendation is most often for a follow up scan in 3 months or additional testing based on our provider's assessment of the scan. We Sachin be in touch to make sure you have no questions and to schedule the recommended 3 month follow up scan.  Lung RADS 4 B:  indicates findings that are concerning. We Jden be in touch with you to schedule additional diagnostic testing based on our provider's  assessment of the scan.   

## 2020-07-21 NOTE — Progress Notes (Signed)
Please call patient and let them  know their  low dose Ct was read as a Lung RADS 1, negative study: no nodules or definitely benign nodules. Radiology recommendation is for a repeat LDCT in 12 months. .Please let them  know we Masoud order and schedule their  annual screening scan for 07/2021. Please let them  know there was notation of CAD on their  scan.  Please remind the patient  that this is a non-gated exam therefore degree or severity of disease  cannot be determined. Please have them  follow up with their PCP regarding potential risk factor modification, dietary therapy or pharmacologic therapy if clinically indicated. Pt.  is  currently on statin therapy. Please place order for annual  screening scan for  07/2021 and fax results to PCP. Thanks so much.  This patient is already followed by cards.

## 2020-07-22 ENCOUNTER — Other Ambulatory Visit: Payer: Self-pay | Admitting: *Deleted

## 2020-07-22 DIAGNOSIS — Z87891 Personal history of nicotine dependence: Secondary | ICD-10-CM

## 2020-09-23 ENCOUNTER — Ambulatory Visit (HOSPITAL_COMMUNITY): Payer: Medicare Other | Attending: Cardiology

## 2020-09-23 ENCOUNTER — Other Ambulatory Visit: Payer: Self-pay

## 2020-09-23 DIAGNOSIS — I340 Nonrheumatic mitral (valve) insufficiency: Secondary | ICD-10-CM

## 2020-09-23 LAB — ECHOCARDIOGRAM COMPLETE
Area-P 1/2: 3.17 cm2
S' Lateral: 2.9 cm

## 2020-09-29 DIAGNOSIS — R051 Acute cough: Secondary | ICD-10-CM | POA: Diagnosis not present

## 2020-09-29 DIAGNOSIS — J069 Acute upper respiratory infection, unspecified: Secondary | ICD-10-CM | POA: Diagnosis not present

## 2020-10-11 DIAGNOSIS — Z1211 Encounter for screening for malignant neoplasm of colon: Secondary | ICD-10-CM | POA: Diagnosis not present

## 2020-10-11 DIAGNOSIS — D122 Benign neoplasm of ascending colon: Secondary | ICD-10-CM | POA: Diagnosis not present

## 2020-10-11 DIAGNOSIS — K5730 Diverticulosis of large intestine without perforation or abscess without bleeding: Secondary | ICD-10-CM | POA: Diagnosis not present

## 2020-10-14 DIAGNOSIS — D122 Benign neoplasm of ascending colon: Secondary | ICD-10-CM | POA: Diagnosis not present

## 2020-10-19 ENCOUNTER — Other Ambulatory Visit: Payer: Self-pay | Admitting: Interventional Cardiology

## 2020-10-19 NOTE — Progress Notes (Signed)
New Patient Note  RE: Steven Mckee MRN: [id number] DOB: 23-Aug-1950 Date of Office Visit: 10/20/2020  Consult requested by: No ref. provider found Primary care provider: [name], MD  Chief Complaint: Allergies (Exposed to black mold a year ago and has had the sniffles since then, says he could feel it moving down to his lungs.)  History of Present Illness: I had the pleasure of seeing Steven Mckee for initial evaluation at the [facility] [place] [facility] on 10/20/2020. He is a 70 y.o. male, who is self-referred here for the evaluation of allergic rhinitis/mold exposure.  Patient was in [place] a few weeks ago and was given steroid injection, prednisone, antibiotics (cefuroxime 500mg  BID x 7 days) for upper respiratory infection which helped greatly but now noticing that the coughing is slowly returning.   He reports symptoms of rhinorrhea mainly every spring which would move to his chest with coughing and mucous production. This has been happening less frequently since he started to take benadryl at the first sign of rhinorrhea.  Last year patient was at his friend's condo which had mold issues and now concerned if that has anything to do with his worsening rhinorrhea and coughing. Patient is also on lisinopril for hypertension.   Symptoms have been going on for many years. The symptoms are present mainly in the spring. Anosmia: no. Headache: no. He has used benadryl, zyrtec with fair improvement in symptoms. Sinus infections: none. Previous work up includes: no. Previous ENT evaluation: no. Previous sinus imaging: no. History of nasal polyps: no. History of reflux: no.  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 Does not wear a mask.  Assessment and Plan: Jowell is a 70 y.o. male with: Other allergic rhinitis Rhinitis symptoms for many years mainly in the spring which then causes bronchitis-like symptoms.  Has  been using Benadryl and Zyrtec with good benefit.  Also concerned about mold allergies. No prior allergy/ENT evaluation.  Today's skin testing showed: Positive to tree pollen only, negative to molds. Results given.  Start environmental control measures as below.  May use over the counter antihistamines such as Zyrtec (cetirizine), Claritin (loratadine), Allegra (fexofenadine), or Xyzal (levocetirizine) daily as needed.  Start using daily from middle February through May during tree pollen season.   May use azelastine nasal spray 1-2 sprays per nostril twice a day as needed for runny nose/drainage.  May use Flonase (fluticasone) nasal spray 1 spray per nostril twice a day as needed for nasal congestion.   Nasal saline spray (i.e., Simply Saline) or nasal saline lavage (i.e., NeilMed) is recommended as needed and prior to medicated nasal sprays.  Use saline spray after working with the sawdust.  Consider allergy injections for long term control if above medications do not help the symptoms - handout given.   Coughing Noticed some coughing with mucus production at times.  Recently needed antibiotics and prednisone which helped.  No prior inhaler use or asthma diagnosis. Denies reflux symptoms. Ex-smoker. Occupational exposure to saw dust.   Today's spirometry showed some mild restriction with 5% improvement in FEV1 post bronchodilator treatment.  Clinically feeling unchanged.  Monitor symptoms - concern if coughing is mainly from PND/sawdust exposure.   If symptoms do not improve - then consider CXR and stopping lisinopril.   Return in about 11 months (around 09/20/2021).  Meds ordered this encounter  Medications  . fluticasone (FLONASE) 50 MCG/ACT nasal  spray    Sig: Place 1 spray into both nostrils 2 (two) times daily as needed.    Dispense:  16 g    Refill:  5  . azelastine (ASTELIN) 0.1 % nasal spray    Sig: Place 1-2 sprays into both nostrils 2 (two) times daily as needed. Use  in each nostril as directed    Dispense:  30 mL    Refill:  5   Lab Orders  No laboratory test(s) ordered today    Other allergy screening: Asthma: no Food allergy: no Medication allergy: no Hymenoptera allergy: no Urticaria: no Eczema:no History of recurrent infections suggestive of immunodeficency: no  Diagnostics: Spirometry:  Tracings reviewed. His effort: It was hard to get consistent efforts and there is a question as to whether this reflects a maximal maneuver. FVC: 3.30L FEV1: 2.70L, 65% predicted FEV1/FVC ratio: 82% Interpretation: Spirometry consistent with possible restrictive disease with 5% improvement in FEV1 post bronchodilator treatment. Clinically feeling unchanged.   Please see scanned spirometry results for details.  Skin Testing: Environmental allergy panel. Positive test to: tree pollen.  Results discussed with patient/family.  Airborne Adult Perc - 10/20/20 0909    Time Antigen Placed [id number]    Allergen Manufacturer Lavella Hammock    Location Back    Number of Test 59    Panel 1 Select    1. Control-Buffer 50% Glycerol Negative    2. Control-Histamine 1 mg/ml 2+    3. Albumin saline Negative    4. Hubbard Negative    5. Guatemala Negative    6. Johnson Negative    7. [place] Blue Negative    8. Meadow Fescue Negative    9. Perennial Rye Negative    10. Sweet Vernal Negative    11. Timothy Negative    12. Cocklebur Negative    13. Burweed Marshelder Negative    14. Ragweed, short Negative    15. Ragweed, Giant Negative    16. Plantain,  English Negative    17. Lamb's Quarters Negative    18. Sheep Sorrell Negative    19. Rough Pigweed Negative    20. Marsh Elder, Rough Negative    21. Mugwort, Common Negative    22. Ash mix Negative    23. Birch mix Negative    24. Beech American Negative    25. Box, Elder Negative    26. Cedar, red 3+    27. Cottonwood, [place] Negative    28. Elm mix Negative    29. Hickory Negative    30. Maple mix Negative     31. Oak, [place] mix Negative    32. Pecan Pollen Negative    33. Pine mix Negative    34. Sycamore Eastern Negative    35. Harvey, Black Pollen Negative    36. Alternaria alternata Negative    37. Cladosporium Herbarum Negative    38. Aspergillus mix Negative    39. Penicillium mix Negative    40. Bipolaris sorokiniana (Helminthosporium) Negative    41. Drechslera spicifera (Curvularia) Negative    42. Mucor plumbeus Negative    43. Fusarium moniliforme Negative    44. Aureobasidium pullulans (pullulara) Negative    45. Rhizopus oryzae Negative    46. Botrytis cinera Negative    47. Epicoccum nigrum Negative    48. Phoma betae Negative    49. Candida Albicans Negative    50. Trichophyton mentagrophytes Negative    51. Mite, D Farinae  5,000 AU/ml Negative    52. Mite,  D Pteronyssinus  5,000 AU/ml Negative    53. Cat Hair 10,000 BAU/ml Negative    54.  Dog Epithelia Negative    55. Mixed Feathers Negative    56. Horse Epithelia Negative    57. Cockroach, German Negative    58. Mouse Negative    59. Tobacco Leaf Negative          Intradermal - 10/20/20 1000    Time Antigen Placed 1000    Allergen Manufacturer Greer    Location Back    Number of Test 14    Control Negative    Guatemala Negative    Johnson Negative    7 Grass Negative    Ragweed mix Negative    Weed mix Negative    Mold 1 Negative    Mold 2 Negative    Mold 3 Negative    Mold 4 Negative    Cat Negative    Dog Negative    Cockroach Negative    Mite mix Negative           Past Medical History: Patient Active Problem List   Diagnosis Date Noted  . Other allergic rhinitis 10/20/2020  . Coughing 10/20/2020  . Umbilical hernia 32/35/5732  . Ventral hernia 01/10/2020  . Rectus diastasis 01/10/2020  . Mitral valvular regurgitation 03/02/2014  . Hyperlipidemia 03/02/2014  . Coronary artery disease involving native coronary artery of native heart with angina pectoris ([place])   . Metabolic syndrome    . Pain in joint, shoulder region 10/28/2012  . Left knee pain 10/28/2012  . HTN (hypertension) 10/28/2012  . Obesity, unspecified 10/28/2012   Past Medical History:  Diagnosis Date  . Angio-edema   . CAD (coronary artery disease)   . Coronary artery disease   . Diabetes mellitus without complication ([place])    type 2  . HTN (hypertension) 10/28/2012  . Hypercholesteremia   . Hyperlipidemia 03/02/2014  . Low testosterone   . Metabolic syndrome   . Mitral valve regurgitation   . Myocardial infarction ([place])   . Obesity   . Obesity    Past Surgical History: Past Surgical History:  Procedure Laterality Date  . COLONOSCOPY    . SKIN SURGERY     Mohs  . VENTRAL HERNIA REPAIR N/A 01/11/2020   Procedure: OPEN REPAIR VENTRAL HERNIA WITH MESH PATCH;  Surgeon: Armandina Gemma, MD;  Location: [facility];  Service: General;  Laterality: N/A;  LMA   Medication List:  Current Outpatient Medications  Medication Sig Dispense Refill  . aspirin EC 81 MG tablet Take 1 tablet (81 mg total) by mouth daily. 90 tablet 3  . azelastine (ASTELIN) 0.1 % nasal spray Place 1-2 sprays into both nostrils 2 (two) times daily as needed. Use in each nostril as directed 30 mL 5  . diphenhydrAMINE (BENADRYL) 25 MG tablet Take 25 mg by mouth every 6 (six) hours as needed for allergies.    . fluticasone (FLONASE) 50 MCG/ACT nasal spray Place 1 spray into both nostrils 2 (two) times daily as needed. 16 g 5  . halobetasol (ULTRAVATE) 0.05 % ointment Apply 1 application topically daily as needed. (contact dermatitis)    . ibuprofen (ADVIL) 200 MG tablet Take 200-400 mg by mouth daily as needed for headache or moderate pain.    Marland Kitchen lisinopril-hydrochlorothiazide (ZESTORETIC) 20-12.5 MG tablet Take 1 tablet by mouth daily. Pt needs to keep upcoming appt in June for further refills 30 tablet 1  . metFORMIN (GLUCOPHAGE) 500 MG tablet Take 500 mg  by mouth daily with breakfast.    . rosuvastatin (CRESTOR) 5 MG tablet Take 1 tablet (5  mg total) by mouth daily. Pt needs to keep upcoming appt in June for further refills 30 tablet 1  . traMADol (ULTRAM) 50 MG tablet Take 1-2 tablets (50-100 mg total) by mouth every 6 (six) hours as needed. 24 tablet 0   No current facility-administered medications for this visit.   Facility-Administered Medications Ordered in Other Visits  Medication Dose Route Frequency Provider Last Rate Last Admin  . regadenoson (LEXISCAN) injection SOLN 0.4 mg  0.4 mg Intravenous Once Nahser, Wonda Cheng, MD       Allergies: No Known Allergies Social History: Social History   Socioeconomic History  . Marital status: Married    Spouse name: Not on file  . Number of children: Not on file  . Years of education: Not on file  . Highest education level: Not on file  Occupational History  . Not on file  Tobacco Use  . Smoking status: Former Smoker    Quit date: 03/05/2009    Years since quitting: 11.6  . Smokeless tobacco: Never Used  Vaping Use  . Vaping Use: Never used  Substance and Sexual Activity  . Alcohol use: Not Currently    Alcohol/week: 0.0 standard drinks    Comment: Occasional  . Drug use: Never  . Sexual activity: Never  Other Topics Concern  . Not on file  Social History Narrative  . Not on file   Social Determinants of Health   Financial Resource Strain: Not on file  Food Insecurity: Not on file  Transportation Needs: Not on file  Physical Activity: Not on file  Stress: Not on file  Social Connections: Not on file   Lives in a 70 year old house. 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  Environmental History: Water Damage/mildew in the house: no Charity fundraiser in the family room: no Carpet in the bedroom: yes Heating: electric Cooling: central Pet: no  Family History: Family History  Problem Relation Age of Onset  . Dementia Mother   . Allergic rhinitis Mother   . Heart disease Father   . Stroke  Father   . Allergic rhinitis Sister   . Asthma Neg Hx   . Eczema Neg Hx   . Urticaria Neg Hx    Review of Systems  Constitutional: Negative for appetite change, chills, fever and unexpected weight change.  HENT: Positive for rhinorrhea. Negative for congestion.   Eyes: Negative for itching.  Respiratory: Positive for cough. Negative for chest tightness, shortness of breath and wheezing.   Cardiovascular: Negative for chest pain.  Gastrointestinal: Negative for abdominal pain.  Genitourinary: Negative for difficulty urinating.  Skin: Negative for rash.  Allergic/Immunologic: Positive for environmental allergies.  Neurological: Negative for headaches.   Objective: BP 118/60 (BP Location: Right Arm, Patient Position: Sitting, Cuff Size: Large)   Pulse 81   Temp (!) 97.3 F (36.3 C) (Temporal)   Resp 16   Ht 5' 10.5" (1.791 m)   Wt 256 lb (116.1 kg)   SpO2 99%   BMI 36.21 kg/m  Body mass index is 36.21 kg/m. Physical Exam Vitals and nursing note reviewed.  Constitutional:      Appearance: Normal appearance. He is well-developed.  HENT:     Head: Normocephalic and atraumatic.     Right Ear: External ear normal.     Left Ear: External  ear normal.     Nose: Nose normal.     Mouth/Throat:     Mouth: Mucous membranes are moist.     Pharynx: Oropharynx is clear.  Eyes:     Conjunctiva/sclera: Conjunctivae normal.  Cardiovascular:     Rate and Rhythm: Normal rate and regular rhythm.     Heart sounds: Normal heart sounds. No murmur heard. No friction rub. No gallop.   Pulmonary:     Effort: Pulmonary effort is normal.     Breath sounds: Normal breath sounds. No wheezing, rhonchi or rales.  Abdominal:     Palpations: Abdomen is soft.  Musculoskeletal:     Cervical back: Neck supple.  Skin:    General: Skin is warm.     Findings: No rash.  Neurological:     Mental Status: He is alert and oriented to person, place, and time.  Psychiatric:        Behavior: Behavior  normal.    The plan was reviewed with the patient/family, and all questions/concerned were addressed.  It was my pleasure to see Herny today and participate in his care. Please feel free to contact me with any questions or concerns.  Sincerely,  Rexene Alberts, DO Allergy & Immunology  [facility] office: [place] office: [phone]

## 2020-10-20 ENCOUNTER — Other Ambulatory Visit: Payer: Self-pay

## 2020-10-20 ENCOUNTER — Encounter: Payer: Self-pay | Admitting: Allergy

## 2020-10-20 ENCOUNTER — Ambulatory Visit (INDEPENDENT_AMBULATORY_CARE_PROVIDER_SITE_OTHER): Payer: Medicare Other | Admitting: Allergy

## 2020-10-20 VITALS — BP 118/60 | HR 81 | Temp 97.3°F | Resp 16 | Ht 70.5 in | Wt 256.0 lb

## 2020-10-20 DIAGNOSIS — R059 Cough, unspecified: Secondary | ICD-10-CM | POA: Diagnosis not present

## 2020-10-20 DIAGNOSIS — J3089 Other allergic rhinitis: Secondary | ICD-10-CM

## 2020-10-20 MED ORDER — FLUTICASONE PROPIONATE 50 MCG/ACT NA SUSP: 1.0000 | Freq: Two times a day (BID) | NASAL | 5 refills | Status: DC | PRN

## 2020-10-20 MED ORDER — AZELASTINE HCL 0.1 % NA SOLN: 1.0000 | Freq: Two times a day (BID) | NASAL | 5 refills | Status: DC | PRN

## 2020-10-20 NOTE — Patient Instructions (Addendum)
Today's skin testing showed: Positive to tree pollen. Results given.  Environmental allergies  Start environmental control measures as below.  May use over the counter antihistamines such as Zyrtec (cetirizine), Claritin (loratadine), Allegra (fexofenadine), or Xyzal (levocetirizine) daily as needed.  Start using daily from middle February through May during tree pollen season.   May use azelastine nasal spray 1-2 sprays per nostril twice a day as needed for runny nose/drainage.  May use Flonase (fluticasone) nasal spray 1 spray per nostril twice a day as needed for nasal congestion.   Nasal saline spray (i.e., Simply Saline) or nasal saline lavage (i.e., NeilMed) is recommended as needed and prior to medicated nasal sprays.  Use saline spray after working with the sawdust.  Consider allergy injections for long term control if above medications do not help the symptoms - handout given.   Coughing   Breathing test looked okay today.  Monitor symptoms.  If worsening, may need further work up in the future.   Follow up in 11 months or sooner if needed.   Reducing Pollen Exposure . Pollen seasons: trees (spring), grass (summer) and ragweed/weeds (fall). Marland Kitchen Keep windows closed in your home and car to lower pollen exposure.  Susa Simmonds air conditioning in the bedroom and throughout the house if possible.  . Avoid going out in dry windy days - especially early morning. . Pollen counts are highest between 5 - 10 AM and on dry, hot and windy days.  . Save outside activities for late afternoon or after a heavy rain, when pollen levels are lower.  . Avoid mowing of grass if you have grass pollen allergy. Marland Kitchen Be aware that pollen can also be transported indoors on people and pets.  . Dry your clothes in an automatic dryer rather than hanging them outside where they might collect pollen.  . Rinse hair and eyes before bedtime.

## 2020-10-20 NOTE — Assessment & Plan Note (Addendum)
Noticed some coughing with mucus production at times.  Recently needed antibiotics and prednisone which helped.  No prior inhaler use or asthma diagnosis. Denies reflux symptoms. Ex-smoker. Occupational exposure to saw dust.   Today's spirometry showed some mild restriction with 5% improvement in FEV1 post bronchodilator treatment.  Clinically feeling unchanged.  Monitor symptoms - concern if coughing is mainly from PND/sawdust exposure.   If symptoms do not improve - then consider CXR and stopping lisinopril.

## 2020-10-20 NOTE — Assessment & Plan Note (Signed)
Rhinitis symptoms for many years mainly in the spring which then causes bronchitis-like symptoms.  Has been using Benadryl and Zyrtec with good benefit.  Also concerned about mold allergies. No prior allergy/ENT evaluation.  Today's skin testing showed: Positive to tree pollen only, negative to molds. Results given.  Start environmental control measures as below.  May use over the counter antihistamines such as Zyrtec (cetirizine), Claritin (loratadine), Allegra (fexofenadine), or Xyzal (levocetirizine) daily as needed.  Start using daily from middle February through May during tree pollen season.   May use azelastine nasal spray 1-2 sprays per nostril twice a day as needed for runny nose/drainage.  May use Flonase (fluticasone) nasal spray 1 spray per nostril twice a day as needed for nasal congestion.   Nasal saline spray (i.e., Simply Saline) or nasal saline lavage (i.e., NeilMed) is recommended as needed and prior to medicated nasal sprays.  Use saline spray after working with the sawdust.  Consider allergy injections for long term control if above medications do not help the symptoms - handout given.

## 2020-11-10 ENCOUNTER — Ambulatory Visit: Payer: No Typology Code available for payment source | Admitting: Allergy

## 2020-12-05 NOTE — Progress Notes (Signed)
Cardiology Office Note:    Date:  12/07/2020   ID:  Steven Mckee, DOB 1950/07/17, MRN [id number]  PCP:  Steven Shanks, MD  Cardiologist:  Steven Grooms, MD   Referring MD: Steven Shanks, MD   Chief Complaint  Patient presents with   Congestive Heart Failure   Shortness of Breath   Coronary Artery Disease   Follow-up    History of Present Illness:    Steven Mckee is a 70 y.o. 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    No chest pain or dyspnea.  Gets greater than 150 minutes of moderate activity per week.  Denies the need for angina.  No orthopnea.  Has noted right greater than left lower extremity edema.  Does not completely resolve by the next morning.  No palpitations, transient neurological symptoms, or claudication.  Past Medical History:  Diagnosis Date   Angio-edema    CAD (coronary artery disease)    Coronary artery disease    Diabetes mellitus without complication (HCC)    type 2   HTN (hypertension) 10/28/2012   Hypercholesteremia    Hyperlipidemia 03/02/2014   Low testosterone    Metabolic syndrome    Mitral valve regurgitation    Myocardial infarction ([place])    Obesity    Obesity     Past Surgical History:  Procedure Laterality Date   COLONOSCOPY     SKIN SURGERY     Mohs   VENTRAL HERNIA REPAIR N/A 01/11/2020   Procedure: OPEN REPAIR VENTRAL HERNIA WITH MESH PATCH;  Surgeon: Armandina Gemma, MD;  Location: [facility];  Service: General;  Laterality: N/A;  LMA    Current Medications: Current Meds  Medication Sig   aspirin EC 81 MG tablet Take 1 tablet (81 mg total) by mouth daily.   azelastine (ASTELIN) 0.1 % nasal spray Place 1-2 sprays into both nostrils 2 (two) times daily as needed. Use in each nostril as directed   diphenhydrAMINE  (BENADRYL) 25 MG tablet Take 25 mg by mouth every 6 (six) hours as needed for allergies.   fluticasone (FLONASE) 50 MCG/ACT nasal spray Place 1 spray into both nostrils 2 (two) times daily as needed.   halobetasol (ULTRAVATE) 0.05 % ointment Apply 1 application topically daily as needed. (contact dermatitis)   ibuprofen (ADVIL) 200 MG tablet Take 200-400 mg by mouth daily as needed for headache or moderate pain.   lisinopril-hydrochlorothiazide (ZESTORETIC) 20-12.5 MG tablet Take 1 tablet by mouth daily. Pt needs to keep upcoming appt in June for further refills   metFORMIN (GLUCOPHAGE) 500 MG tablet Take 500 mg by mouth daily with breakfast.   rosuvastatin (CRESTOR) 10 MG tablet Take 1 tablet (10 mg total) by mouth daily.   traMADol (ULTRAM) 50 MG tablet Take 1-2 tablets (50-100 mg total) by mouth every 6 (six) hours as needed.   [DISCONTINUED] rosuvastatin (CRESTOR) 5 MG tablet Take 1 tablet (5 mg total) by mouth daily. Pt needs to keep upcoming appt in June for further refills     Allergies:   Patient has no known allergies.   Social History   Socioeconomic History   Marital status: Married    Spouse name: Not on file   Number of children: Not on file   Years of education: Not on file   Highest education level: Not on file  Occupational History   Not on file  Tobacco Use   Smoking status: Former    Pack years: 0.00    Types: Cigarettes    Quit date: 03/05/2009    Years since quitting: 11.7   Smokeless tobacco: Never  Vaping Use   Vaping Use: Never used  Substance and Sexual Activity   Alcohol use: Not Currently    Alcohol/week: 0.0 standard drinks    Comment: Occasional   Drug use: Never   Sexual activity: Never  Other Topics Concern   Not on file  Social History Narrative   Not on file   Social Determinants of Health   Financial Resource Strain: Not on file  Food Insecurity: Not on file  Transportation Needs: Not on file  Physical Activity: Not on file  Stress: Not  on file  Social Connections: Not on file     Family History: The patient's family history includes Allergic rhinitis in his mother and sister; Dementia in his mother; Heart disease in his father; Stroke in his father. There is no history of Asthma, Eczema, or Urticaria.  ROS:   Please see the history of present illness.    Tingling and burning in his toes.  Snores loudly.  Has to take a nap each afternoon, and has some difficulty with balance.  All other systems reviewed and are negative.  EKGs/Labs/Other Studies Reviewed:    The following studies were reviewed today: ECHO 11/2020: IMPRESSIONS     1. Left ventricular ejection fraction, by estimation, is 60 to 65%. The  left ventricle has normal function. The left ventricle has no regional  wall motion abnormalities. There is mild left ventricular hypertrophy.  Left ventricular diastolic parameters  are consistent with Grade I diastolic dysfunction (impaired relaxation).   2. Right ventricular systolic function is normal. The right ventricular  size is normal. Tricuspid regurgitation signal is inadequate for assessing  PA pressure.   3. The mitral valve is abnormal. 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   4. The aortic valve is tricuspid. There is mild calcification of the  aortic valve. Aortic valve regurgitation is not visualized. Mild aortic  valve sclerosis is present, with no evidence of aortic valve stenosis.   5. The inferior vena cava is normal in size with greater than 50%  respiratory variability, suggesting right atrial pressure of 3 mmHg.   Comparison(s): Compared to prior echo in 2015, there again is prolapse of  the posterior mitral valve leaflet with at least mild anteriorly directed  MR.   EKG:  EKG normal sinus rhythm, old inferior infarct, first-degree AV block 352 ms, and when compared to  September 25, 2019, no significant changes noted.  Recent Labs: 01/06/2020: BUN 30; Creatinine, Ser 0.99; Hemoglobin 13.3; Platelets 151; Potassium 4.3; Sodium 139  Recent Lipid Panel    Component Value Date/Time   CHOL 149 02/26/2014 0756   CHOL 126 11/13/2012 0841   TRIG 105.0 02/26/2014 0756   TRIG 68 06/15/2013 0856   TRIG 112 11/13/2012 0841   HDL 43.40 02/26/2014 0756   HDL 61 06/15/2013 0856   HDL 46 11/13/2012 0841   CHOLHDL 3 02/26/2014 0756   VLDL 21.0 02/26/2014 0756   LDLCALC 85 02/26/2014 0756   LDLCALC 78 06/15/2013 0856   LDLCALC 58 11/13/2012 0841    Physical Exam:    VS:  BP 120/60 (BP Location: Left Arm, Patient  Position: Sitting, Cuff Size: Normal)   Pulse 76   Ht 5' 10.5" (1.791 m)   Wt 263 lb (119.3 kg)   SpO2 95%   BMI 37.20 kg/m     Wt Readings from Last 3 Encounters:  12/07/20 263 lb (119.3 kg)  10/20/20 256 lb (116.1 kg)  01/11/20 (!) 255 lb 4 oz (115.8 kg)     GEN: Obese. No acute distress HEENT: Normal NECK: No JVD. LYMPHATICS: No lymphadenopathy CARDIAC: S4 without S3.  2/6 to 3/6 holosystolic mitral regurgitation murmur. RRR, with right greater than left pitting shin and dorsal edema. VASCULAR:  Normal Pulses. No bruits. RESPIRATORY:  Clear to auscultation without rales, wheezing or rhonchi  ABDOMEN: Soft, non-tender, non-distended, No pulsatile mass, MUSCULOSKELETAL: No deformity  SKIN: Warm and dry NEUROLOGIC:  Alert and oriented x 3 PSYCHIATRIC:  Normal affect   ASSESSMENT:    1. Non-rheumatic mitral regurgitation   2. Coronary artery disease involving native coronary artery of native heart with angina pectoris ([place])   3. Mixed hyperlipidemia   4. First degree AV block   5. Numbness in feet   6. Type 2 diabetes mellitus with complication, with long-term current use of insulin ([place])   7. Daytime sleepiness   8. Nocturia   9. Snoring    PLAN:    In order of problems listed above:  Echo suggest stability of nonrheumatic mitral  regurgitation.  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 Secondary prevention reviewed as outlined below. LDL when last evaluated in January was 90.  We Syaire increase Crestor to 10 mg/day to achieve an LDL less than 70 and preferably closer to 55-60. Unchanged.  Care not to use beta-blocker therapy. Possible peripheral neuropathy.  We discussed diabetes.  Needs to mention this to his primary care physician and consider work-up. Consider adding SGLT2 therapy Sleep study [place] be performed.  Overall education and awareness concerning secondary risk prevention was discussed in detail: LDL less than 70, hemoglobin A1c less than 7, blood pressure target less than 130/80 mmHg, >150 minutes of moderate aerobic activity per week, avoidance of smoking, weight control (via diet and exercise), and continued surveillance/management of/for obstructive sleep apnea.    Medication Adjustments/Labs and Tests Ordered: Current medicines are reviewed at length with the patient today.  Concerns regarding medicines are outlined above.  Orders Placed This Encounter  Procedures   Lipid panel   Hepatic function panel   EKG 12-Lead   Split night study   Meds ordered this encounter  Medications   rosuvastatin (CRESTOR) 10 MG tablet    Sig: Take 1 tablet (10 mg total) by mouth daily.    Dispense:  90 tablet    Refill:  3    Patient Instructions  Medication Instructions:  1) INCREASE Rosuvastatin to 10mg  once daily  *If you need a refill on your cardiac medications before your next appointment, please call your pharmacy*   Lab Work: Lipid and Liver in 6-8 weeks.  You Wagner need to be fasting for these labs (nothing to eat or drink after midnight except water and black coffee).  If you have labs (blood work) drawn today and your tests are completely normal, you Mohammad receive your results only by: [place] (if you have MyChart) OR A paper copy in the  mail If you have any lab test that is abnormal or we need to change your treatment, we Kaelin call you to review the results.   Testing/Procedures: Your  physician has recommended that you have a sleep study. This test records several body functions during sleep, including: brain activity, eye movement, oxygen and carbon dioxide blood levels, heart rate and rhythm, breathing rate and rhythm, the flow of air through your mouth and nose, snoring, body muscle movements, and chest and belly movement.   Follow-Up: At [facility], you and your health needs are our priority.  As part of our continuing mission to provide you with exceptional heart care, we have created designated Provider Care Teams.  These Care Teams include your primary Cardiologist (physician) and Advanced Practice Providers (APPs -  Physician Assistants and Nurse Practitioners) who all work together to provide you with the care you need, when you need it.  We recommend signing up for the patient portal called "MyChart".  Sign up information is provided on this After Visit Summary.  MyChart is used to connect with patients for Virtual Visits (Telemedicine).  Patients are able to view lab/test results, encounter notes, upcoming appointments, etc.  Non-urgent messages can be sent to your provider as well.   To learn more about what you can do with MyChart, go to [email or web address].    Your next appointment:   1 year(s)  The format for your next appointment:   In Person  Provider:   You may see Steven Grooms, MD or one of the following Advanced Practice Providers on your designated Care Team:   Kathyrn Drown, NP   Other Instructions     Signed, Steven Grooms, MD  12/07/2020 8:49 AM    [place]

## 2020-12-07 ENCOUNTER — Ambulatory Visit: Payer: Medicare Other | Admitting: Interventional Cardiology

## 2020-12-07 ENCOUNTER — Encounter: Payer: Self-pay | Admitting: Interventional Cardiology

## 2020-12-07 VITALS — BP 120/60 | HR 76 | Ht 70.5 in | Wt 263.0 lb

## 2020-12-07 DIAGNOSIS — I25119 Atherosclerotic heart disease of native coronary artery with unspecified angina pectoris: Secondary | ICD-10-CM

## 2020-12-07 DIAGNOSIS — E118 Type 2 diabetes mellitus with unspecified complications: Secondary | ICD-10-CM

## 2020-12-07 DIAGNOSIS — R400 Somnolence: Secondary | ICD-10-CM | POA: Diagnosis not present

## 2020-12-07 DIAGNOSIS — R200 Anesthesia of skin: Secondary | ICD-10-CM | POA: Diagnosis not present

## 2020-12-07 DIAGNOSIS — I340 Nonrheumatic mitral (valve) insufficiency: Secondary | ICD-10-CM

## 2020-12-07 DIAGNOSIS — E782 Mixed hyperlipidemia: Secondary | ICD-10-CM | POA: Diagnosis not present

## 2020-12-07 DIAGNOSIS — R0683 Snoring: Secondary | ICD-10-CM | POA: Diagnosis not present

## 2020-12-07 DIAGNOSIS — I440 Atrioventricular block, first degree: Secondary | ICD-10-CM

## 2020-12-07 DIAGNOSIS — R351 Nocturia: Secondary | ICD-10-CM

## 2020-12-07 DIAGNOSIS — Z794 Long term (current) use of insulin: Secondary | ICD-10-CM

## 2020-12-07 MED ORDER — ROSUVASTATIN CALCIUM 10 MG PO TABS
10.0000 mg | ORAL_TABLET | Freq: Every day | ORAL | 3 refills | Status: DC
Start: 2020-12-07 — End: 2022-01-01

## 2020-12-07 NOTE — Patient Instructions (Signed)
Medication Instructions:  1) INCREASE Rosuvastatin to 10mg  once daily  *If you need a refill on your cardiac medications before your next appointment, please call your pharmacy*   Lab Work: Lipid and Liver in 6-8 weeks.  You Oshua need to be fasting for these labs (nothing to eat or drink after midnight except water and black coffee).  If you have labs (blood work) drawn today and your tests are completely normal, you Morse receive your results only by: [place] (if you have MyChart) OR A paper copy in the mail If you have any lab test that is abnormal or we need to change your treatment, we Emori call you to review the results.   Testing/Procedures: Your physician has recommended that you have a sleep study. This test records several body functions during sleep, including: brain activity, eye movement, oxygen and carbon dioxide blood levels, heart rate and rhythm, breathing rate and rhythm, the flow of air through your mouth and nose, snoring, body muscle movements, and chest and belly movement.   Follow-Up: At [facility], you and your health needs are our priority.  As part of our continuing mission to provide you with exceptional heart care, we have created designated Provider Care Teams.  These Care Teams include your primary Cardiologist (physician) and Advanced Practice Providers (APPs -  Physician Assistants and Nurse Practitioners) who all work together to provide you with the care you need, when you need it.  We recommend signing up for the patient portal called "MyChart".  Sign up information is provided on this After Visit Summary.  MyChart is used to connect with patients for Virtual Visits (Telemedicine).  Patients are able to view lab/test results, encounter notes, upcoming appointments, etc.  Non-urgent messages can be sent to your provider as well.   To learn more about what you can do with MyChart, go to [email or web address].    Your next appointment:   1  year(s)  The format for your next appointment:   In Person  Provider:   You may see Sinclair Grooms, MD or one of the following Advanced Practice Providers on your designated Care Team:   Kathyrn Drown, NP   Other Instructions

## 2020-12-18 ENCOUNTER — Other Ambulatory Visit: Payer: Self-pay | Admitting: Interventional Cardiology

## 2020-12-20 DIAGNOSIS — I10 Essential (primary) hypertension: Secondary | ICD-10-CM | POA: Diagnosis not present

## 2020-12-20 DIAGNOSIS — E1159 Type 2 diabetes mellitus with other circulatory complications: Secondary | ICD-10-CM | POA: Diagnosis not present

## 2020-12-20 DIAGNOSIS — I2510 Atherosclerotic heart disease of native coronary artery without angina pectoris: Secondary | ICD-10-CM | POA: Diagnosis not present

## 2020-12-20 DIAGNOSIS — M25551 Pain in right hip: Secondary | ICD-10-CM | POA: Diagnosis not present

## 2020-12-20 DIAGNOSIS — E7800 Pure hypercholesterolemia, unspecified: Secondary | ICD-10-CM | POA: Diagnosis not present

## 2020-12-20 DIAGNOSIS — M25552 Pain in left hip: Secondary | ICD-10-CM | POA: Diagnosis not present

## 2020-12-23 ENCOUNTER — Telehealth: Payer: Self-pay | Admitting: *Deleted

## 2020-12-23 DIAGNOSIS — I10 Essential (primary) hypertension: Secondary | ICD-10-CM | POA: Diagnosis not present

## 2020-12-23 DIAGNOSIS — E7800 Pure hypercholesterolemia, unspecified: Secondary | ICD-10-CM | POA: Diagnosis not present

## 2020-12-23 DIAGNOSIS — E1159 Type 2 diabetes mellitus with other circulatory complications: Secondary | ICD-10-CM | POA: Diagnosis not present

## 2020-12-23 DIAGNOSIS — M25552 Pain in left hip: Secondary | ICD-10-CM | POA: Diagnosis not present

## 2020-12-23 DIAGNOSIS — M25551 Pain in right hip: Secondary | ICD-10-CM | POA: Diagnosis not present

## 2020-12-23 NOTE — Telephone Encounter (Signed)
-----   Message from Loren Racer, RN sent at 12/07/2020  8:50 AM EDT ----- Sleep study ordered

## 2020-12-23 NOTE — Telephone Encounter (Signed)
Prior Authorization for Split night sent to [facility] via web portal.  Notification or Prior Authorization is not required for the requested services  Decision ID #:[id number]

## 2021-01-03 NOTE — Telephone Encounter (Signed)
Patient is scheduled for lab study on 03/09/21. Patient understands his sleep study Connelly be done at [facility] sleep lab. Patient understands he Eura receive a sleep packet in a week or so. Patient understands to call if he does not receive the sleep packet in a timely manner.  Left detailed message on voicemail with date and time of titration and informed patient to call back to confirm or reschedule.

## 2021-01-27 DIAGNOSIS — M25512 Pain in left shoulder: Secondary | ICD-10-CM | POA: Diagnosis not present

## 2021-01-27 DIAGNOSIS — M25551 Pain in right hip: Secondary | ICD-10-CM | POA: Diagnosis not present

## 2021-01-27 DIAGNOSIS — M25511 Pain in right shoulder: Secondary | ICD-10-CM | POA: Diagnosis not present

## 2021-01-27 DIAGNOSIS — M25552 Pain in left hip: Secondary | ICD-10-CM | POA: Diagnosis not present

## 2021-02-01 ENCOUNTER — Other Ambulatory Visit: Payer: Self-pay

## 2021-02-01 ENCOUNTER — Other Ambulatory Visit: Payer: Medicare Other | Admitting: *Deleted

## 2021-02-01 DIAGNOSIS — I25119 Atherosclerotic heart disease of native coronary artery with unspecified angina pectoris: Secondary | ICD-10-CM

## 2021-02-01 DIAGNOSIS — E782 Mixed hyperlipidemia: Secondary | ICD-10-CM

## 2021-02-01 LAB — HEPATIC FUNCTION PANEL
ALT: 35 IU/L (ref 0–44)
AST: 40 IU/L (ref 0–40)
Albumin: 4.6 g/dL (ref 3.8–4.8)
Alkaline Phosphatase: 61 IU/L (ref 44–121)
Bilirubin Total: 0.4 mg/dL (ref 0.0–1.2)
Bilirubin, Direct: 0.16 mg/dL (ref 0.00–0.40)
Total Protein: 6.3 g/dL (ref 6.0–8.5)

## 2021-02-01 LAB — LIPID PANEL
Chol/HDL Ratio: 2.4 ratio (ref 0.0–5.0)
Cholesterol, Total: 128 mg/dL (ref 100–199)
HDL: 53 mg/dL (ref >39)
LDL Chol Calc (NIH): 62 mg/dL (ref 0–99)
Triglycerides: 62 mg/dL (ref 0–149)
VLDL Cholesterol Cal: 13 mg/dL (ref 5–40)

## 2021-02-06 DIAGNOSIS — H2513 Age-related nuclear cataract, bilateral: Secondary | ICD-10-CM | POA: Diagnosis not present

## 2021-02-06 DIAGNOSIS — H40013 Open angle with borderline findings, low risk, bilateral: Secondary | ICD-10-CM | POA: Diagnosis not present

## 2021-02-06 DIAGNOSIS — H02831 Dermatochalasis of right upper eyelid: Secondary | ICD-10-CM | POA: Diagnosis not present

## 2021-02-06 DIAGNOSIS — H02834 Dermatochalasis of left upper eyelid: Secondary | ICD-10-CM | POA: Diagnosis not present

## 2021-03-09 ENCOUNTER — Ambulatory Visit (HOSPITAL_BASED_OUTPATIENT_CLINIC_OR_DEPARTMENT_OTHER): Payer: Medicare Other | Attending: Interventional Cardiology | Admitting: Cardiology

## 2021-03-09 ENCOUNTER — Other Ambulatory Visit: Payer: Self-pay

## 2021-03-09 DIAGNOSIS — G4733 Obstructive sleep apnea (adult) (pediatric): Secondary | ICD-10-CM | POA: Insufficient documentation

## 2021-03-09 DIAGNOSIS — R351 Nocturia: Secondary | ICD-10-CM | POA: Insufficient documentation

## 2021-03-09 DIAGNOSIS — R0683 Snoring: Secondary | ICD-10-CM | POA: Insufficient documentation

## 2021-03-09 DIAGNOSIS — R400 Somnolence: Secondary | ICD-10-CM | POA: Diagnosis not present

## 2021-03-09 DIAGNOSIS — I493 Ventricular premature depolarization: Secondary | ICD-10-CM | POA: Diagnosis not present

## 2021-03-12 NOTE — Procedures (Signed)
Patient Name: Steven Mckee, Steven Mckee Date: 03/09/2021 Gender: Male D.O.B: 06/05/1951 Age (years): 69 Referring Provider: Verdis Prime Height (inches): 70 Interpreting Physician: Armanda Magic MD, ABSM Weight (lbs): 255 RPSGT: Shelah Lewandowsky BMI: 37 MRN: [id number] Neck Size: 19.00  CLINICAL INFORMATION Sleep Study Type: Split Night CPAP  Indication for sleep study: Diabetes, Hypertension, Obesity, Snoring  Epworth Sleepiness Score: 4  SLEEP STUDY TECHNIQUE As per the AASM Manual for the Scoring of Sleep and Associated Events v2.3 (April 2016) with a hypopnea requiring 4% desaturations.  The channels recorded and monitored were frontal, central and occipital EEG, electrooculogram (EOG), submentalis EMG (chin), nasal and oral airflow, thoracic and abdominal wall motion, anterior tibialis EMG, snore microphone, electrocardiogram, and pulse oximetry. Continuous positive airway pressure (CPAP) was initiated when the patient met split night criteria and was titrated according to treat sleep-disordered breathing.  MEDICATIONS Medications self-administered by patient taken the night of the study : METFORMIN, LISINOPRIL/HCTZ, Rosuvastatin, DIPHENHYDRAMINE, BABY ASPIRIN  RESPIRATORY PARAMETERS Diagnostic Total AHI (/hr): 23.6  RDI (/hr):28.0  OA Index (/hr): 2.9  CA Index (/hr): 0.0 REM AHI (/hr): N/A  NREM AHI (/hr):23.6  Supine AHI (/hr):65.6  Non-supine AHI (/hr):1.5 Min O2 Sat (%):87.0  Mean O2 (%): 94.1  Time below 88% (min):0.1   Titration Optimal Pressure (cm):14  AHI at Optimal Pressure (/hr):1  Min O2 at Optimal Pressure (%):92.0 Supine % at Optimal (%):54  Sleep % at Optimal (%):99   SLEEP ARCHITECTURE The recording time for the entire night was 384.3 minutes.  During a baseline period of 145.6 minutes, the patient slept for 124.5 minutes in REM and nonREM, yielding a sleep efficiency of 85.5%. Sleep onset after lights out was 8.2 minutes with a REM latency of  N/A minutes. The patient spent 16.9% of the night in stage N1 sleep, 83.1% in stage N2 sleep, 0.0% in stage N3 and 0% in REM.  During the titration period of 230.6 minutes, the patient slept for 208.5 minutes in REM and nonREM, yielding a sleep efficiency of 90.4%. Sleep onset after CPAP initiation was 12.9 minutes with a REM latency of 78.5 minutes. The patient spent 5.3% of the night in stage N1 sleep, 65.0% in stage N2 sleep, 0.0% in stage N3 and 29.7% in REM.  CARDIAC DATA The 2 lead EKG demonstrated sinus rhythm. The mean heart rate was 100.0 beats per minute. Other EKG findings include: PVCs.  LEG MOVEMENT DATA The total Periodic Limb Movements of Sleep (PLMS) were 0. The PLMS index was 0.0 .  IMPRESSIONS - Moderate obstructive sleep apnea occurred during the diagnostic portion of the study(AHI = 23.6/hour). An optimal PAP pressure was selected for this patient ( 14 cm of water) - No significant central sleep apnea occurred during the diagnostic portion of the study (CAI = 0.0/hour). - Moderate oxygen desaturation was noted during the diagnostic portion of the study (Min O2 =87.0%). - The patient snored with moderate snoring volume during the diagnostic portion of the study. - EKG findings include PVCs. - Clinically significant periodic limb movements did not occur during sleep.  DIAGNOSIS - Obstructive Sleep Apnea (G47.33)  RECOMMENDATIONS - Trial of CPAP therapy on 14 cm H2O with a Medium size Fisher&Paykel Full Face Mask F&P Vitera (new) mask and heated humidification. - Avoid alcohol, sedatives and other CNS depressants that may worsen sleep apnea and disrupt normal sleep architecture. - Sleep hygiene should be reviewed to assess factors that may improve sleep quality. - Weight management and regular exercise should be  initiated or continued. - Return to Sleep Center for re-evaluation after 4 weeks of therapy  [Electronically signed] 03/12/2021 03:25 PM  Fransico Him MD,  ABSM Diplomate, American Board of Sleep Medicine

## 2021-03-14 ENCOUNTER — Telehealth: Payer: Self-pay | Admitting: *Deleted

## 2021-03-14 NOTE — Telephone Encounter (Addendum)
The patient has been notified of the result and verbalized understanding.  Left detailed message on voicemail and informed patient to call back with questions Marolyn Hammock, [place] 03/14/2021 2:50 PM     DME selection is [facility]. Patient understands he Verlie be contacted by [facility] to set up his cpap. Patient understands to call if [facility] does not contact him with new setup in a timely manner. Patient understands they [place] be called once confirmation has been received from [facility] that they have received their new machine to schedule 10 week follow up appointment.   [facility] notified of new cpap order  Please add to airview Patient was grateful for the call and thanked me

## 2021-03-14 NOTE — Telephone Encounter (Signed)
-----   Message from Sueanne Margarita, MD sent at 03/12/2021  3:27 PM EDT ----- Please let patient know that they have sleep apnea with successful PAP titration.  PAP ordered placed in Epic.  Followup in 6 weeks after starting PAP therapy

## 2021-04-11 DIAGNOSIS — L570 Actinic keratosis: Secondary | ICD-10-CM | POA: Diagnosis not present

## 2021-04-11 DIAGNOSIS — L814 Other melanin hyperpigmentation: Secondary | ICD-10-CM | POA: Diagnosis not present

## 2021-04-11 DIAGNOSIS — Z85828 Personal history of other malignant neoplasm of skin: Secondary | ICD-10-CM | POA: Diagnosis not present

## 2021-04-11 DIAGNOSIS — L821 Other seborrheic keratosis: Secondary | ICD-10-CM | POA: Diagnosis not present

## 2021-04-11 DIAGNOSIS — D225 Melanocytic nevi of trunk: Secondary | ICD-10-CM | POA: Diagnosis not present

## 2021-04-11 DIAGNOSIS — L578 Other skin changes due to chronic exposure to nonionizing radiation: Secondary | ICD-10-CM | POA: Diagnosis not present

## 2021-04-11 DIAGNOSIS — Z86018 Personal history of other benign neoplasm: Secondary | ICD-10-CM | POA: Diagnosis not present

## 2021-04-11 DIAGNOSIS — L400 Psoriasis vulgaris: Secondary | ICD-10-CM | POA: Diagnosis not present

## 2021-04-24 NOTE — Telephone Encounter (Signed)
Return Call: The patient has been notified of the result and verbalized understanding. Patient request a video visit to discuss results and options. Visit scheduled.

## 2021-05-24 DIAGNOSIS — Z0000 Encounter for general adult medical examination without abnormal findings: Secondary | ICD-10-CM | POA: Diagnosis not present

## 2021-05-24 DIAGNOSIS — Z23 Encounter for immunization: Secondary | ICD-10-CM | POA: Diagnosis not present

## 2021-05-25 ENCOUNTER — Telehealth: Payer: Medicare Other | Admitting: Cardiology

## 2021-05-25 DIAGNOSIS — E119 Type 2 diabetes mellitus without complications: Secondary | ICD-10-CM | POA: Diagnosis not present

## 2021-05-25 DIAGNOSIS — E7800 Pure hypercholesterolemia, unspecified: Secondary | ICD-10-CM | POA: Diagnosis not present

## 2021-06-15 DIAGNOSIS — H02834 Dermatochalasis of left upper eyelid: Secondary | ICD-10-CM | POA: Diagnosis not present

## 2021-06-15 DIAGNOSIS — H02831 Dermatochalasis of right upper eyelid: Secondary | ICD-10-CM | POA: Diagnosis not present

## 2021-06-15 DIAGNOSIS — H40013 Open angle with borderline findings, low risk, bilateral: Secondary | ICD-10-CM | POA: Diagnosis not present

## 2021-06-15 DIAGNOSIS — H2512 Age-related nuclear cataract, left eye: Secondary | ICD-10-CM | POA: Diagnosis not present

## 2021-06-29 DIAGNOSIS — E1159 Type 2 diabetes mellitus with other circulatory complications: Secondary | ICD-10-CM | POA: Diagnosis not present

## 2021-06-29 DIAGNOSIS — L659 Nonscarring hair loss, unspecified: Secondary | ICD-10-CM | POA: Diagnosis not present

## 2021-06-29 DIAGNOSIS — H269 Unspecified cataract: Secondary | ICD-10-CM | POA: Diagnosis not present

## 2021-06-29 DIAGNOSIS — M722 Plantar fascial fibromatosis: Secondary | ICD-10-CM | POA: Diagnosis not present

## 2021-06-29 DIAGNOSIS — I10 Essential (primary) hypertension: Secondary | ICD-10-CM | POA: Diagnosis not present

## 2021-06-29 DIAGNOSIS — Z23 Encounter for immunization: Secondary | ICD-10-CM | POA: Diagnosis not present

## 2021-06-29 DIAGNOSIS — E7800 Pure hypercholesterolemia, unspecified: Secondary | ICD-10-CM | POA: Diagnosis not present

## 2021-06-29 DIAGNOSIS — L409 Psoriasis, unspecified: Secondary | ICD-10-CM | POA: Diagnosis not present

## 2021-06-29 DIAGNOSIS — I2510 Atherosclerotic heart disease of native coronary artery without angina pectoris: Secondary | ICD-10-CM | POA: Diagnosis not present

## 2021-07-03 DIAGNOSIS — L400 Psoriasis vulgaris: Secondary | ICD-10-CM | POA: Diagnosis not present

## 2021-07-15 ENCOUNTER — Encounter: Payer: Self-pay | Admitting: Interventional Cardiology

## 2021-07-18 ENCOUNTER — Ambulatory Visit (INDEPENDENT_AMBULATORY_CARE_PROVIDER_SITE_OTHER)
Admission: RE | Admit: 2021-07-18 | Discharge: 2021-07-18 | Disposition: A | Discharge status: Home / Self Care | Payer: Medicare Other | Source: Ambulatory Visit | Attending: Acute Care | Admitting: Acute Care

## 2021-07-18 ENCOUNTER — Other Ambulatory Visit: Payer: Self-pay

## 2021-07-18 DIAGNOSIS — Z87891 Personal history of nicotine dependence: Secondary | ICD-10-CM | POA: Diagnosis not present

## 2021-07-18 IMAGING — CT CT CHEST LUNG CANCER SCREENING LOW DOSE W/O CM
2 of 5 series · 14 of 40 positions shown, 17 images · non-contrast
Comparison: Low-dose lung cancer screening chest CT 07/18/2020.

CLINICAL DATA: 70-year-old male former smoker (quit in 2545) with
45 pack-year history of smoking. Lung cancer screening examination.



[Series 3: lung thins 1.0 · axial · 0.82mm/px · z∈[-312,-32]mm · 11 of 308 slices shown, 14 images]
[im 14/308  mediastinal]
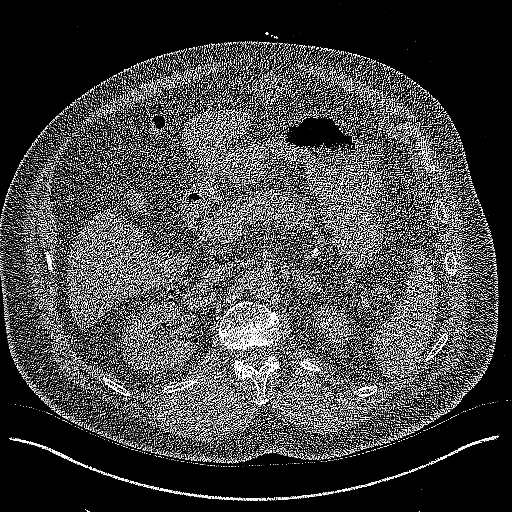
[im 14/308  lung]
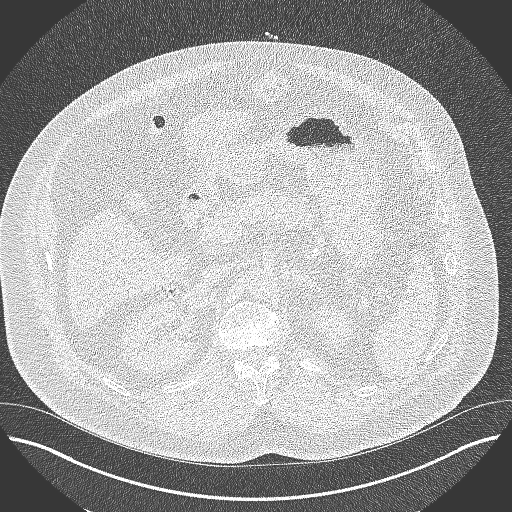
[im 42/308  lung]
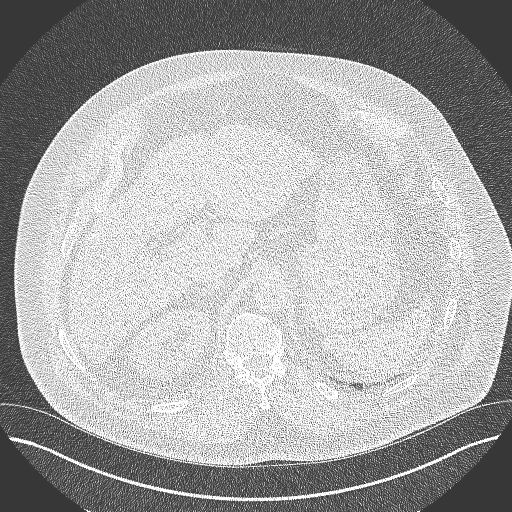
[im 70/308  lung]
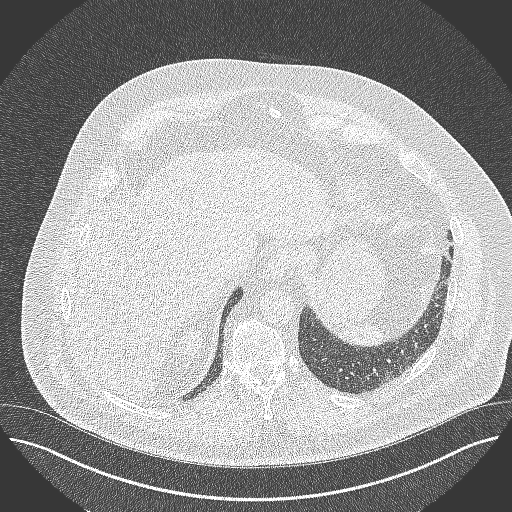
[im 98/308  lung]
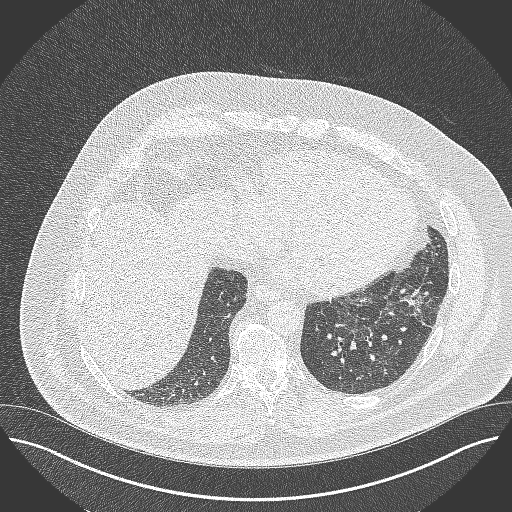
[im 126/308  mediastinal]
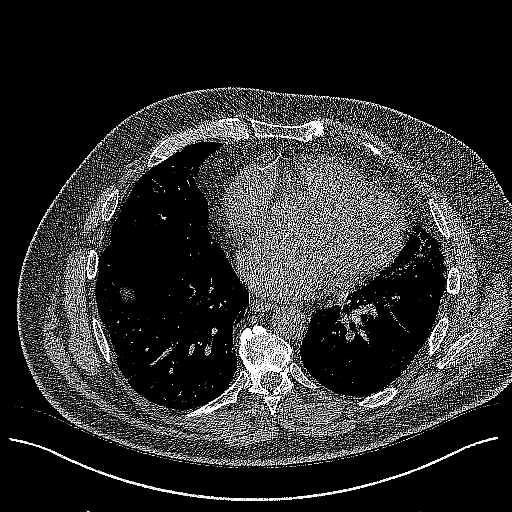
[im 126/308  lung]
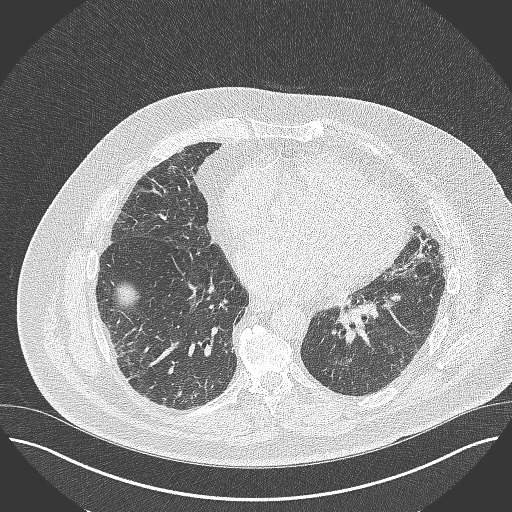
[im 154/308  lung]
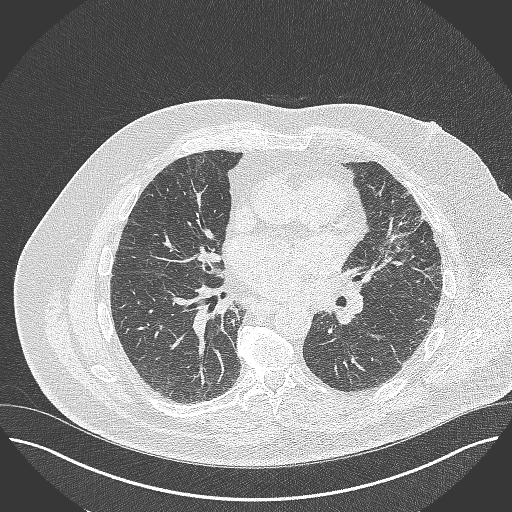
[im 182/308  lung]
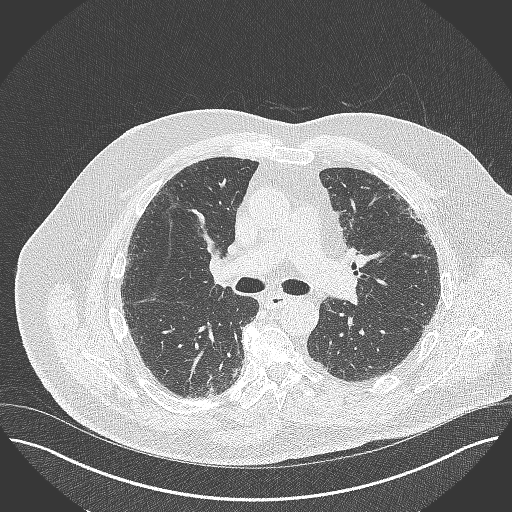
[im 210/308  lung]
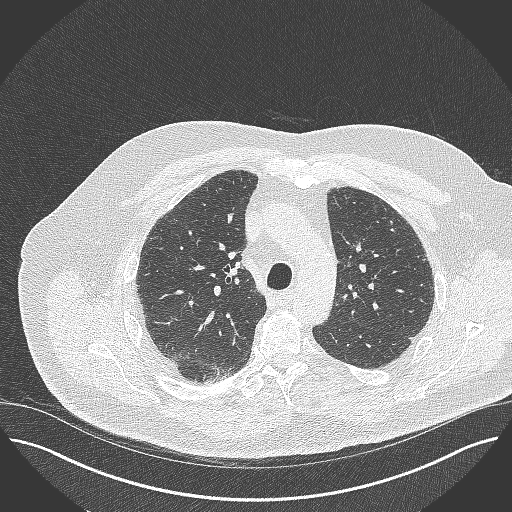
[im 238/308  mediastinal]
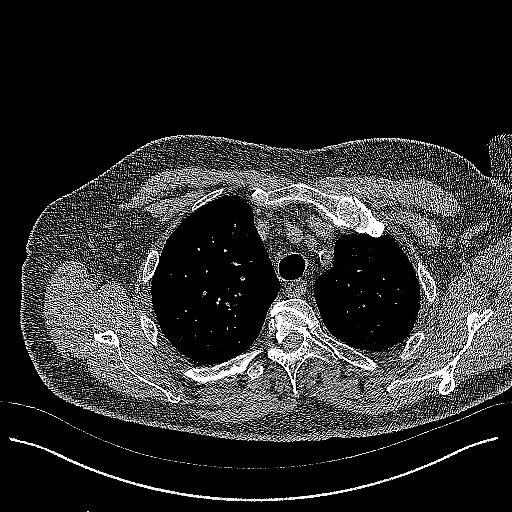
[im 238/308  lung]
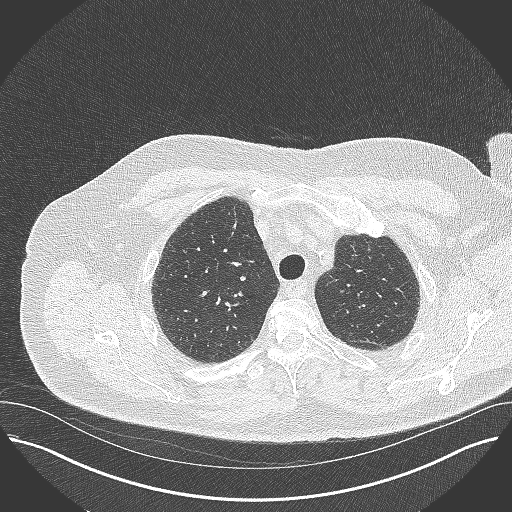
[im 266/308  lung]
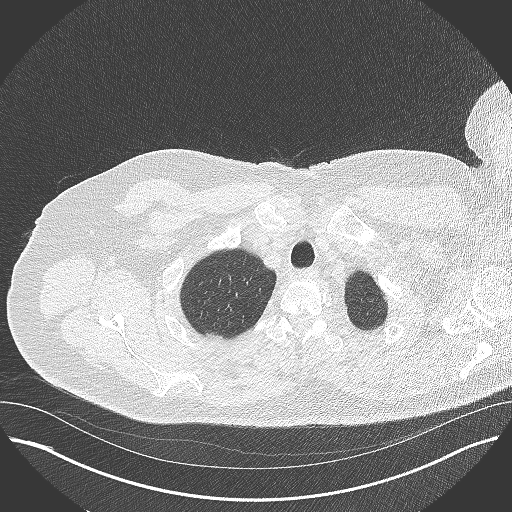
[im 294/308  lung]
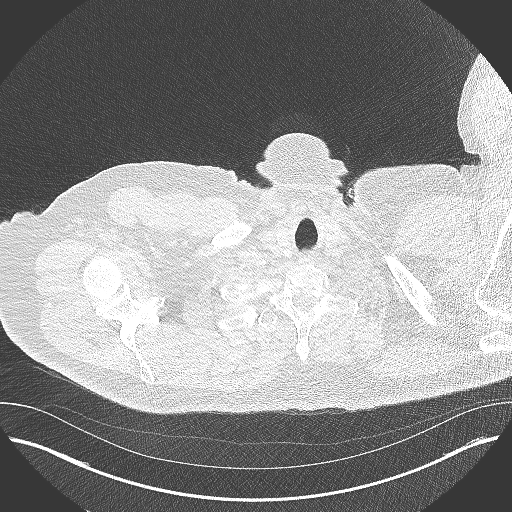

[Series 5: coronal · coronal · 0.60mm/px · 3 of 133 slices shown]
[im 27/133  lung]
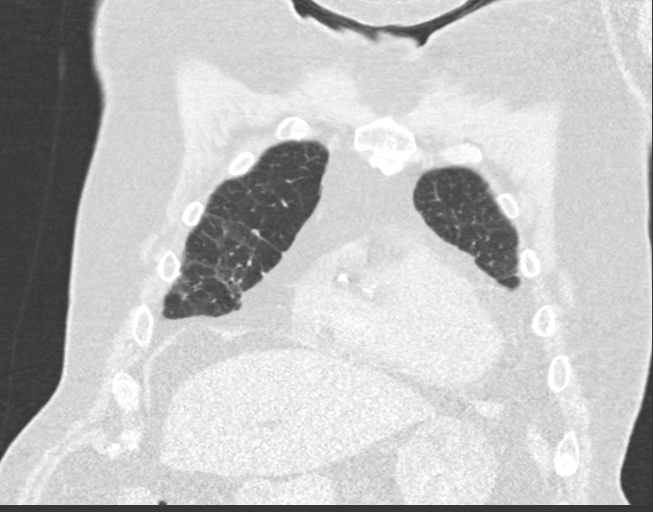
[im 53/133  lung]
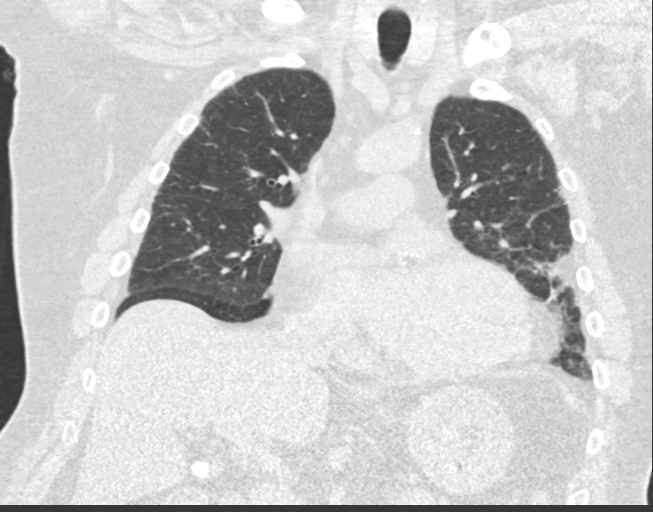
[im 80/133  lung]
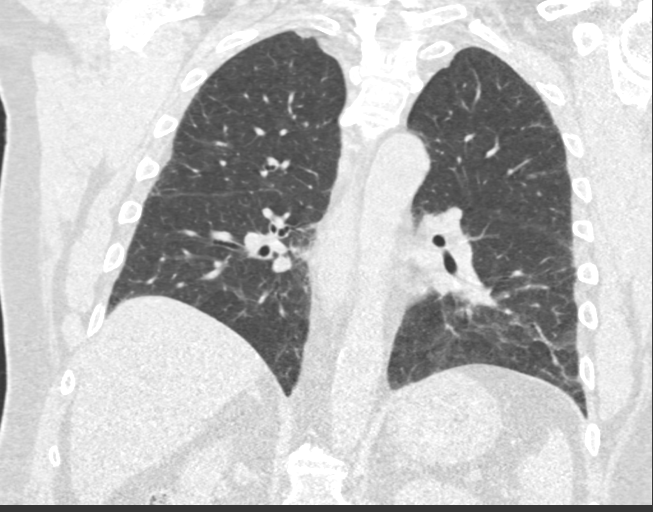

[14 of 40 positions shown; findings below may reference images not displayed]

FINDINGS: Cardiovascular: Heart size is normal. There is no significant
pericardial fluid, thickening or pericardial calcification. There is
aortic atherosclerosis, as well as atherosclerosis of the great
vessels of the mediastinum and the coronary arteries, including
calcified atherosclerotic plaque in the left main, left anterior
descending, left circumflex and right coronary arteries.

Mediastinum/Nodes: No pathologically enlarged mediastinal or hilar
lymph nodes. Esophagus is unremarkable in appearance. No axillary
lymphadenopathy.

Lungs/Pleura: No suspicious appearing pulmonary nodules or masses
are noted. No acute consolidative airspace disease. No pleural
effusions. Widespread but patchy areas of ground-glass attenuation,
septal thickening, thickening of the peribronchovascular
interstitium, regional architectural distortion and mild cylindrical
bronchiectasis are noted, most evident throughout the mid to lower
lungs, similar to the prior study.

Upper Abdomen: Aortic atherosclerosis. 13 mm calcified gallstone in
the gallbladder. Diffuse low attenuation throughout the visualized
hepatic parenchyma, indicative of a background of severe hepatic
steatosis.

Musculoskeletal: There are no aggressive appearing lytic or blastic
lesions noted in the visualized portions of the skeleton.
IMPRESSION: 1. Lung-RADS 1S, negative. Continue annual screening with low-dose
chest CT without contrast in 12 months.
2. The "S" modifier above refers to potentially clinically
significant non lung cancer related findings. Specifically, the
appearance of the lungs suggests interstitial lung disease, with a
spectrum of findings considered probable usual interstitial
pneumonia (UIP) per current ATS guidelines. Referral to Pulmonology
for further clinical evaluation is recommended.
3. There is also mild diffuse bronchial wall thickening with mild
centrilobular and paraseptal emphysema; imaging findings suggestive
of underlying COPD.
4. Aortic atherosclerosis, in addition to left main and three-vessel
coronary artery disease. Please note that although the presence of
coronary artery calcium documents the presence of coronary artery
disease, the severity of this disease and any potential stenosis
cannot be assessed on this non-gated CT examination. Assessment for
potential risk factor modification, dietary therapy or pharmacologic
therapy may be warranted, if clinically indicated.

Aortic Atherosclerosis (K7VQW-[id number].R) and Emphysema ([id number].F).

## 2021-07-18 NOTE — Progress Notes (Signed)
Virtual Visit via Video Note   This visit type was conducted due to national recommendations for restrictions regarding the COVID-19 Pandemic (e.g. social distancing) in an effort to limit this patient's exposure and mitigate transmission in our community.  Due to his co-morbid illnesses, this patient is at least at moderate risk for complications without adequate follow up.  This format is felt to be most appropriate for this patient at this time.  All issues noted in this document were discussed and addressed.  A limited physical exam was performed with this format.  Please refer to the patient's chart for his consent to telehealth for [facility].   Date:  07/19/2021   ID:  Steven Mckee, DOB Apr 15, 1951, MRN [id number] The patient was identified using 2 identifiers.  Patient Location: Home Provider Location: Home Office   PCP:  [name], MD   [facility] Providers Cardiologist:  Sinclair Grooms, MD     Evaluation Performed:  Follow-Up Visit  Chief Complaint: OSA  History of Present Illness:    Steven Mckee is a 71 y.o. male with a history of a SCID, diabetes type 2, hypertension, hyperlipidemia and obesity.  He underwent split-night sleep study on 03/09/2021 for a history of snoring as well as hypertension and diabetes mellitus.  He tells me that he talked with Dr. Tamala Julian about getting up frequent times a night to urinate and was not sleeping well waking up 4-5 times nightly and not getting a good night of sleep but partly due to issues with body pains.  His wife keeps him awake with her breathing as well and would have to go out to sleep in the recliner.   He was found to have moderate obstructive sleep apnea with an AHI of 23.6/h and underwent CPAP titration to 14 cm H2O.  After the patient was given the results of his studies and recommendations for CPAP therapy he did not want to proceed with the therapy until he could have a visit to discuss his treatment  options.  The patient does not have symptoms concerning for COVID-19 infection (fever, chills, cough, or new shortness of breath).    Past Medical History:  Diagnosis Date   Angio-edema    CAD (coronary artery disease)    Coronary artery disease    Diabetes mellitus without complication (HCC)    type 2   HTN (hypertension) 10/28/2012   Hypercholesteremia    Hyperlipidemia 03/02/2014   Low testosterone    Metabolic syndrome    Mitral valve regurgitation    Myocardial infarction ([place])    Obesity    Obesity    Past Surgical History:  Procedure Laterality Date   COLONOSCOPY     SKIN SURGERY     Mohs   VENTRAL HERNIA REPAIR N/A 01/11/2020   Procedure: OPEN REPAIR VENTRAL HERNIA WITH MESH PATCH;  Surgeon: Armandina Gemma, MD;  Location: [facility];  Service: General;  Laterality: N/A;  LMA     Current Meds  Medication Sig   aspirin EC 81 MG tablet Take 1 tablet (81 mg total) by mouth daily.   azelastine (ASTELIN) 0.1 % nasal spray Place 1-2 sprays into both nostrils 2 (two) times daily as needed. Use in each nostril as directed   diphenhydrAMINE (BENADRYL) 25 MG tablet Take 25 mg by mouth every 6 (six) hours as needed for allergies.   fluticasone (FLONASE) 50 MCG/ACT nasal spray Place 1 spray into both nostrils 2 (two) times daily as needed.  halobetasol (ULTRAVATE) 0.05 % ointment Apply 1 application topically daily as needed. (contact dermatitis)   ibuprofen (ADVIL) 200 MG tablet Take 200-400 mg by mouth daily as needed for headache or moderate pain.   lisinopril-hydrochlorothiazide (ZESTORETIC) 20-12.5 MG tablet TAKE 1 TABLET BY MOUTH DAILY   metFORMIN (GLUCOPHAGE) 500 MG tablet Take 500 mg by mouth daily with breakfast.   rosuvastatin (CRESTOR) 10 MG tablet Take 1 tablet (10 mg total) by mouth daily.     Allergies:   Patient has no known allergies.   Social History   Tobacco Use   Smoking status: Former    Types: Cigarettes    Quit date: 03/05/2009    Years since quitting: 12.3    Smokeless tobacco: Never  Vaping Use   Vaping Use: Never used  Substance Use Topics   Alcohol use: Not Currently    Alcohol/week: 0.0 standard drinks    Comment: Occasional   Drug use: Never     Family Hx: The patient's family history includes Allergic rhinitis in his mother and sister; Dementia in his mother; Heart disease in his father; Stroke in his father. There is no history of Asthma, Eczema, or Urticaria.  ROS:   Please see the history of present illness.     All other systems reviewed and are negative.   Prior CV studies:   The following studies were reviewed today:  Split-night sleep study  Labs/Other Tests and Data Reviewed:    EKG:  No ECG reviewed.  Recent Labs: 02/01/2021: ALT 35   Recent Lipid Panel Lab Results  Component Value Date/Time   CHOL 128 02/01/2021 09:39 AM   CHOL 126 11/13/2012 08:41 AM   TRIG 62 02/01/2021 09:39 AM   TRIG 68 06/15/2013 08:56 AM   TRIG 112 11/13/2012 08:41 AM   HDL 53 02/01/2021 09:39 AM   HDL 61 06/15/2013 08:56 AM   HDL 46 11/13/2012 08:41 AM   CHOLHDL 2.4 02/01/2021 09:39 AM   CHOLHDL 3 02/26/2014 07:56 AM   LDLCALC 62 02/01/2021 09:39 AM   LDLCALC 78 06/15/2013 08:56 AM   LDLCALC 58 11/13/2012 08:41 AM    Wt Readings from Last 3 Encounters:  07/19/21 260 lb (117.9 kg)  03/09/21 255 lb (115.7 kg)  12/07/20 263 lb (119.3 kg)     Risk Assessment/Calculations:          Objective:    Vital Signs:  BP (!) 106/57    Pulse 74    Ht 5\' 10"  (1.778 m)    Wt 260 lb (117.9 kg)    BMI 37.31 kg/m    VITAL SIGNS:  reviewed GEN:  no acute distress EYES:  sclerae anicteric, EOMI - Extraocular Movements Intact RESPIRATORY:  normal respiratory effort, symmetric expansion CARDIOVASCULAR:  no peripheral edema SKIN:  no rash, lesions or ulcers. MUSCULOSKELETAL:  no obvious deformities. NEURO:  alert and oriented x 3, no obvious focal deficit PSYCH:  normal affect  ASSESSMENT & PLAN:    OSA -split-night sleep study  on 03/09/2021 for a history of snoring as well as hypertension and diabetes mellitus.  He was found to have moderate obstructive sleep apnea with an AHI of 23.6/h and underwent CPAP titration to 14 cm H2O.  -He has not started CPAP therapy yet as he wanted to discuss his options for treatment -We discussed several different options including standard therapy with CPAP, oral device and the inspire hypoglossal nerve stimulator -He is not a candidate at this time for the hypoglossal nerve stimulator  given his obesity and need for a BMI less than 32.  He also needs to failed CPAP after trying for 6 months. -I do not recommend the oral device given the severity of his apneas and it also is not covered by insurance -I have recommended that we go ahead and start CPAP therapy at 14 cm H2O and see him back in 6 weeks to see how he is doing  2.  Hypertension -BP is controlled on exam today -Continue prescription drug management with lisinopril HCT 20-12.5 mg 1 tablet daily with as needed refills   COVID-19 Education: The signs and symptoms of COVID-19 were discussed with the patient and how to seek care for testing (follow up with PCP or arrange E-visit).  The importance of social distancing was discussed today.  Time:   Today, I have spent 15 minutes with the patient with telehealth technology discussing the above problems.     Medication Adjustments/Labs and Tests Ordered: Current medicines are reviewed at length with the patient today.  Concerns regarding medicines are outlined above.   Tests Ordered: No orders of the defined types were placed in this encounter.   Medication Changes: No orders of the defined types were placed in this encounter.   Follow Up:  In Person in 6 week(s)  Signed, Fransico Him, MD  07/19/2021 9:25 AM    [facility]

## 2021-07-19 ENCOUNTER — Telehealth (INDEPENDENT_AMBULATORY_CARE_PROVIDER_SITE_OTHER): Payer: Medicare Other | Admitting: Cardiology

## 2021-07-19 ENCOUNTER — Telehealth: Payer: Self-pay | Admitting: *Deleted

## 2021-07-19 ENCOUNTER — Encounter: Payer: Self-pay | Admitting: Cardiology

## 2021-07-19 VITALS — BP 106/57 | HR 74 | Ht 70.0 in | Wt 260.0 lb

## 2021-07-19 DIAGNOSIS — I10 Essential (primary) hypertension: Secondary | ICD-10-CM

## 2021-07-19 DIAGNOSIS — G4733 Obstructive sleep apnea (adult) (pediatric): Secondary | ICD-10-CM

## 2021-07-19 NOTE — Telephone Encounter (Signed)
Order placed to [facility] via fax. 

## 2021-07-19 NOTE — Telephone Encounter (Signed)
-----   Message from Antonieta Iba, RN sent at 07/19/2021  9:26 AM EST ----- Per Dr. Radford Pax: start ResMed CPAP therapy at 14 cm H2O and see him back in 6 weeks  Thanks!

## 2021-07-28 ENCOUNTER — Encounter: Payer: Self-pay | Admitting: Interventional Cardiology

## 2021-08-08 ENCOUNTER — Ambulatory Visit: Payer: Medicare Other | Admitting: Internal Medicine

## 2021-08-08 ENCOUNTER — Other Ambulatory Visit: Payer: Self-pay

## 2021-08-08 ENCOUNTER — Encounter: Payer: Self-pay | Admitting: Internal Medicine

## 2021-08-08 VITALS — BP 126/70 | HR 80 | Temp 98.2°F | Ht 70.0 in | Wt 264.0 lb

## 2021-08-08 DIAGNOSIS — Z87891 Personal history of nicotine dependence: Secondary | ICD-10-CM | POA: Diagnosis not present

## 2021-08-08 DIAGNOSIS — R918 Other nonspecific abnormal finding of lung field: Secondary | ICD-10-CM | POA: Diagnosis not present

## 2021-08-08 DIAGNOSIS — J849 Interstitial pulmonary disease, unspecified: Secondary | ICD-10-CM

## 2021-08-08 DIAGNOSIS — J439 Emphysema, unspecified: Secondary | ICD-10-CM | POA: Diagnosis not present

## 2021-08-08 LAB — SEDIMENTATION RATE: Sed Rate: 5 mm/hr (ref 0–20)

## 2021-08-08 NOTE — Progress Notes (Signed)
OV 08/08/2021  Subjective:  Patient ID: Steven Mckee, male , DOB: May 25, 1951 , age 71 y.o. , MRN: [id number] , ADDRESS: [address] [place] [address] PCP Ileana Ladd, MD Patient Care Team: Ileana Ladd, MD as PCP - General (Family Medicine) Lyn Records, MD as PCP - Cardiology (Cardiology)  This Provider for this visit: Treatment Team:  Attending Provider: Kalman Shan, MD    08/08/2021 -   Chief Complaint  Patient presents with   Consult    Pt states he has his annual CT done due to his smoking history. States that he does not have any complaints.     HPI Steven Mckee 71 y.o. -former smoker who quit smoking after his wife passed away from lung cancer.  He has had 2 annual low-dose CT scans of the chest.  Both with similar results.  The most recent one in February 2023 that I personally visualized and showed him shows ILD changes.  He also has some emphysema.  However he feels fine.  He says that he works out 45 minutes cardiovascular 5 times a week.  At the end of this he would have mild shortness of breath that he feels is normal.  He can also climb 15 steps and a flight of stairs with load and only stop at the very end and feel mild shortness of breath that all improves with rest.  He has been referred here because of these findings.  Otherwise he is completely asymptomatic.  No cough.  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  He does get exposed to the sawdust from this.  But this only in recent times.  He is got a new diagnosis of sleep apnea and is going to start CPAP with oxygen at night..  It appears that overall he might prefer a conservative line of approach in his management but he is willing to go through work-up.  Of note when he was a child he had recurrent  respiratory infections with green sputum but never hospitalized for this.  This would self resolve    CT Chest data - LDCT 07/18/21  Narrative & Impression  CLINICAL DATA:  71 year old male former smoker (quit in 2010) with 45 pack-year history of smoking. Lung cancer screening examination.   EXAM: CT CHEST WITHOUT CONTRAST LOW-DOSE FOR LUNG CANCER SCREENING   TECHNIQUE: Multidetector CT imaging of the chest was performed following the standard protocol without IV contrast.   RADIATION DOSE REDUCTION: This exam was performed according to the departmental dose-optimization program which includes automated exposure control, adjustment of the mA and/or kV according to patient size and/or use of iterative reconstruction technique.   COMPARISON:  Low-dose lung cancer screening chest CT 07/18/2020.   FINDINGS: Cardiovascular: Heart size is normal. There is no significant pericardial fluid, thickening or pericardial calcification. There is aortic atherosclerosis, as well as atherosclerosis of the great vessels of the mediastinum and the coronary arteries, including calcified atherosclerotic plaque in the left main, left anterior descending, left circumflex and right coronary arteries.   Mediastinum/Nodes: No pathologically enlarged mediastinal or hilar lymph nodes. Esophagus is unremarkable in appearance. No axillary lymphadenopathy.   Lungs/Pleura: No suspicious appearing pulmonary nodules or masses are noted. No acute consolidative airspace disease. No pleural  effusions. Widespread but patchy areas of ground-glass attenuation, septal thickening, thickening of the peribronchovascular interstitium, regional architectural distortion and mild cylindrical bronchiectasis are noted, most evident throughout the mid to lower lungs, similar to the prior study.   Upper Abdomen: Aortic atherosclerosis. 13 mm calcified gallstone in the gallbladder. Diffuse low attenuation throughout the  visualized hepatic parenchyma, indicative of a background of severe hepatic steatosis.   Musculoskeletal: There are no aggressive appearing lytic or blastic lesions noted in the visualized portions of the skeleton.   IMPRESSION: 1. Lung-RADS 1S, negative. Continue annual screening with low-dose chest CT without contrast in 12 months. 2. The "S" modifier above refers to potentially clinically significant non lung cancer related findings. Specifically, the appearance of the lungs suggests interstitial lung disease, with a spectrum of findings considered probable usual interstitial pneumonia (UIP) per current ATS guidelines. Referral to Pulmonology for further clinical evaluation is recommended. 3. There is also mild diffuse bronchial wall thickening with mild centrilobular and paraseptal emphysema; imaging findings suggestive of underlying COPD. 4. Aortic atherosclerosis, in addition to left main and three-vessel coronary artery disease. Please note that although the presence of coronary artery calcium documents the presence of coronary artery disease, the severity of this disease and any potential stenosis cannot be assessed on this non-gated CT examination. Assessment for potential risk factor modification, dietary therapy or pharmacologic therapy may be warranted, if clinically indicated.   Aortic Atherosclerosis (ICD10-I70.0) and Emphysema (ICD10-J43.9).     Electronically Signed   By: Vinnie Langton M.D.   On: 07/19/2021 06:44    No results found.    PFT  No flowsheet data found.     has a past medical history of Angio-edema, CAD (coronary artery disease), Coronary artery disease, Diabetes mellitus without complication ([place]), HTN (hypertension) (10/28/2012), Hypercholesteremia, Hyperlipidemia (03/02/2014), Low testosterone, Metabolic syndrome, Mitral valve regurgitation, Myocardial infarction ([place]), Obesity, and Obesity.   reports that he quit smoking about 12 years  ago. His smoking use included cigarettes. He started smoking about 53 years ago. He has a 35.00 pack-year smoking history. He has never used smokeless tobacco.  Past Surgical History:  Procedure Laterality Date   COLONOSCOPY     SKIN SURGERY     Mohs   VENTRAL HERNIA REPAIR N/A 01/11/2020   Procedure: OPEN REPAIR VENTRAL HERNIA WITH MESH PATCH;  Surgeon: Armandina Gemma, MD;  Location: [facility];  Service: General;  Laterality: N/A;  LMA    No Known Allergies  Immunization History  Administered Date(s) Administered   Influenza-Unspecified 03/04/2013   PFIZER(Purple Top)SARS-COV-2 Vaccination 07/04/2019, 07/26/2019, 04/01/2020    Family History  Problem Relation Age of Onset   Dementia Mother    Allergic rhinitis Mother    Heart disease Father    Stroke Father    Allergic rhinitis Sister    Asthma Neg Hx    Eczema Neg Hx    Urticaria Neg Hx      Current Outpatient Medications:    aspirin EC 81 MG tablet, Take 1 tablet (81 mg total) by mouth daily., Disp: 90 tablet, Rfl: 3   diphenhydrAMINE (BENADRYL) 25 MG tablet, Take 25 mg by mouth every 6 (six) hours as needed for allergies., Disp: , Rfl:    halobetasol (ULTRAVATE) 0.05 % ointment, Apply 1 application topically daily as needed. (contact dermatitis), Disp: , Rfl:    ibuprofen (ADVIL) 200 MG tablet, Take 200-400 mg by mouth daily as needed for headache or moderate pain., Disp: , Rfl:    lisinopril-hydrochlorothiazide (ZESTORETIC) 20-12.5  MG tablet, TAKE 1 TABLET BY MOUTH DAILY, Disp: 90 tablet, Rfl: 3   metFORMIN (GLUCOPHAGE) 500 MG tablet, Take 500 mg by mouth daily with breakfast., Disp: , Rfl:    rosuvastatin (CRESTOR) 10 MG tablet, Take 1 tablet (10 mg total) by mouth daily., Disp: 90 tablet, Rfl: 3 No current facility-administered medications for this visit.  Facility-Administered Medications Ordered in Other Visits:    regadenoson (LEXISCAN) injection SOLN 0.4 mg, 0.4 mg, Intravenous, Once, Nahser, Wonda Cheng, MD       Objective:   Vitals:   08/08/21 1052  BP: 126/70  Pulse: 80  Temp: 98.2 F (36.8 C)  TempSrc: Oral  SpO2: 96%  Weight: 264 lb (119.7 kg)  Height: $Remove'5\' 10"'rVOtrxn$  (1.778 m)    Estimated body mass index is 37.88 kg/m as calculated from the following:   Height as of this encounter: $RemoveBeforeD'5\' 10"'cETQKyoCZRARVe$  (1.778 m).   Weight as of this encounter: 264 lb (119.7 kg).  $Rem'@WEIGHTCHANGE'CnwZ$ @  [employer]   08/08/21 1052  Weight: 264 lb (119.7 kg)     Physical Exam  General: No distress. Look well  Neuro: Alert and Oriented x 3. GCS 15. Speech normal Psych: Pleasant Resp:  Barrel Chest - no.  Wheeze - no, Crackles - no, No overt respiratory distress CVS: Normal heart sounds. Murmurs - no Ext: Stigmata of Connective Tissue Disease - no HEENT: Normal upper airway. PEERL +. No post nasal drip Visceral obesity +        Assessment:       ICD-10-CM   1. Abnormal CT lung screening  R91.8 Pulmonary function test    CT Chest High Resolution    Sedimentation rate    ANA    Anti-DNA antibody, double-stranded    Rheumatoid factor    Cyclic citrul peptide antibody, IgG    CK total and CKMB (cardiac)not at [facility]    Aldolase    Hypersensitivity Pneumonitis    Sjogren's syndrome antibods(ssa + ssb)    Anti-scleroderma antibody    QuantiFERON-TB Gold Plus    Alpha-1 antitrypsin phenotype    2. Former smoker  Z87.891 Pulmonary function test    CT Chest High Resolution    Sedimentation rate    ANA    Anti-DNA antibody, double-stranded    Rheumatoid factor    Cyclic citrul peptide antibody, IgG    CK total and CKMB (cardiac)not at [facility]    Aldolase    Hypersensitivity Pneumonitis    Sjogren's syndrome antibods(ssa + ssb)    Anti-scleroderma antibody    QuantiFERON-TB Gold Plus    Alpha-1 antitrypsin phenotype    3. ILD (interstitial lung disease) (HCC)  J84.9 Pulmonary function test    CT Chest High Resolution    Sedimentation rate    ANA    Anti-DNA antibody, double-stranded    Rheumatoid factor     Cyclic citrul peptide antibody, IgG    CK total and CKMB (cardiac)not at [facility]    Aldolase    Hypersensitivity Pneumonitis    Sjogren's syndrome antibods(ssa + ssb)    Anti-scleroderma antibody    QuantiFERON-TB Gold Plus    Alpha-1 antitrypsin phenotype    4. Pulmonary emphysema, unspecified emphysema type (HCC)  J43.9 Pulmonary function test    CT Chest High Resolution    Sedimentation rate    ANA    Anti-DNA antibody, double-stranded    Rheumatoid factor    Cyclic citrul peptide antibody, IgG    CK total and CKMB (cardiac)not at [facility]  Aldolase    Hypersensitivity Pneumonitis    Sjogren's syndrome antibods(ssa + ssb)    Anti-scleroderma antibody    QuantiFERON-TB Gold Plus    Alpha-1 antitrypsin phenotype         Plan:     Patient Instructions     ICD-10-CM   1. Abnormal CT lung screening  R91.8     2. Former smoker  Z87.891     3. ILD (interstitial lung disease) ([place])  J84.9     4. Pulmonary emphysema, unspecified emphysema type ([place])  J43.9        - I am concerned you  have Interstitial Lung Disease (ILD)  -  There are MANY varieties of this - To narrow down possibilities and assess severity please do the following tests  - do full PFT  - do walking test on room air in the office (not 6 min walk test) 08/08/2021 OR NEXT visit   - do High Resolution CT chest wo contrast - supine and prone, inspiratory and expiratory images (only Dr Rosario Jacks or Dr Weber Cooks or Dr Polly Cobia or Dr Laqueta Carina or Dr Burt Ek to read)  - do autoimmune panel: Serum: ESR, ANA, DS-DNA, RF, anti-CCP, ssA, ssB, scl-70,  Total CK,  Aldolase,  Hypersensitivity Pneumonitis Panel and QUantiferon GOld  - do blood work for alpha 1 anti-trypsin phenotype   - take ILD question packet home and bring it next vist  Followup  - nex 2-8 weeks wit DR Chase Caller - 30 min to review results and dicuss next steps     SIGNATURE    Dr. Brand Males, M.D., F.C.C.P,  Pulmonary and Critical Care  Medicine Staff Physician, [place] Director - Interstitial Lung Disease  Program  Pulmonary [place] [facility] [place], [place], [address]  Pager: [phone], If no answer or between  15:00h - 7:00h: call [phone] Telephone: [phone]  11:31 AM 08/08/2021

## 2021-08-08 NOTE — Patient Instructions (Addendum)
ICD-10-CM   1. Abnormal CT lung screening  R91.8     2. Former smoker  Z87.891     3. ILD (interstitial lung disease) ([place])  J84.9     4. Pulmonary emphysema, unspecified emphysema type ([place])  J43.9        - I am concerned you  have Interstitial Lung Disease (ILD)  -  There are MANY varieties of this - To narrow down possibilities and assess severity please do the following tests  - do full PFT  - do walking test on room air in the office (not 6 min walk test) 08/08/2021 OR NEXT visit   - do High Resolution CT chest wo contrast - supine and prone, inspiratory and expiratory images (only Dr Rosario Jacks or Dr Weber Cooks or Dr Polly Cobia or Dr Laqueta Carina or Dr Burt Ek to read)  - do autoimmune panel: Serum: ESR, ANA, DS-DNA, RF, anti-CCP, ssA, ssB, scl-70,  Total CK,  Aldolase,  Hypersensitivity Pneumonitis Panel and QUantiferon GOld  - do blood work for alpha 1 anti-trypsin phenotype   - take ILD question packet home and bring it next vist  Followup  - nex 2-8 weeks wit DR Chase Caller - 30 min to review results and dicuss next steps

## 2021-08-09 LAB — CK TOTAL AND CKMB (NOT AT ARMC)
CK, MB: 2.6 ng/mL (ref 0–5.0)
Relative Index: 1 (ref 0–4.0)
Total CK: 268 U/L — ABNORMAL HIGH (ref 44–196)

## 2021-08-09 LAB — ALDOLASE: Aldolase: 5 U/L (ref <=8.1)

## 2021-08-11 LAB — QUANTIFERON-TB GOLD PLUS
Mitogen-NIL: >10 IU/mL
NIL: 0.01 IU/mL
QuantiFERON-TB Gold Plus: NEGATIVE
TB1-NIL: 0 IU/mL
TB2-NIL: 0 IU/mL

## 2021-08-11 LAB — HYPERSENSITIVITY PNEUMONITIS
A. Pullulans Abs: NEGATIVE
A.Fumigatus #1 Abs: NEGATIVE
Micropolyspora faeni, IgG: NEGATIVE
Pigeon Serum Abs: NEGATIVE
Thermoact. Saccharii: NEGATIVE
Thermoactinomyces vulgaris, IgG: NEGATIVE

## 2021-08-18 DIAGNOSIS — H25812 Combined forms of age-related cataract, left eye: Secondary | ICD-10-CM | POA: Diagnosis not present

## 2021-08-18 LAB — SJOGREN'S SYNDROME ANTIBODS(SSA + SSB)
SSA (Ro) (ENA) Antibody, IgG: <1 AI
SSB (La) (ENA) Antibody, IgG: <1 AI

## 2021-08-18 LAB — ANTI-SCLERODERMA ANTIBODY: Scleroderma (Scl-70) (ENA) Antibody, IgG: <1 AI

## 2021-08-18 LAB — ANTI-DNA ANTIBODY, DOUBLE-STRANDED: ds DNA Ab: 2 IU/mL

## 2021-08-18 LAB — ALPHA-1 ANTITRYPSIN PHENOTYPE: A-1 Antitrypsin, Ser: 151 mg/dL (ref 83–199)

## 2021-08-18 LAB — RHEUMATOID FACTOR: Rheumatoid fact SerPl-aCnc: <14 IU/mL (ref <14)

## 2021-08-18 LAB — CYCLIC CITRUL PEPTIDE ANTIBODY, IGG: Cyclic Citrullin Peptide Ab: <16 UNITS

## 2021-08-18 LAB — ANA: Anti Nuclear Antibody (ANA): NEGATIVE

## 2021-08-23 DIAGNOSIS — G4733 Obstructive sleep apnea (adult) (pediatric): Secondary | ICD-10-CM | POA: Diagnosis not present

## 2021-09-01 DIAGNOSIS — H25811 Combined forms of age-related cataract, right eye: Secondary | ICD-10-CM | POA: Diagnosis not present

## 2021-09-04 ENCOUNTER — Other Ambulatory Visit: Payer: Self-pay

## 2021-09-04 ENCOUNTER — Ambulatory Visit (INDEPENDENT_AMBULATORY_CARE_PROVIDER_SITE_OTHER)
Admission: RE | Admit: 2021-09-04 | Discharge: 2021-09-04 | Disposition: A | Discharge status: Home / Self Care | Payer: Medicare Other | Source: Ambulatory Visit | Attending: Internal Medicine | Admitting: Internal Medicine

## 2021-09-04 DIAGNOSIS — I700 Atherosclerosis of aorta: Secondary | ICD-10-CM | POA: Diagnosis not present

## 2021-09-04 DIAGNOSIS — J479 Bronchiectasis, uncomplicated: Secondary | ICD-10-CM | POA: Diagnosis not present

## 2021-09-04 DIAGNOSIS — J432 Centrilobular emphysema: Secondary | ICD-10-CM | POA: Diagnosis not present

## 2021-09-04 DIAGNOSIS — R918 Other nonspecific abnormal finding of lung field: Secondary | ICD-10-CM

## 2021-09-04 DIAGNOSIS — I2510 Atherosclerotic heart disease of native coronary artery without angina pectoris: Secondary | ICD-10-CM | POA: Diagnosis not present

## 2021-09-04 DIAGNOSIS — Z87891 Personal history of nicotine dependence: Secondary | ICD-10-CM

## 2021-09-04 DIAGNOSIS — J439 Emphysema, unspecified: Secondary | ICD-10-CM

## 2021-09-04 DIAGNOSIS — J849 Interstitial pulmonary disease, unspecified: Secondary | ICD-10-CM

## 2021-09-04 IMAGING — CT CT CHEST HIGH RESOLUTION
2 of 8 series · 14 of 36 positions shown, 17 images · non-contrast
Comparison: Low-dose lung cancer screening chest CT 07/18/2021.

CLINICAL DATA: 70-year-old male with history of abnormal appearance
of the lungs on prior low-dose lung cancer screening chest CT.
Follow-up study. Evaluate for interstitial lung disease.



[Series 5: high resolution · axial · 0.80mm/px · z∈[-250,-9]mm · 11 of 291 slices shown, 14 images]
[im 25/291  mediastinal]
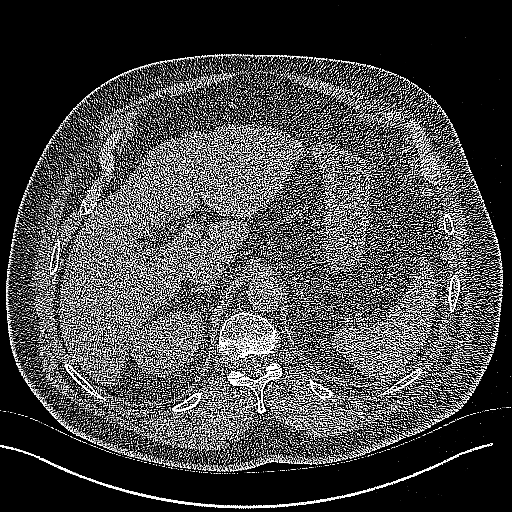
[im 25/291  lung]
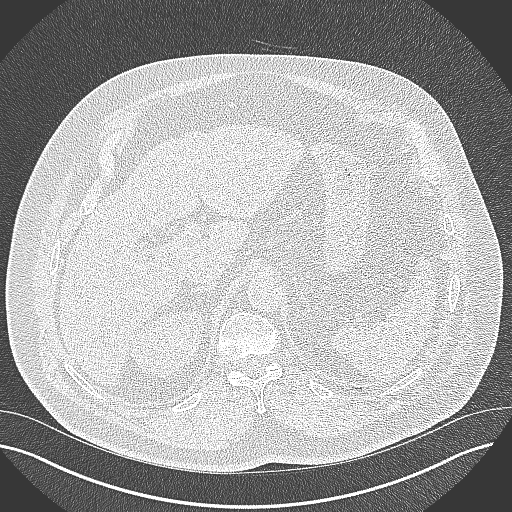
[im 49/291  lung]
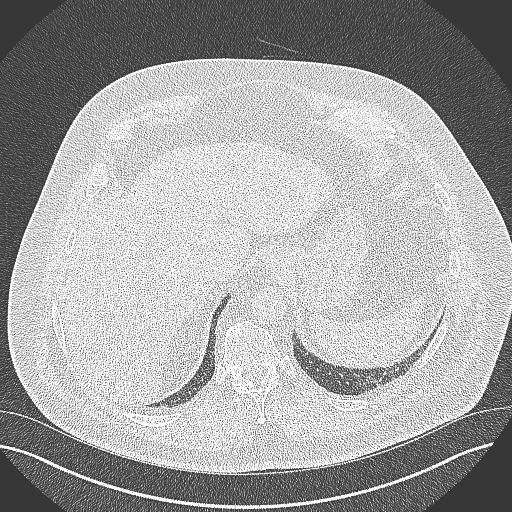
[im 73/291  lung]
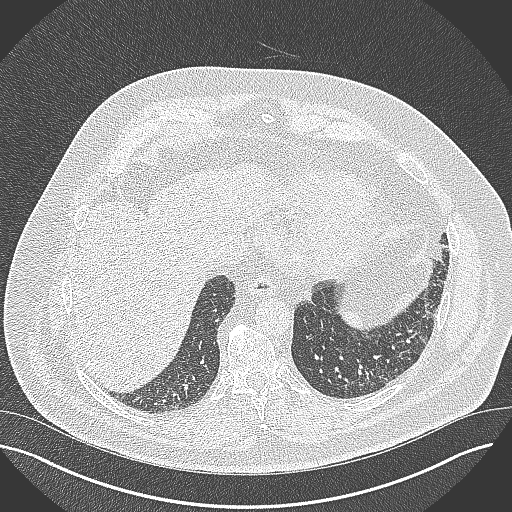
[im 97/291  lung]
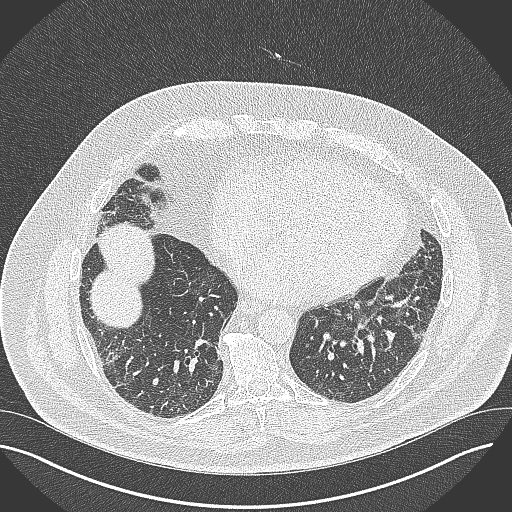
[im 121/291  mediastinal]
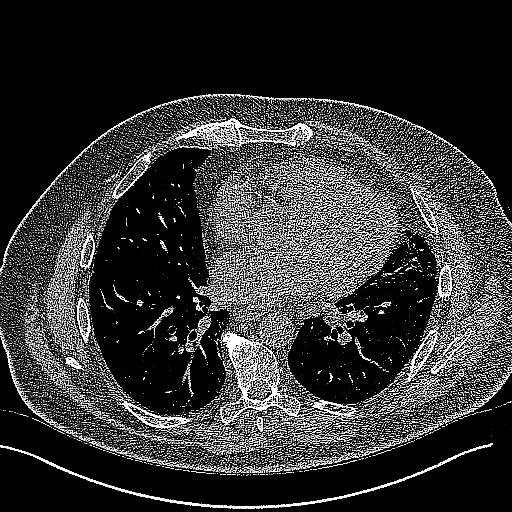
[im 121/291  lung]
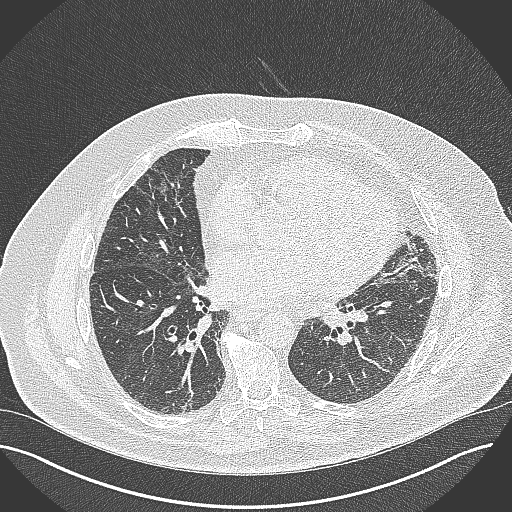
[im 146/291  lung]
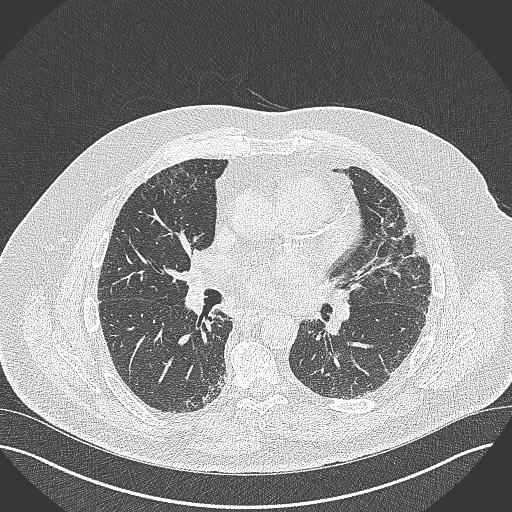
[im 170/291  lung]
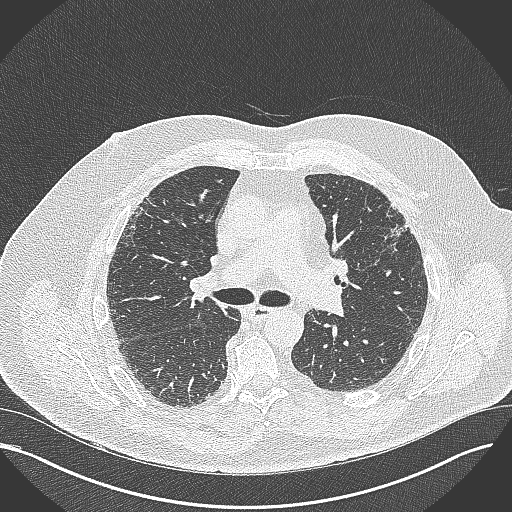
[im 194/291  lung]
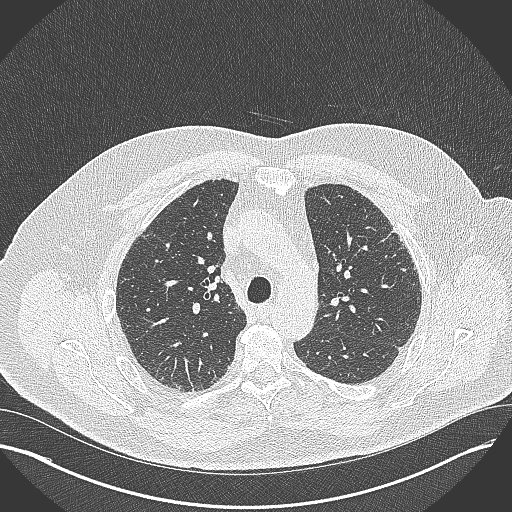
[im 218/291  mediastinal]
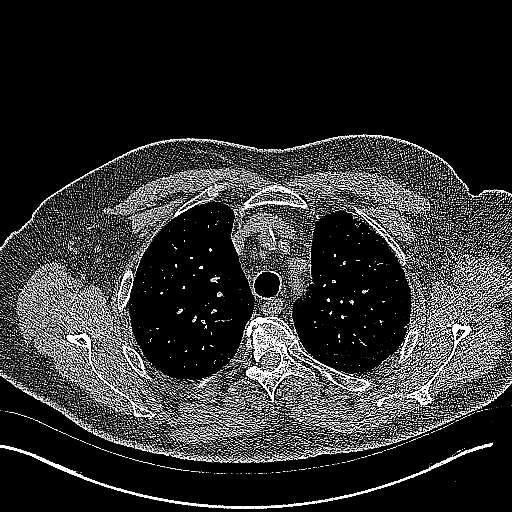
[im 218/291  lung]
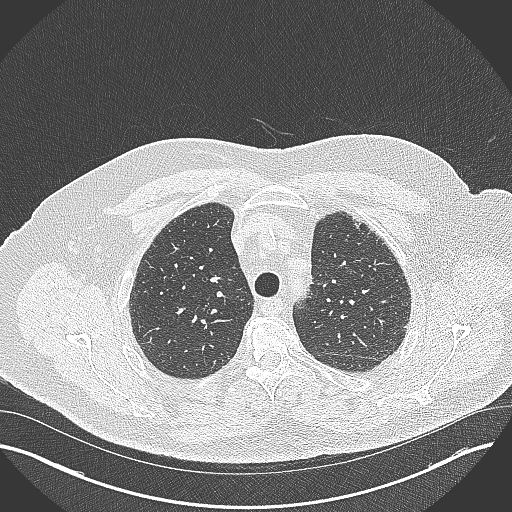
[im 242/291  lung]
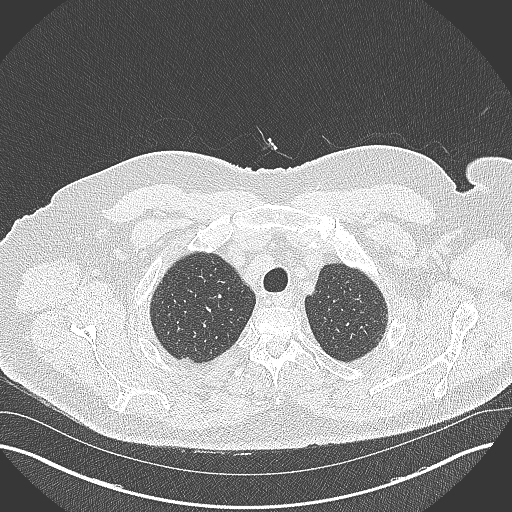
[im 266/291  lung]
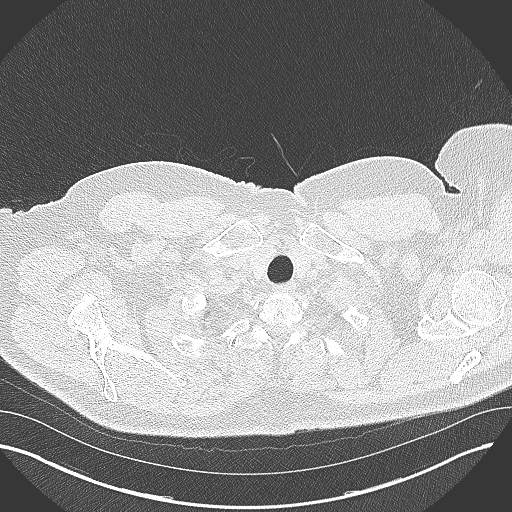

[Series 7: coronal · coronal · 0.59mm/px · 3 of 138 slices shown]
[im 28/138  lung]
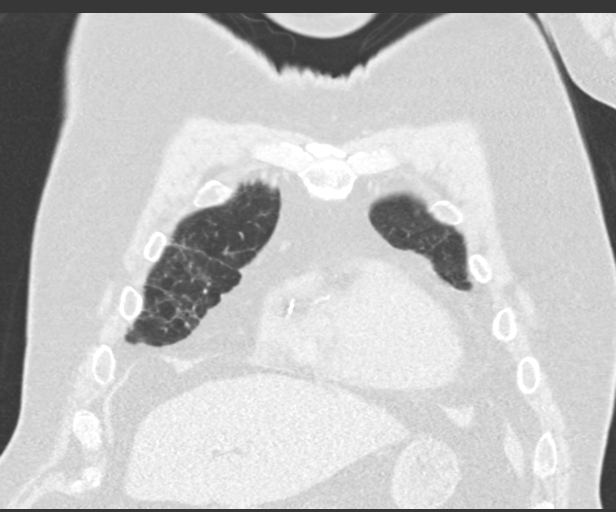
[im 55/138  lung]
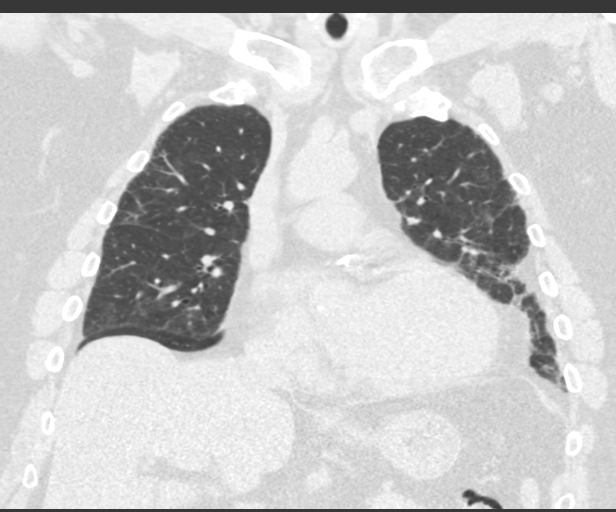
[im 83/138  lung]
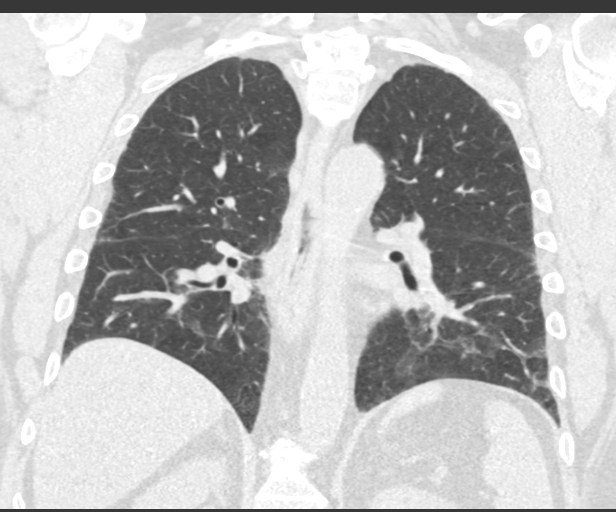

[14 of 36 positions shown; findings below may reference images not displayed]

FINDINGS: Cardiovascular: Heart size is normal. There is no significant
pericardial fluid, thickening or pericardial calcification. There is
aortic atherosclerosis, as well as atherosclerosis of the great
vessels of the mediastinum and the coronary arteries, including
calcified atherosclerotic plaque in the left main, left anterior
descending, left circumflex and right coronary arteries.

Mediastinum/Nodes: Mediastinal or no pathologically enlarged hilar
lymph nodes. Please note that accurate exclusion of hilar adenopathy
is limited on noncontrast CT scans. Esophagus is unremarkable in
appearance. No axillary lymphadenopathy.

Lungs/Pleura: High-resolution images demonstrate patchy areas of
ground-glass attenuation, septal thickening, mild cylindrical
bronchiectasis, peripheral bronchiolectasis, thickening of the
peribronchovascular interstitium and regional areas of architectural
distortion. These findings have a definitive craniocaudal gradient
and appear similar to the recent prior examination. No frank
honeycombing. Inspiratory and expiratory imaging demonstrates some
mild air trapping indicative of mild small airways disease. No acute
consolidative airspace disease. No pleural effusions. No definite
suspicious appearing pulmonary nodules or masses are noted. Mild
centrilobular and paraseptal emphysema.

Upper Abdomen: Aortic atherosclerosis. Calcified gallstone in the
neck of the gallbladder measuring 1.4 cm. Gallbladder does not
appear distended. Diffuse low attenuation throughout the visualized
hepatic parenchyma, indicative of a background of hepatic steatosis.

Musculoskeletal: There are no aggressive appearing lytic or blastic
lesions noted in the visualized portions of the skeleton.
IMPRESSION: 1. The appearance of the lungs is compatible with interstitial lung
disease, with a spectrum of findings considered probable usual
interstitial pneumonia (UIP) per current ATS guidelines. However,
given the presence of air trapping, and relative stability compared
to prior examinations dating back to 08/07/2020, the possibility of
fibrotic phase nonspecific interstitial pneumonia (NSIP) warrants
consideration. Repeat high-resolution chest CT is recommended in 12
months to assess for temporal changes in the appearance of the lung
parenchyma.
2. Aortic atherosclerosis, in addition to left main and three-vessel
coronary artery disease. Assessment for potential risk factor
modification, dietary therapy or pharmacologic therapy may be
warranted, if clinically indicated.
3. Mild diffuse bronchial wall thickening with mild centrilobular
and paraseptal emphysema; imaging findings suggestive of underlying
COPD.
4. Cholelithiasis without evidence of acute cholecystitis.
5. Hepatic steatosis.

Aortic Atherosclerosis (N4092-MJR.R).

## 2021-09-06 DIAGNOSIS — L400 Psoriasis vulgaris: Secondary | ICD-10-CM | POA: Diagnosis not present

## 2021-09-23 DIAGNOSIS — M722 Plantar fascial fibromatosis: Secondary | ICD-10-CM | POA: Diagnosis not present

## 2021-09-23 DIAGNOSIS — G4733 Obstructive sleep apnea (adult) (pediatric): Secondary | ICD-10-CM | POA: Diagnosis not present

## 2021-09-23 DIAGNOSIS — M7661 Achilles tendinitis, right leg: Secondary | ICD-10-CM | POA: Diagnosis not present

## 2021-09-24 ENCOUNTER — Other Ambulatory Visit: Payer: Self-pay | Admitting: Interventional Cardiology

## 2021-10-02 ENCOUNTER — Other Ambulatory Visit: Payer: Self-pay | Admitting: Interventional Cardiology

## 2021-10-03 ENCOUNTER — Other Ambulatory Visit: Payer: Self-pay

## 2021-10-03 MED ORDER — LISINOPRIL-HYDROCHLOROTHIAZIDE 20-12.5 MG PO TABS: 1.0000 | ORAL_TABLET | Freq: Every day | ORAL | 0 refills | Status: DC

## 2021-10-04 ENCOUNTER — Encounter: Payer: Self-pay | Admitting: Interventional Cardiology

## 2021-10-16 DIAGNOSIS — E7800 Pure hypercholesterolemia, unspecified: Secondary | ICD-10-CM | POA: Diagnosis not present

## 2021-10-16 DIAGNOSIS — E1159 Type 2 diabetes mellitus with other circulatory complications: Secondary | ICD-10-CM | POA: Diagnosis not present

## 2021-10-16 DIAGNOSIS — L659 Nonscarring hair loss, unspecified: Secondary | ICD-10-CM | POA: Diagnosis not present

## 2021-10-19 DIAGNOSIS — M79671 Pain in right foot: Secondary | ICD-10-CM | POA: Diagnosis not present

## 2021-10-19 DIAGNOSIS — G4733 Obstructive sleep apnea (adult) (pediatric): Secondary | ICD-10-CM | POA: Diagnosis not present

## 2021-10-19 DIAGNOSIS — M79672 Pain in left foot: Secondary | ICD-10-CM | POA: Diagnosis not present

## 2021-10-19 DIAGNOSIS — I2510 Atherosclerotic heart disease of native coronary artery without angina pectoris: Secondary | ICD-10-CM | POA: Diagnosis not present

## 2021-10-19 DIAGNOSIS — I700 Atherosclerosis of aorta: Secondary | ICD-10-CM | POA: Diagnosis not present

## 2021-10-19 DIAGNOSIS — M7661 Achilles tendinitis, right leg: Secondary | ICD-10-CM | POA: Diagnosis not present

## 2021-10-19 DIAGNOSIS — E7800 Pure hypercholesterolemia, unspecified: Secondary | ICD-10-CM | POA: Diagnosis not present

## 2021-10-19 DIAGNOSIS — M722 Plantar fascial fibromatosis: Secondary | ICD-10-CM | POA: Diagnosis not present

## 2021-10-19 DIAGNOSIS — M7052 Other bursitis of knee, left knee: Secondary | ICD-10-CM | POA: Diagnosis not present

## 2021-10-19 DIAGNOSIS — E1159 Type 2 diabetes mellitus with other circulatory complications: Secondary | ICD-10-CM | POA: Diagnosis not present

## 2021-10-19 DIAGNOSIS — I10 Essential (primary) hypertension: Secondary | ICD-10-CM | POA: Diagnosis not present

## 2021-10-19 DIAGNOSIS — Z9989 Dependence on other enabling machines and devices: Secondary | ICD-10-CM | POA: Diagnosis not present

## 2021-10-23 DIAGNOSIS — G4733 Obstructive sleep apnea (adult) (pediatric): Secondary | ICD-10-CM | POA: Diagnosis not present

## 2021-10-27 ENCOUNTER — Other Ambulatory Visit: Payer: Self-pay | Admitting: Acute Care

## 2021-10-27 DIAGNOSIS — Z87891 Personal history of nicotine dependence: Secondary | ICD-10-CM

## 2021-10-27 DIAGNOSIS — Z122 Encounter for screening for malignant neoplasm of respiratory organs: Secondary | ICD-10-CM

## 2021-10-30 DIAGNOSIS — M79671 Pain in right foot: Secondary | ICD-10-CM | POA: Diagnosis not present

## 2021-10-30 DIAGNOSIS — M79672 Pain in left foot: Secondary | ICD-10-CM | POA: Diagnosis not present

## 2021-11-01 DIAGNOSIS — M79672 Pain in left foot: Secondary | ICD-10-CM | POA: Diagnosis not present

## 2021-11-01 DIAGNOSIS — M79671 Pain in right foot: Secondary | ICD-10-CM | POA: Diagnosis not present

## 2021-11-07 ENCOUNTER — Ambulatory Visit: Payer: Medicare Other | Admitting: Cardiology

## 2021-11-07 ENCOUNTER — Encounter: Payer: Self-pay | Admitting: Cardiology

## 2021-11-07 VITALS — BP 124/66 | HR 88 | Ht 70.0 in | Wt 266.6 lb

## 2021-11-07 DIAGNOSIS — G4733 Obstructive sleep apnea (adult) (pediatric): Secondary | ICD-10-CM | POA: Diagnosis not present

## 2021-11-07 DIAGNOSIS — I10 Essential (primary) hypertension: Secondary | ICD-10-CM | POA: Diagnosis not present

## 2021-11-07 MED ORDER — SALINE SPRAY 0.65 % NA SOLN: 2.0000 | Freq: Two times a day (BID) | NASAL | 0 refills | Status: AC

## 2021-11-07 MED ORDER — FLUTICASONE PROPIONATE 50 MCG/ACT NA SUSP: 1.0000 | Freq: Every evening | NASAL | 3 refills | Status: DC

## 2021-11-07 NOTE — Progress Notes (Signed)
Date:  11/07/2021   ID:  Steven Mckee, DOB August 13, 1950, MRN [id number] The patient was identified using 2 identifiers.  PCP:  [name], MD   [facility] Providers Cardiologist:  Sinclair Grooms, MD Sleep Medicine:  Fransico Him, MD     Evaluation Performed:  Follow-Up Visit  Chief Complaint: OSA  History of Present Illness:    Steven Mckee is a 71 y.o. male with a history of a SCID, diabetes type 2, hypertension, hyperlipidemia and obesity.  He underwent split-night sleep study on 03/09/2021 for a history of snoring as well as hypertension and diabetes mellitus.  He tells me that he talked with Dr. Tamala Julian about getting up frequent times a night to urinate and was not sleeping well waking up 4-5 times nightly and not getting a good night of sleep but partly due to issues with body pains.  His wife keeps him awake with her breathing as well and would have to go out to sleep in the recliner.   He was found to have moderate obstructive sleep apnea with an AHI of 23.6/h and underwent CPAP titration to 14 cm H2O. at last office visit we discussed treatment options.  He was not a candidate for the inspire device due to his elevated BMI.  He was subsequently started on CPAP therapy and is here for follow-up.  He is doing well with his CPAP device and thinks that he has gotten used to it.  He tolerates the mask and feels the pressure is adequate.  He is sleeping better at night and only gets up to urinate once or twice a night to urinate.  Since going on CPAP he feels rested in the am and has no significant daytime sleepiness.  He denies any significant mouth or nasal dryness or nasal congestion.  He does not think that he snores.     Past Medical History:  Diagnosis Date   Angio-edema    CAD (coronary artery disease)    Diabetes mellitus without complication (HCC)    type 2   HTN (hypertension) 10/28/2012   Hypercholesteremia    Low testosterone    Metabolic syndrome     Mitral valve regurgitation    Myocardial infarction [facility])    Obesity    Past Surgical History:  Procedure Laterality Date   COLONOSCOPY     SKIN SURGERY     Mohs   VENTRAL HERNIA REPAIR N/A 01/11/2020   Procedure: OPEN REPAIR VENTRAL HERNIA WITH MESH PATCH;  Surgeon: Armandina Gemma, MD;  Location: [facility] ORS;  Service: General;  Laterality: N/A;  LMA     Current Meds  Medication Sig   aspirin EC 81 MG tablet Take 1 tablet (81 mg total) by mouth daily.   fexofenadine-pseudoephedrine (ALLEGRA-D 24) 180-240 MG 24 hr tablet Take 1 tablet by mouth daily.   halobetasol (ULTRAVATE) 0.05 % ointment Apply 1 application topically daily as needed. (contact dermatitis)   ibuprofen (ADVIL) 200 MG tablet Take 200-400 mg by mouth daily as needed for headache or moderate pain.   lisinopril-hydrochlorothiazide (ZESTORETIC) 20-12.5 MG tablet Take 1 tablet by mouth daily.   metFORMIN (GLUCOPHAGE) 500 MG tablet Take 500 mg by mouth daily with breakfast.   rosuvastatin (CRESTOR) 10 MG tablet Take 1 tablet (10 mg total) by mouth daily.     Allergies:   Patient has no known allergies.   Social History   Tobacco Use   Smoking status: Former    Packs/day: 1.00  Years: 35.00    Pack years: 35.00    Types: Cigarettes    Start date: 24    Quit date: 03/05/2009    Years since quitting: 12.6   Smokeless tobacco: Never  Vaping Use   Vaping Use: Never used  Substance Use Topics   Alcohol use: Not Currently    Alcohol/week: 0.0 standard drinks    Comment: Occasional   Drug use: Never     Family Hx: The patient's family history includes Allergic rhinitis in his mother and sister; Dementia in his mother; Heart disease in his father; Stroke in his father. There is no history of Asthma, Eczema, or Urticaria.  ROS:   Please see the history of present illness.     All other systems reviewed and are negative.   Prior CV studies:   The following studies were reviewed today:  Split-night sleep  study  Labs/Other Tests and Data Reviewed:    EKG:  No ECG reviewed.  Recent Labs: 02/01/2021: ALT 35   Recent Lipid Panel Lab Results  Component Value Date/Time   CHOL 128 02/01/2021 09:39 AM   CHOL 126 11/13/2012 08:41 AM   TRIG 62 02/01/2021 09:39 AM   TRIG 68 06/15/2013 08:56 AM   TRIG 112 11/13/2012 08:41 AM   HDL 53 02/01/2021 09:39 AM   HDL 61 06/15/2013 08:56 AM   HDL 46 11/13/2012 08:41 AM   CHOLHDL 2.4 02/01/2021 09:39 AM   CHOLHDL 3 02/26/2014 07:56 AM   LDLCALC 62 02/01/2021 09:39 AM   LDLCALC 78 06/15/2013 08:56 AM   LDLCALC 58 11/13/2012 08:41 AM    Wt Readings from Last 3 Encounters:  11/07/21 266 lb 9.6 oz (120.9 kg)  08/08/21 264 lb (119.7 kg)  07/19/21 260 lb (117.9 kg)     Risk Assessment/Calculations:          Objective:    Vital Signs:  BP 124/66   Pulse 88   Ht '5\' 10"'$  (1.778 m)   Wt 266 lb 9.6 oz (120.9 kg)   SpO2 96%   BMI 38.25 kg/m    GEN: Well nourished, well developed in no acute distress HEENT: Normal NECK: No JVD; No carotid bruits LYMPHATICS: No lymphadenopathy CARDIAC:RRR, no murmurs, rubs, gallops RESPIRATORY:  Clear to auscultation without rales, wheezing or rhonchi  ABDOMEN: Soft, non-tender, non-distended MUSCULOSKELETAL:  No edema; No deformity  SKIN: Warm and dry NEUROLOGIC:  Alert and oriented x 3 PSYCHIATRIC:  Normal affect    ASSESSMENT & PLAN:    OSA - The patient is tolerating PAP therapy well without any problems. The PAP download performed by his DME was personally reviewed and interpreted by me today and showed an AHI of 1.4 /hr on 14 cm H2O with 57% compliance in using more than 4 hours nightly.  The patient has been using and benefiting from PAP use and [name] continue to benefit from therapy.  -I encouraged him to be more more compliant with his device (he recently went on a trip that he could not use his device and also had issues with nasal congestion until he started on PRN Afrin) -order supplies to change  out his cushion every 4-6 weeks -he would like to change his DME to [place]   Hypertension -BP is controlled on exam today -Continue prescription drug management with lisinopril HCT 20-12.5 mg daily with as needed refills   Medication Adjustments/Labs and Tests Ordered: Current medicines are reviewed at length with the patient today.  Concerns regarding  medicines are outlined above.   Tests Ordered: No orders of the defined types were placed in this encounter.   Medication Changes: No orders of the defined types were placed in this encounter.   Follow Up:  In Person in 6 week(s)  Signed, Fransico Him, MD  11/07/2021 10:08 AM    [place]

## 2021-11-07 NOTE — Patient Instructions (Signed)
Medication Instructions:  Your physician has recommended you make the following change in your medication:  1) START using nasal saline spray - two sprays in each nostril twice daily  2) START using Flonase 1 spray in each nostril every night  *If you need a refill on your cardiac medications before your next appointment, please call your pharmacy*  Follow-Up: At [facility], you and your health needs are our priority.  As part of our continuing mission to provide you with exceptional heart care, we have created designated Provider Care Teams.  These Care Teams include your primary Cardiologist (physician) and Advanced Practice Providers (APPs -  Physician Assistants and Nurse Practitioners) who all work together to provide you with the care you need, when you need it.   Your next appointment:   1 year(s)  The format for your next appointment:   In Person  Provider:   Fransico Him, MD  Important Information About Sugar

## 2021-11-07 NOTE — Addendum Note (Signed)
Addended by: Antonieta Iba on: 11/07/2021 10:16 AM   Modules accepted: Orders

## 2021-11-08 DIAGNOSIS — M79672 Pain in left foot: Secondary | ICD-10-CM | POA: Diagnosis not present

## 2021-11-08 DIAGNOSIS — M79671 Pain in right foot: Secondary | ICD-10-CM | POA: Diagnosis not present

## 2021-11-09 ENCOUNTER — Telehealth: Payer: Self-pay | Admitting: *Deleted

## 2021-11-09 DIAGNOSIS — G4733 Obstructive sleep apnea (adult) (pediatric): Secondary | ICD-10-CM

## 2021-11-09 NOTE — Telephone Encounter (Signed)
-----   Message from Antonieta Iba, RN sent at 11/07/2021 10:41 AM EDT ----- Per Dr. Radford Pax: Please change his DME to [place] and order CPAP supplies.  Thanks!

## 2021-11-09 NOTE — Telephone Encounter (Signed)
Patient has been switched to [facility] ordered

## 2021-11-13 ENCOUNTER — Encounter: Payer: Self-pay | Admitting: Cardiology

## 2021-11-13 DIAGNOSIS — M79672 Pain in left foot: Secondary | ICD-10-CM | POA: Diagnosis not present

## 2021-11-13 DIAGNOSIS — M79671 Pain in right foot: Secondary | ICD-10-CM | POA: Diagnosis not present

## 2021-11-23 ENCOUNTER — Encounter: Payer: Self-pay | Admitting: Cardiology

## 2021-11-23 ENCOUNTER — Telehealth: Payer: Self-pay | Admitting: *Deleted

## 2021-11-23 ENCOUNTER — Encounter: Payer: Medicare Other | Admitting: Cardiology

## 2021-11-23 VITALS — BP 112/66 | HR 83 | Ht 70.0 in | Wt 260.0 lb

## 2021-11-23 DIAGNOSIS — G4733 Obstructive sleep apnea (adult) (pediatric): Secondary | ICD-10-CM | POA: Diagnosis not present

## 2021-11-23 DIAGNOSIS — I10 Essential (primary) hypertension: Secondary | ICD-10-CM

## 2021-11-23 NOTE — Progress Notes (Signed)
This encounter was created in error - please disregard.

## 2021-11-23 NOTE — Telephone Encounter (Signed)
I received a call today from [facility] with [facility] notifying me the patient requests that his CPAP therapy be switched from Dr Radford Pax to Dr Claiborne Billings. If this cannot be done then he Steven Mckee switch care to [facility]. Providers Luther be notified of the patient's request.

## 2021-11-24 NOTE — Telephone Encounter (Signed)
Ok for switch since approved by [facility]

## 2021-11-28 ENCOUNTER — Telehealth: Payer: Self-pay | Admitting: *Deleted

## 2021-11-28 NOTE — Telephone Encounter (Signed)
Patient notified of sleep MD change approval via Mychart.

## 2021-11-28 NOTE — Telephone Encounter (Signed)
Patient Steven Mckee be notified via Mychart ok to switch sleep management from Dr Radford Pax to Dr Claiborne Billings.

## 2021-12-05 ENCOUNTER — Encounter: Payer: Self-pay | Admitting: Cardiology

## 2021-12-05 ENCOUNTER — Ambulatory Visit: Payer: Medicare Other | Admitting: Cardiology

## 2021-12-05 VITALS — BP 114/60 | HR 74 | Ht 70.0 in | Wt 270.0 lb

## 2021-12-05 DIAGNOSIS — I10 Essential (primary) hypertension: Secondary | ICD-10-CM | POA: Diagnosis not present

## 2021-12-05 DIAGNOSIS — J310 Chronic rhinitis: Secondary | ICD-10-CM | POA: Diagnosis not present

## 2021-12-05 DIAGNOSIS — Z9989 Dependence on other enabling machines and devices: Secondary | ICD-10-CM

## 2021-12-05 DIAGNOSIS — G4733 Obstructive sleep apnea (adult) (pediatric): Secondary | ICD-10-CM | POA: Diagnosis not present

## 2021-12-05 NOTE — Progress Notes (Signed)
Date:  12/05/2021   ID:  Steven Mckee, DOB 07-17-1950, MRN [id number] The patient was identified using 2 identifiers.  PCP:  Ileana Ladd, MD   [facility] Providers Cardiologist:  Lesleigh Noe, MD Sleep Medicine:  Armanda Magic, MD     Evaluation Performed:  Follow-Up Visit  Chief Complaint: OSA  History of Present Illness:    Steven Mckee is a 71 y.o. male with a history of a SCID, diabetes type 2, hypertension, hyperlipidemia and obesity.  He underwent split-night sleep study on 03/09/2021 for a history of snoring as well as hypertension and diabetes mellitus.  He tells me that he talked with Dr. Katrinka Blazing about getting up frequent times a night to urinate and was not sleeping well waking up 4-5 times nightly and not getting a good night of sleep but partly due to issues with body pains.  His wife keeps him awake with her breathing as well and would have to go out to sleep in the recliner.   He was found to have moderate obstructive sleep apnea with an AHI of 23.6/h and underwent CPAP titration to 14 cm H2O. at last office visit we discussed treatment options.  He was not a candidate for the inspire device due to his elevated BMI.  He was subsequently started on CPAP therapy and is here for follow-up.  He is doing well with his CPAP device and thinks that he has gotten used to it.  He tolerates the mask and feels the pressure is adequate.  He thinks that he is sleeping better now as he has to sleep on his back to keep the mask stable and all his body aches have improved and sleeping better.  He only gets up once nightly to use the bathroom.  Since going on CPAP he feels rested in the am and has no significant daytime sleepiness.    He has had problems with nasal congestion and saw an allergist and was not dx with allergies.  He takes Allegra D, Flonase and nasal saline spray.  He is still having problems with nasal congestion and obstruction and now has to use Afrin PRN.  He has  not been referred to ENT in the past.  He is also seeing Dr. Marchelle Gearing with Pulmonary for mild ILD.   Past Medical History:  Diagnosis Date   Angio-edema    CAD (coronary artery disease)    Diabetes mellitus without complication (HCC)    type 2   HTN (hypertension) 10/28/2012   Hypercholesteremia    Low testosterone    Metabolic syndrome    Mitral valve regurgitation    Myocardial infarction (HCC)    Obesity    OSA on CPAP    Past Surgical History:  Procedure Laterality Date   COLONOSCOPY     SKIN SURGERY     Mohs   VENTRAL HERNIA REPAIR N/A 01/11/2020   Procedure: OPEN REPAIR VENTRAL HERNIA WITH MESH PATCH;  Surgeon: Darnell Level, MD;  Location: [facility];  Service: General;  Laterality: N/A;  LMA     Current Meds  Medication Sig   aspirin EC 81 MG tablet Take 1 tablet (81 mg total) by mouth daily.   fexofenadine-pseudoephedrine (ALLEGRA-D 24) 180-240 MG 24 hr tablet Take 1 tablet by mouth daily.   fluocinonide (LIDEX) 0.05 % external solution daily at 6 (six) AM.   fluticasone (FLONASE) 50 MCG/ACT nasal spray Place 1 spray into both nostrils at bedtime.   halobetasol (ULTRAVATE) 0.05 %  ointment Apply 1 application topically daily as needed. (contact dermatitis)   ibuprofen (ADVIL) 200 MG tablet Take 200-400 mg by mouth daily as needed for headache or moderate pain.   lisinopril-hydrochlorothiazide (ZESTORETIC) 20-12.5 MG tablet Take 1 tablet by mouth daily.   metFORMIN (GLUCOPHAGE) 500 MG tablet Take 500 mg by mouth daily with breakfast.   rosuvastatin (CRESTOR) 10 MG tablet Take 1 tablet (10 mg total) by mouth daily.   sodium chloride (OCEAN) 0.65 % SOLN nasal spray Place 2 sprays into both nostrils in the morning and at bedtime.     Allergies:   Patient has no known allergies.   Social History   Tobacco Use   Smoking status: Former    Packs/day: 1.00    Years: 35.00    Total pack years: 35.00    Types: Cigarettes    Start date: 50    Quit date: 03/05/2009    Years  since quitting: 12.7   Smokeless tobacco: Never  Vaping Use   Vaping Use: Never used  Substance Use Topics   Alcohol use: Not Currently    Alcohol/week: 0.0 standard drinks of alcohol    Comment: Occasional   Drug use: Never     Family Hx: The patient's family history includes Allergic rhinitis in his mother and sister; Dementia in his mother; Heart disease in his father; Stroke in his father. There is no history of Asthma, Eczema, or Urticaria.  ROS:   Please see the history of present illness.     All other systems reviewed and are negative.   Prior CV studies:   The following studies were reviewed today:  Split-night sleep study  Labs/Other Tests and Data Reviewed:    EKG:  No ECG reviewed.  Recent Labs: 02/01/2021: ALT 35   Recent Lipid Panel Lab Results  Component Value Date/Time   CHOL 128 02/01/2021 09:39 AM   CHOL 126 11/13/2012 08:41 AM   TRIG 62 02/01/2021 09:39 AM   TRIG 68 06/15/2013 08:56 AM   TRIG 112 11/13/2012 08:41 AM   HDL 53 02/01/2021 09:39 AM   HDL 61 06/15/2013 08:56 AM   HDL 46 11/13/2012 08:41 AM   CHOLHDL 2.4 02/01/2021 09:39 AM   CHOLHDL 3 02/26/2014 07:56 AM   LDLCALC 62 02/01/2021 09:39 AM   LDLCALC 78 06/15/2013 08:56 AM   LDLCALC 58 11/13/2012 08:41 AM    Wt Readings from Last 3 Encounters:  12/05/21 270 lb (122.5 kg)  11/23/21 260 lb (117.9 kg)  11/07/21 266 lb 9.6 oz (120.9 kg)     Risk Assessment/Calculations:          Objective:    Vital Signs:  BP 114/60   Pulse 74   Ht 5\' 10"  (1.778 m)   Wt 270 lb (122.5 kg)   SpO2 96%   BMI 38.74 kg/m    GEN: Well nourished, well developed in no acute distress HEENT: Normal NECK: No JVD; No carotid bruits LYMPHATICS: No lymphadenopathy CARDIAC:RRR, no  rubs, gallops.  2/6 SM at RUSB RESPIRATORY:  Clear to auscultation without rales, wheezing or rhonchi  ABDOMEN: Soft, non-tender, non-distended MUSCULOSKELETAL:  No edema; No deformity  SKIN: Warm and dry NEUROLOGIC:   Alert and oriented x 3 PSYCHIATRIC:  Normal affect   ASSESSMENT & PLAN:    OSA - The patient is tolerating PAP therapy well without any problems. The PAP download performed by his DME was personally reviewed and interpreted by me today and showed an AHI of 1.5 /hr  on 14 cm H2O with 77 % compliance in using more than 4 hours nightly.  The patient has been using and benefiting from PAP use and Julion continue to benefit from therapy.     Hypertension -BP is well controlled on exam today -Continue prescription drug management lisinopril HCT 20-12.5 mg daily with as needed refills  Medication Adjustments/Labs and Tests Ordered: Current medicines are reviewed at length with the patient today.  Concerns regarding medicines are outlined above.   Tests Ordered: No orders of the defined types were placed in this encounter.   Medication Changes: No orders of the defined types were placed in this encounter.   Follow Up: 3 months  Signed, Armanda Magic, MD  12/05/2021 2:12 PM    [facility]

## 2021-12-05 NOTE — Telephone Encounter (Signed)
Patient wants to stay with Dr. Mayford Knife for sleep. Scheduled to see her today at 1:40pm

## 2021-12-05 NOTE — Progress Notes (Signed)
Cardiology Office Note:    Date:  12/07/2021   ID:  Steven Mckee, DOB 01/01/1951, MRN [id number]  PCP:  [name], MD  Cardiologist:  Sinclair Grooms, MD   Referring MD: [name], MD   Chief Complaint  Patient presents with   Coronary Artery Disease   Hyperlipidemia   Hypertension   Hospitalization Follow-up    First-degree AV block    History of Present Illness:    Steven Mckee is a 71 y.o. male with a hx of CAD with chronic right coronary occlusion, mild to moderate mitral regurgitation, primary hypertension, diabetes mellitus type 2, hyperlipidemia, moderate obesity, and erectile dysfunction.  He is doing well.  He has been limited by orthopedic issues including plantar fasciitis.  He denies orthopnea, lower extremity swelling, and other symptoms of volume overload.  No episodes of angina.  No medication side effects.  Past Medical History:  Diagnosis Date   Angio-edema    CAD (coronary artery disease)    Diabetes mellitus without complication ([place])    type 2   HTN (hypertension) 10/28/2012   Hypercholesteremia    Low testosterone    Metabolic syndrome    Mitral valve regurgitation    Myocardial infarction ([place])    Obesity    OSA on CPAP     Past Surgical History:  Procedure Laterality Date   COLONOSCOPY     SKIN SURGERY     Mohs   VENTRAL HERNIA REPAIR N/A 01/11/2020   Procedure: OPEN REPAIR VENTRAL HERNIA WITH MESH PATCH;  Surgeon: Armandina Gemma, MD;  Location: [facility];  Service: General;  Laterality: N/A;  LMA    Current Medications: Current Meds  Medication Sig   aspirin EC 81 MG tablet Take 1 tablet (81 mg total) by mouth daily.   fexofenadine-pseudoephedrine (ALLEGRA-D 24) 180-240 MG 24 hr tablet Take 1 tablet by mouth daily.   fluocinonide (LIDEX) 0.05 % external solution daily at 6 (six) AM.   fluticasone (FLONASE) 50 MCG/ACT nasal spray Place 1 spray into both nostrils at bedtime.   halobetasol (ULTRAVATE) 0.05 % ointment Apply  1 application topically daily as needed. (contact dermatitis)   ibuprofen (ADVIL) 200 MG tablet Take 200-400 mg by mouth daily as needed for headache or moderate pain.   lisinopril-hydrochlorothiazide (ZESTORETIC) 20-12.5 MG tablet Take 1 tablet by mouth daily.   metFORMIN (GLUCOPHAGE) 500 MG tablet Take 500 mg by mouth daily with breakfast.   rosuvastatin (CRESTOR) 10 MG tablet Take 1 tablet (10 mg total) by mouth daily.   sodium chloride (OCEAN) 0.65 % SOLN nasal spray Place 2 sprays into both nostrils in the morning and at bedtime.     Allergies:   Patient has no known allergies.   Social History   Socioeconomic History   Marital status: Married    Spouse name: Not on file   Number of children: Not on file   Years of education: Not on file   Highest education level: Not on file  Occupational History   Not on file  Tobacco Use   Smoking status: Former    Packs/day: 1.00    Years: 35.00    Total pack years: 35.00    Types: Cigarettes    Start date: 5    Quit date: 03/05/2009    Years since quitting: 12.7   Smokeless tobacco: Never  Vaping Use   Vaping Use: Never used  Substance and Sexual Activity   Alcohol use: Not Currently    Alcohol/week:  0.0 standard drinks of alcohol    Comment: Occasional   Drug use: Never   Sexual activity: Never  Other Topics Concern   Not on file  Social History Narrative   Not on file   Social Determinants of Health   Financial Resource Strain: Not on file  Food Insecurity: Not on file  Transportation Needs: Not on file  Physical Activity: Not on file  Stress: Not on file  Social Connections: Not on file     Family History: The patient's family history includes Allergic rhinitis in his mother and sister; Dementia in his mother; Heart disease in his father; Stroke in his father. There is no history of Asthma, Eczema, or Urticaria.  ROS:   Please see the history of present illness.    Plantar fasciitis limiting physical activity.   All other systems reviewed and are negative.  EKGs/Labs/Other Studies Reviewed:    The following studies were reviewed today:  2 D Doppler ECHOCARDIOGRAM 2022: IMPRESSIONS   1. Left ventricular ejection fraction, by estimation, is 60 to 65%. The  left ventricle has normal function. The left ventricle has no regional  wall motion abnormalities. There is mild left ventricular hypertrophy.  Left ventricular diastolic parameters  are consistent with Grade I diastolic dysfunction (impaired relaxation).   2. Right ventricular systolic function is normal. The right ventricular  size is normal. Tricuspid regurgitation signal is inadequate for assessing  PA pressure.   3. The mitral valve is abnormal. 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   4. The aortic valve is tricuspid. There is mild calcification of the  aortic valve. Aortic valve regurgitation is not visualized. Mild aortic  valve sclerosis is present, with no evidence of aortic valve stenosis.   5. The inferior vena cava is normal in size with greater than 50%  respiratory variability, suggesting right atrial pressure of 3 mmHg.   Comparison(s): Compared to prior echo in 2015, there again is prolapse of  the posterior mitral valve leaflet with at least mild anteriorly directed  MR.   EKG:  EKG normal sinus rhythm with prolonged PR interval/first-degree AV block 380 ms.  Left atrial abnormality.  Small inferior Q waves.  When compared to the prior tracing the first-degree AV block is more severe with PR increasing from 356 ms.  Recent Labs: 02/01/2021: ALT 35  Recent Lipid Panel    Component Value Date/Time   CHOL 128 02/01/2021 0939   CHOL 126 11/13/2012 0841   TRIG 62 02/01/2021 0939   TRIG 68 06/15/2013 0856   TRIG 112 11/13/2012 0841   HDL 53 02/01/2021 0939   HDL 61 06/15/2013 0856   HDL 46 11/13/2012  0841   CHOLHDL 2.4 02/01/2021 0939   CHOLHDL 3 02/26/2014 0756   VLDL 21.0 02/26/2014 0756   LDLCALC 62 02/01/2021 0939   LDLCALC 78 06/15/2013 0856   LDLCALC 58 11/13/2012 0841    Physical Exam:    VS:  BP 118/68   Pulse 68   Ht '5\' 10"'$  (1.778 m)   Wt 268 lb 6.4 oz (121.7 kg)   SpO2 95%   BMI 38.51 kg/m     Wt Readings from Last 3 Encounters:  12/07/21 268 lb 6.4 oz (121.7 kg)  12/05/21 270 lb (122.5 kg)  11/23/21 260 lb (117.9 kg)     GEN: Overweight. No acute distress HEENT: Normal NECK: No JVD. LYMPHATICS:  No lymphadenopathy CARDIAC: 2/6 apical holosystolic murmur. RRR S4 gallop, or edema. VASCULAR:  Normal Pulses. No bruits. RESPIRATORY:  Clear to auscultation without rales, wheezing or rhonchi  ABDOMEN: Soft, non-tender, non-distended, No pulsatile mass, MUSCULOSKELETAL: No deformity  SKIN: Warm and dry NEUROLOGIC:  Alert and oriented x 3 PSYCHIATRIC:  Normal affect   ASSESSMENT:    1. Coronary artery disease involving native coronary artery of native heart with angina pectoris ([place])   2. Mitral valve prolapse   3. Primary hypertension   4. OSA (obstructive sleep apnea)   5. Mixed hyperlipidemia   6. Type 2 diabetes mellitus with complication, with long-term current use of insulin ([place])   7. First degree AV block    PLAN:    In order of problems listed above:  Secondary prevention reviewed.  Needs increase aerobic activity. Mitral regurgitation stable on echo from 1 year ago.  Does not need to be repeated this year.  Severely is graded at mild. Well-controlled on Zestoretic. CPAP is being used.   Continue Crestor 10 mg/day. Being managed by primary We discussed the first-degree AV block and also relevant information that would have to do with transient slowing of heart rate near syncope etc.  This needs to be clinically monitored over time.  Not an action item at this time.   Medication Adjustments/Labs and Tests Ordered: Current medicines are  reviewed at length with the patient today.  Concerns regarding medicines are outlined above.  Orders Placed This Encounter  Procedures   EKG 12-Lead   No orders of the defined types were placed in this encounter.   Patient Instructions  Medication Instructions:  Your physician recommends that you continue on your current medications as directed. Please refer to the Current Medication list given to you today.  *If you need a refill on your cardiac medications before your next appointment, please call your pharmacy*  Lab Work: NONE  Testing/Procedures: NONE  Follow-Up: At [facility], you and your health needs are our priority.  As part of our continuing mission to provide you with exceptional heart care, we have created designated Provider Care Teams.  These Care Teams include your primary Cardiologist (physician) and Advanced Practice Providers (APPs -  Physician Assistants and Nurse Practitioners) who all work together to provide you with the care you need, when you need it.  Your next appointment:   1 year(s)  The format for your next appointment:   In Person  Provider:   Sinclair Grooms, MD {   Important Information About Sugar         Signed, Sinclair Grooms, MD  12/07/2021 2:44 PM    [place]

## 2021-12-07 ENCOUNTER — Ambulatory Visit: Payer: Medicare Other | Admitting: Interventional Cardiology

## 2021-12-07 ENCOUNTER — Encounter: Payer: Self-pay | Admitting: Interventional Cardiology

## 2021-12-07 VITALS — BP 118/68 | HR 68 | Ht 70.0 in | Wt 268.4 lb

## 2021-12-07 DIAGNOSIS — I341 Nonrheumatic mitral (valve) prolapse: Secondary | ICD-10-CM | POA: Diagnosis not present

## 2021-12-07 DIAGNOSIS — I440 Atrioventricular block, first degree: Secondary | ICD-10-CM | POA: Diagnosis not present

## 2021-12-07 DIAGNOSIS — Z794 Long term (current) use of insulin: Secondary | ICD-10-CM

## 2021-12-07 DIAGNOSIS — I10 Essential (primary) hypertension: Secondary | ICD-10-CM | POA: Diagnosis not present

## 2021-12-07 DIAGNOSIS — E118 Type 2 diabetes mellitus with unspecified complications: Secondary | ICD-10-CM

## 2021-12-07 DIAGNOSIS — I340 Nonrheumatic mitral (valve) insufficiency: Secondary | ICD-10-CM

## 2021-12-07 DIAGNOSIS — E782 Mixed hyperlipidemia: Secondary | ICD-10-CM | POA: Diagnosis not present

## 2021-12-07 DIAGNOSIS — G4733 Obstructive sleep apnea (adult) (pediatric): Secondary | ICD-10-CM | POA: Diagnosis not present

## 2021-12-07 DIAGNOSIS — I25119 Atherosclerotic heart disease of native coronary artery with unspecified angina pectoris: Secondary | ICD-10-CM

## 2021-12-07 NOTE — Patient Instructions (Signed)

## 2021-12-23 ENCOUNTER — Other Ambulatory Visit: Payer: Self-pay | Admitting: Interventional Cardiology

## 2021-12-23 DIAGNOSIS — G4733 Obstructive sleep apnea (adult) (pediatric): Secondary | ICD-10-CM | POA: Diagnosis not present

## 2021-12-26 ENCOUNTER — Ambulatory Visit (INDEPENDENT_AMBULATORY_CARE_PROVIDER_SITE_OTHER): Payer: Medicare Other | Admitting: Internal Medicine

## 2021-12-26 ENCOUNTER — Encounter: Payer: Self-pay | Admitting: Interventional Cardiology

## 2021-12-26 ENCOUNTER — Ambulatory Visit: Payer: Medicare Other | Admitting: Internal Medicine

## 2021-12-26 ENCOUNTER — Encounter: Payer: Self-pay | Admitting: Internal Medicine

## 2021-12-26 VITALS — BP 126/68 | HR 77 | Temp 98.5°F | Ht 70.0 in | Wt 266.4 lb

## 2021-12-26 DIAGNOSIS — J849 Interstitial pulmonary disease, unspecified: Secondary | ICD-10-CM

## 2021-12-26 DIAGNOSIS — J439 Emphysema, unspecified: Secondary | ICD-10-CM

## 2021-12-26 DIAGNOSIS — R748 Abnormal levels of other serum enzymes: Secondary | ICD-10-CM

## 2021-12-26 DIAGNOSIS — Z87891 Personal history of nicotine dependence: Secondary | ICD-10-CM

## 2021-12-26 DIAGNOSIS — R918 Other nonspecific abnormal finding of lung field: Secondary | ICD-10-CM

## 2021-12-26 NOTE — Patient Instructions (Addendum)
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  I would call this is subclinical because its not reflected in your symptoms or exam or your breathing test or walking desaturation test  -The basis for this is not known.  But broadly speaking it could be IPF [based on age, ethnicity and male gender] versus non-- IPF.  IPF tends to be progressive over a few or several years while non-- IPF tends to be progressive only occasionally  Plan  - Ideally you need surgical lung biopsy or bronchoscopy method of biopsy to differentiate between the 2 -However okay to watch the situation in the short run  -Would need you to complete the ILD questionnaire in order to narrow the differences between the 2  -Please take at home with you and bring it back at the next visit -Do spirometry and DLCO in 3 months  elevated CK  -Noted recently.  Most likely due to statin  Plan  - Please talk to your cardiologist about this  Follow-up  -3 months but after breathing test and bring the ILD question packet with you.  Can bring your wife along for the visit

## 2021-12-26 NOTE — Progress Notes (Signed)
Full PFT Performed Today  

## 2021-12-26 NOTE — Progress Notes (Signed)
OV 08/08/2021  Subjective:  Patient ID: Steven Mckee, male , DOB: 07-30-1950 , age 71 y.o. , MRN: [id number] , ADDRESS: [place] [id number] PCP [name], MD Patient Care Team: [name], MD as PCP - General (Family Medicine) Belva Crome, MD as PCP - Cardiology (Cardiology)  This Provider for this visit: Treatment Team:  Attending Provider: Brand Males, MD    08/08/2021 -   Chief Complaint  Patient presents with   Consult    Pt states he has his annual CT done due to his smoking history. States that he does not have any complaints.     HPI Paulanthony Kamel 71 y.o. -former smoker who quit smoking after his wife passed away from lung cancer.  He has had 2 annual low-dose CT scans of the chest.  Both with similar results.  The most recent one in February 2023 that I personally visualized and showed him shows ILD changes.  He also has some emphysema.  However he feels fine.  He says that he works out 45 minutes cardiovascular 5 times a week.  At the end of this he would have mild shortness of breath that he feels is normal.  He can also climb 15 steps and a flight of stairs with load and only stop at the very end and feel mild shortness of breath that all improves with rest.  He has been referred here because of these findings.  Otherwise he is completely asymptomatic.  No cough.  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  He does get exposed to the sawdust from this.  But this only in recent times.  He is got a new diagnosis of sleep apnea and is going to start CPAP with oxygen at night..  It appears that overall he might prefer a conservative line of approach in his management but he is willing to go through work-up.  Of note when he was a child he had recurrent  respiratory infections with green sputum but never hospitalized for this.  This would self resolve    CT Chest data - LDCT 07/18/21  Narrative & Impression  CLINICAL DATA:  71 year old male former smoker (quit in 2010) with 45 pack-year history of smoking. Lung cancer screening examination.   EXAM: CT CHEST WITHOUT CONTRAST LOW-DOSE FOR LUNG CANCER SCREENING   TECHNIQUE: Multidetector CT imaging of the chest was performed following the standard protocol without IV contrast.   RADIATION DOSE REDUCTION: This exam was performed according to the departmental dose-optimization program which includes automated exposure control, adjustment of the mA and/or kV according to patient size and/or use of iterative reconstruction technique.   COMPARISON:  Low-dose lung cancer screening chest CT 07/18/2020.   FINDINGS: Cardiovascular: Heart size is normal. There is no significant pericardial fluid, thickening or pericardial calcification. There is aortic atherosclerosis, as well as atherosclerosis of the great vessels of the mediastinum and the coronary arteries, including calcified atherosclerotic plaque in the left main, left anterior descending, left circumflex and right coronary arteries.   Mediastinum/Nodes: No pathologically enlarged mediastinal or hilar lymph nodes. Esophagus is unremarkable in appearance. No axillary lymphadenopathy.   Lungs/Pleura: No suspicious appearing pulmonary nodules or masses are noted. No acute consolidative airspace disease. No pleural  effusions. Widespread but patchy areas of ground-glass attenuation, septal thickening, thickening of the peribronchovascular interstitium, regional architectural distortion and mild cylindrical bronchiectasis are noted, most evident throughout the mid to lower lungs, similar to the prior study.   Upper Abdomen: Aortic atherosclerosis. 13 mm calcified gallstone in the gallbladder. Diffuse low attenuation throughout the  visualized hepatic parenchyma, indicative of a background of severe hepatic steatosis.   Musculoskeletal: There are no aggressive appearing lytic or blastic lesions noted in the visualized portions of the skeleton.   IMPRESSION: 1. Lung-RADS 1S, negative. Continue annual screening with low-dose chest CT without contrast in 12 months. 2. The "S" modifier above refers to potentially clinically significant non lung cancer related findings. Specifically, the appearance of the lungs suggests interstitial lung disease, with a spectrum of findings considered probable usual interstitial pneumonia (UIP) per current ATS guidelines. Referral to Pulmonology for further clinical evaluation is recommended. 3. There is also mild diffuse bronchial wall thickening with mild centrilobular and paraseptal emphysema; imaging findings suggestive of underlying COPD. 4. Aortic atherosclerosis, in addition to left main and three-vessel coronary artery disease. Please note that although the presence of coronary artery calcium documents the presence of coronary artery disease, the severity of this disease and any potential stenosis cannot be assessed on this non-gated CT examination. Assessment for potential risk factor modification, dietary therapy or pharmacologic therapy may be warranted, if clinically indicated.   Aortic Atherosclerosis (ICD10-I70.0) and Emphysema (ICD10-J43.9).     Electronically Signed   By: Vinnie Langton M.D.   On: 07/19/2021 06:44    No results found.    PFT  No flowsheet data found.    OV 12/26/2021  Subjective:  Patient ID: Steven Mckee, male , DOB: 14-Feb-1951 , age 34 y.o. , MRN: [id number] , ADDRESS: [place] [address] PCP [name], MD (Inactive) Patient Care Team: [name], MD (Inactive) as PCP - General (Family Medicine) Belva Crome, MD as PCP - Cardiology (Cardiology) Sueanne Margarita, MD as PCP - Sleep Medicine  (Cardiology)  This Provider for this visit: Treatment Team:  Attending Provider: Brand Males, MD    12/26/2021 -   Chief Complaint  Patient presents with   Follow-up    Follow up for abnormal ct scan. Pt had full pfts done this visit. No issues noted with breathing noted from patient.    Follow-up ILD work-up in progress Has obesity and sleep apnea.  HPI Darron Gerdeman 71 y.o. -presents following his ILD work-up.  Some months of past.  I thought I given him an ILD questionnaire at the last visit but he does not recall this.  So therefore we do not have the information from the ILD exposure questionnaire.  Nevertheless overall he feels stable.  His symptom scores are very minimal.  He states that he only gets short of breath when he climbs 50 stairs with load and even for that he is level 1 and he has to rest for 30 seconds.  He had autoimmune profile was negative except for slightly elevated CK.  He is noted to be on statin.  He had pulmonary function test his FVC and total lung capacity are diminished but then he is got severe obesity his DLCO is normal.  His walking desaturation test today is normal.  He had high-resolution CT chest: Reported as fluctuating between probable UIP and also air trapping.  I personally saw air-trapping.  I would call this is not consistent with UIP and suggestive  of alternative diagnosis/indeterminate.   His entry medical history is that he states his conduction abnormalities might suggest that in the future he might need pacemaker according to Dr. Pernell Dupre.    SYMPTOM SCALE - ILD 12/26/2021  Current weight   O2 use ra  Shortness of Breath 0 -> 5 scale with 5 being worst (score 6 If unable to do)  At rest 0  Simple tasks - showers, clothes change, eating, shaving 0  Household (dishes, doing bed, laundry) 0  Shopping 0  Walking level at own pace 0  Walking up Stairs 1  Total (30-36) Dyspnea Score 0  How bad is your cough? 00  How bad is your  fatigue 0  How bad is nausea 0  How bad is vomiting?  0  How bad is diarrhea? 0  How bad is anxiety? 0  How bad is depression 0  Any chronic pain - if so where and how bad 0        Latest Reference Range & Units 08/08/21 11:32  CK Total 44 - 196 U/L 268 (H)  CK, MB 0 - 5.0 ng/mL 2.6  Aldolase < OR = 8.1 U/L 5.0  A-1 Antitrypsin, Ser 83 - 199 mg/dL 151  ALPHA-1 ANTITRYPSIN PHENOTYPE  Rpt  Sed Rate 0 - 20 mm/hr 5  Anti Nuclear Antibody (ANA) NEGATIVE  NEGATIVE  Cyclic Citrullin Peptide Ab UNITS <16  ds DNA Ab IU/mL 2  RA Latex Turbid. <14 IU/mL <14  SSA (Ro) (ENA) Antibody, IgG <1.0 NEG AI <1.0 NEG  SSB (La) (ENA) Antibody, IgG <1.0 NEG AI <1.0 NEG  Scleroderma (Scl-70) (ENA) Antibody, IgG <1.0 NEG AI <1.0 NEG  QUANTIFERON-TB GOLD PLUS  Rpt  A.Fumigatus #1 Abs Negative  Negative  Micropolyspora faeni, IgG Negative  Negative  Thermoactinomyces vulgaris, IgG Negative  Negative  A. Pullulans Abs Negative  Negative  Thermoact. Saccharii Negative  Negative  Pigeon Serum Abs Negative  Negative  Mitogen-NIL IU/mL >10.00  ALPHA-1-ANTITRYPSIN (AAT) PHENOTYPE  SEE NOTE  NIL IU/mL 0.01  TB1-NIL IU/mL 0.00  TB2-NIL IU/mL 0.00  (H): Data is abnormally high Rpt: View report in Results Review for more information   HRCT March 2023  Narrative & Impression  CLINICAL DATA:  71 year old male with history of abnormal appearance of the lungs on prior low-dose lung cancer screening chest CT. Follow-up study. Evaluate for interstitial lung disease.   EXAM: CT CHEST WITHOUT CONTRAST   TECHNIQUE: Multidetector CT imaging of the chest was performed following the standard protocol without intravenous contrast. High resolution imaging of the lungs, as well as inspiratory and expiratory imaging, was performed.   RADIATION DOSE REDUCTION: This exam was performed according to the departmental dose-optimization program which includes automated exposure control, adjustment of the mA and/or  kV according to patient size and/or use of iterative reconstruction technique.   COMPARISON:  Low-dose lung cancer screening chest CT 07/18/2021.   FINDINGS: Cardiovascular: Heart size is normal. There is no significant pericardial fluid, thickening or pericardial calcification. There is aortic atherosclerosis, as well as atherosclerosis of the great vessels of the mediastinum and the coronary arteries, including calcified atherosclerotic plaque in the left main, left anterior descending, left circumflex and right coronary arteries.   Mediastinum/Nodes: Mediastinal or no pathologically enlarged hilar lymph nodes. Please note that accurate exclusion of hilar adenopathy is limited on noncontrast CT scans. Esophagus is unremarkable in appearance. No axillary lymphadenopathy.   Lungs/Pleura: High-resolution images demonstrate patchy areas of ground-glass attenuation, septal thickening, mild  cylindrical bronchiectasis, peripheral bronchiolectasis, thickening of the peribronchovascular interstitium and regional areas of architectural distortion. These findings have a definitive craniocaudal gradient and appear similar to the recent prior examination. No frank honeycombing. Inspiratory and expiratory imaging demonstrates some mild air trapping indicative of mild small airways disease. No acute consolidative airspace disease. No pleural effusions. No definite suspicious appearing pulmonary nodules or masses are noted. Mild centrilobular and paraseptal emphysema.   Upper Abdomen: Aortic atherosclerosis. Calcified gallstone in the neck of the gallbladder measuring 1.4 cm. Gallbladder does not appear distended. Diffuse low attenuation throughout the visualized hepatic parenchyma, indicative of a background of hepatic steatosis.   Musculoskeletal: There are no aggressive appearing lytic or blastic lesions noted in the visualized portions of the skeleton.   IMPRESSION: 1. The appearance of  the lungs is compatible with interstitial lung disease, with a spectrum of findings considered probable usual interstitial pneumonia (UIP) per current ATS guidelines. However, given the presence of air trapping, and relative stability compared to prior examinations dating back to 08/07/2020, the possibility of fibrotic phase nonspecific interstitial pneumonia (NSIP) warrants consideration. Repeat high-resolution chest CT is recommended in 12 months to assess for temporal changes in the appearance of the lung parenchyma. 2. Aortic atherosclerosis, in addition to left main and three-vessel coronary artery disease. Assessment for potential risk factor modification, dietary therapy or pharmacologic therapy may be warranted, if clinically indicated. 3. Mild diffuse bronchial wall thickening with mild centrilobular and paraseptal emphysema; imaging findings suggestive of underlying COPD. 4. Cholelithiasis without evidence of acute cholecystitis. 5. Hepatic steatosis.   Aortic Atherosclerosis (ICD10-I70.0).     Electronically Signed   By: Vinnie Langton M.D.   On: 09/05/2021 09:20        Latest Ref Rng & Units 12/26/2021    8:58 AM  PFT Results  FVC-Pre L 3.24  P  FVC-Predicted Pre % 73  P  FVC-Post L 3.21  P  FVC-Predicted Post % 72  P  Pre FEV1/FVC % % 87  P  Post FEV1/FCV % % 90  P  FEV1-Pre L 2.83  P  FEV1-Predicted Pre % 86  P  FEV1-Post L 2.89  P  DLCO uncorrected ml/min/mmHg 21.52  P  DLCO UNC% % 83  P  DLCO corrected ml/min/mmHg 21.52  P  DLCO COR %Predicted % 83  P  DLVA Predicted % 108  P  TLC L 4.67  P  TLC % Predicted % 66  P  RV % Predicted % 54  P    P Preliminary result   Simple office walk 185 feet x  3 laps goal with forehead probe 12/26/2021    O2 used ra   Number laps completed 3   Comments about pace Slow stedad   Resting Pulse Ox/HR 98% and 78/min   Final Pulse Ox/HR 97% and 89/min   Desaturated </= 88% no   Desaturated <= 3% points no   Got  Tachycardic >/= 90/min no   Symptoms at end of test none   Miscellaneous comments x       has a past medical history of Angio-edema, CAD (coronary artery disease), Diabetes mellitus without complication ([place]), HTN (hypertension) (10/28/2012), Hypercholesteremia, Low testosterone, Metabolic syndrome, Mitral valve regurgitation, Myocardial infarction ([place]), Obesity, and OSA on CPAP.   reports that he quit smoking about 12 years ago. His smoking use included cigarettes. He started smoking about 53 years ago. He has a 35.00 pack-year smoking history. He has never used smokeless tobacco.  Past  Surgical History:  Procedure Laterality Date   COLONOSCOPY     SKIN SURGERY     Mohs   VENTRAL HERNIA REPAIR N/A 01/11/2020   Procedure: OPEN REPAIR VENTRAL HERNIA WITH MESH PATCH;  Surgeon: Armandina Gemma, MD;  Location: [facility];  Service: General;  Laterality: N/A;  LMA    No Known Allergies  Immunization History  Administered Date(s) Administered   Influenza-Unspecified 03/04/2013   PFIZER(Purple Top)SARS-COV-2 Vaccination 07/04/2019, 07/26/2019, 04/01/2020    Family History  Problem Relation Age of Onset   Dementia Mother    Allergic rhinitis Mother    Heart disease Father    Stroke Father    Allergic rhinitis Sister    Asthma Neg Hx    Eczema Neg Hx    Urticaria Neg Hx      Current Outpatient Medications:    aspirin EC 81 MG tablet, Take 1 tablet (81 mg total) by mouth daily., Disp: 90 tablet, Rfl: 3   fexofenadine-pseudoephedrine (ALLEGRA-D 24) 180-240 MG 24 hr tablet, Take 1 tablet by mouth daily., Disp: , Rfl:    fluocinonide (LIDEX) 0.05 % external solution, daily at 6 (six) AM., Disp: , Rfl:    halobetasol (ULTRAVATE) 0.05 % ointment, Apply 1 application topically daily as needed. (contact dermatitis), Disp: , Rfl:    ibuprofen (ADVIL) 200 MG tablet, Take 200-400 mg by mouth daily as needed for headache or moderate pain., Disp: , Rfl:    lisinopril-hydrochlorothiazide (ZESTORETIC)  20-12.5 MG tablet, TAKE 1 TABLET BY MOUTH DAILY, Disp: 90 tablet, Rfl: 3   metFORMIN (GLUCOPHAGE) 500 MG tablet, Take 500 mg by mouth daily with breakfast., Disp: , Rfl:    rosuvastatin (CRESTOR) 10 MG tablet, Take 1 tablet (10 mg total) by mouth daily., Disp: 90 tablet, Rfl: 3   sodium chloride (OCEAN) 0.65 % SOLN nasal spray, Place 2 sprays into both nostrils in the morning and at bedtime., Disp: , Rfl: 0 No current facility-administered medications for this visit.  Facility-Administered Medications Ordered in Other Visits:    regadenoson (LEXISCAN) injection SOLN 0.4 mg, 0.4 mg, Intravenous, Once, Nahser, Wonda Cheng, MD      Objective:   Vitals:   12/26/21 1510  BP: 126/68  Pulse: 77  Temp: 98.5 F (36.9 C)  TempSrc: Oral  SpO2: 97%  Weight: 266 lb 6.4 oz (120.8 kg)  Height: '5\' 10"'$  (1.778 m)    Estimated body mass index is 38.22 kg/m as calculated from the following:   Height as of this encounter: '5\' 10"'$  (1.778 m).   Weight as of this encounter: 266 lb 6.4 oz (120.8 kg).  '@WEIGHTCHANGE'$ @  [employer]   12/26/21 1510  Weight: 266 lb 6.4 oz (120.8 kg)     Physical Exam obes   General: No distress. Obese., looks well Neuro: Alert and Oriented x 3. GCS 15. Speech normal Psych: Pleasant Resp:  Barrel Chest - no.  Wheeze - no, Crackles - no, No overt respiratory distress CVS: Normal heart sounds. Murmurs - no Ext: Stigmata of Connective Tissue Disease - no HEENT: Normal upper airway. PEERL +. No post nasal drip        Assessment:       ICD-10-CM   1. ILD (interstitial lung disease) ([place])  J84.9     2. Former smoker  Z87.891     3. Elevated CK  R74.8          Plan:     Patient Instructions  ILD (interstitial lung disease) ([place]) Former smoker  -  You have very mild interstitial lung disease.  I would call this is subclinical because its not reflected in your symptoms or exam or your breathing test or walking desaturation test  -The basis for this is  not known.  But broadly speaking it could be IPF [based on age, ethnicity and male gender] versus non-- IPF.  IPF tends to be progressive over a few or several years while non-- IPF tends to be progressive only occasionally  Plan  - Ideally you need surgical lung biopsy or bronchoscopy method of biopsy to differentiate between the 2 -However okay to watch the situation in the short run  -Would need you to complete the ILD questionnaire in order to narrow the differences between the 2  -Please take at home with you and bring it back at the next visit -Do spirometry and DLCO in 3 months  elevated CK  -Noted recently.  Most likely due to statin  Plan  - Please talk to your cardiologist about this  Follow-up  -3 months but after breathing test and bring the ILD question packet with you.  Can bring your wife along for the visit  ( Level 05 visit: Estb 40-54 min visit type: on-site physical face to visit  in total care time and counseling or/and coordination of care by this undersigned MD - Dr Brand Males. This includes one or more of the following on this same day 12/26/2021: pre-charting, chart review, note writing, documentation discussion of test results, diagnostic or treatment recommendations, prognosis, risks and benefits of management options, instructions, education, compliance or risk-factor reduction. It excludes time spent by the [facility] or office staff in the care of the patient. Actual time 40 min)   SIGNATURE    Dr. Brand Males, M.D., F.C.C.P,  Pulmonary and Critical Care Medicine Staff Physician, [place] Director - Interstitial Lung Disease  Program  Pulmonary [facility] [place], [place], [address]  Pager: [phone], If no answer or between  15:00h - 7:00h: call [phone] Telephone: [phone]  4:14 PM 12/26/2021

## 2021-12-26 NOTE — Patient Instructions (Signed)
Full PFT Performed Today  

## 2021-12-27 DIAGNOSIS — M7661 Achilles tendinitis, right leg: Secondary | ICD-10-CM | POA: Diagnosis not present

## 2021-12-27 DIAGNOSIS — M722 Plantar fascial fibromatosis: Secondary | ICD-10-CM | POA: Diagnosis not present

## 2022-01-01 ENCOUNTER — Other Ambulatory Visit: Payer: Self-pay

## 2022-01-01 MED ORDER — ROSUVASTATIN CALCIUM 10 MG PO TABS: 10.0000 mg | ORAL_TABLET | Freq: Every day | ORAL | 3 refills | Status: DC

## 2022-01-04 LAB — PULMONARY FUNCTION TEST
DL/VA % pred: 108 %
DL/VA: 4.37 ml/min/mmHg/L
DLCO cor % pred: 83 %
DLCO cor: 21.52 ml/min/mmHg
DLCO unc % pred: 83 %
DLCO unc: 21.52 ml/min/mmHg
FEF 25-75 Post: 4.47 L/sec
FEF 25-75 Pre: 3.9 L/sec
FEF2575-%Change-Post: 14 %
FEF2575-%Pred-Post: 180 %
FEF2575-%Pred-Pre: 157 %
FEV1-%Change-Post: 2 %
FEV1-%Pred-Post: 88 %
FEV1-%Pred-Pre: 86 %
FEV1-Post: 2.89 L
FEV1-Pre: 2.83 L
FEV1FVC-%Change-Post: 2 %
FEV1FVC-%Pred-Pre: 118 %
FEV6-%Change-Post: 0 %
FEV6-%Pred-Post: 76 %
FEV6-%Pred-Pre: 77 %
FEV6-Post: 3.21 L
FEV6-Pre: 3.24 L
FEV6FVC-%Pred-Post: 105 %
FEV6FVC-%Pred-Pre: 105 %
FVC-%Change-Post: 0 %
FVC-%Pred-Post: 72 %
FVC-%Pred-Pre: 73 %
FVC-Post: 3.21 L
FVC-Pre: 3.24 L
Post FEV1/FVC ratio: 90 %
Post FEV6/FVC ratio: 100 %
Pre FEV1/FVC ratio: 87 %
Pre FEV6/FVC Ratio: 100 %
RV % pred: 54 %
RV: 1.33 L
TLC % pred: 66 %
TLC: 4.67 L

## 2022-02-09 ENCOUNTER — Ambulatory Visit: Payer: Medicare Other | Attending: Cardiology | Admitting: Cardiology

## 2022-02-09 ENCOUNTER — Telehealth: Payer: Self-pay | Admitting: Interventional Cardiology

## 2022-02-09 VITALS — BP 122/64 | HR 71 | Ht 70.0 in | Wt 267.6 lb

## 2022-02-09 DIAGNOSIS — I440 Atrioventricular block, first degree: Secondary | ICD-10-CM

## 2022-02-09 DIAGNOSIS — I25119 Atherosclerotic heart disease of native coronary artery with unspecified angina pectoris: Secondary | ICD-10-CM

## 2022-02-09 MED ORDER — NITROGLYCERIN 0.4 MG SL SUBL: 0.4000 mg | SUBLINGUAL_TABLET | SUBLINGUAL | 5 refills | Status: AC | PRN

## 2022-02-09 NOTE — Telephone Encounter (Signed)
Spoke w patient's wife. She said he was told by Dr. Tamala Julian to try to come in for EKG if he has this feeling of "feeling his heart" and they are in town, he should come by while he is feeling it.  I offered a nv for EKG at 1pm today and he Codey come then.  His only other c/o is that he is cold and his wife said that he describes "what I think might be mild chest pressure".  He calls it a fullness. BP and HR fine 124/65, 109/64, 68  From Dr. Thompson Caul ov 12/07/21: We discussed the first-degree AV block and also relevant information that would have to do with transient slowing of heart rate near syncope etc.  This needs to be clinically monitored over time.  Not an action item at this time.  ________________________________________________________________________________________

## 2022-02-09 NOTE — Progress Notes (Unsigned)
   Nurse Visit   Date of Encounter: 02/09/2022 ID: Steven Mckee, DOB 1951-04-09, MRN [id number]  PCP:  [name], MD (Inactive)   [facility] Providers Cardiologist:  Sinclair Grooms, MD Sleep Medicine:  Fransico Him, MD {  Visit Details   VS:  BP 122/64   Pulse 71   Ht '5\' 10"'$  (1.778 m)   Wt 267 lb 9.6 oz (121.4 kg)   SpO2 97%   BMI 38.40 kg/m  , BMI Body mass index is 38.4 kg/m.  Wt Readings from Last 3 Encounters:  02/09/22 267 lb 9.6 oz (121.4 kg)  12/26/21 266 lb 6.4 oz (120.8 kg)  12/07/21 268 lb 6.4 oz (121.7 kg)     Reason for visit: EKG for chest tightness/fullness. Performed today: Vitals, EKG, Provider consulted:Dr. Candee Furbish, and Education Changes (medications, testing, etc.) : Add nitroglycerin 0.22mg SL PRN Length of Visit: 42 minutes    Medications Adjustments/Labs and Tests Ordered: Patient came in for nurse visit for EKG due to complaints of chest tightness, "feeling my heart, it feels full." He reports this happens every year and goes away on it's own, but this lasted a day and half and was told to come in for EKG if it every occurs to be monitored in clinic. Patient denies CP or SOB. He states he has had more stress in his life recently. Patient states he took new prescription of Diclofenac today and the sensation in his chest is improving, "it's about 70% better than it was this morning." EKG obtained and reviewed by DOD Dr. SMarlou Porch EKG shows sinus rhythm with 1st degree AV block, no change from previous EKG 12/07/21. Dr. SMarlou Porchrecommended patient could try Imdur '30mg'$  QD, patient states he would like to discuss this with Dr. STamala Julian Nitroglycerin prescribed for PRN use.   Signed, KMolli Barrows RN  02/09/2022 2:03 PM

## 2022-02-09 NOTE — Patient Instructions (Signed)
Medication Instructions:  Your physician has recommended you make the following change in your medication:   1) START Nitroglycerin 0.69mg as needed for chest pain/pressure (take while sitting or lying down, dissolve 1 tablet under tongue, may repeat for another dose every 5 minutes for up to 3 doses total, if not relief after second dose call 911)  Call our office at [phone] you have any questions or concerns, call [place] an update if you do end up taking a nitroglycerin.  *If you need a refill on your cardiac medications before your next appointment, please call your pharmacy*  Lab Work: NONE  Testing/Procedures: NONE  Follow-Up: [facility] and your [facility] needs are our priority.  As part of our continuing mission to provide you with exceptional heart care, we have created designated Provider Care Teams.  These Care Teams include your primary Cardiologist (physician) and Advanced Practice Providers (APPs -  Physician Assistants and Nurse Practitioners) who all work together to provide you with the care you need, when you need it.  Your next appointment:   9 month(s)  The format for your next appointment:   In Person  Provider:   HSinclair Grooms MD    Important Information About Sugar

## 2022-02-09 NOTE — Telephone Encounter (Signed)
Pt c/o of Chest Pain: STAT if CP now or developed within 24 hours  1. Are you having CP right now? Not having chest pain, just a check tightness   2. Are you experiencing any other symptoms (ex. SOB, nausea, vomiting, sweating)? He feels cold and anxious. He feels congestion in his heart, states heart feels funny.   3. How long have you been experiencing CP? Sometime yesterday she started with chest tightness.   4. Is your CP continuous or coming and going? It's does come and go, but at the moment it's continuous.   5. Have you taken Nitroglycerin? No  Was told by Dr. Tamala Julian when this happens to come in for an EKG.  ?

## 2022-03-13 DIAGNOSIS — J300 Vasomotor rhinitis: Secondary | ICD-10-CM | POA: Diagnosis not present

## 2022-03-13 DIAGNOSIS — M25571 Pain in right ankle and joints of right foot: Secondary | ICD-10-CM | POA: Diagnosis not present

## 2022-03-13 DIAGNOSIS — M722 Plantar fascial fibromatosis: Secondary | ICD-10-CM | POA: Diagnosis not present

## 2022-03-13 DIAGNOSIS — G4733 Obstructive sleep apnea (adult) (pediatric): Secondary | ICD-10-CM | POA: Diagnosis not present

## 2022-03-23 DIAGNOSIS — G4733 Obstructive sleep apnea (adult) (pediatric): Secondary | ICD-10-CM | POA: Diagnosis not present

## 2022-03-23 DIAGNOSIS — E7800 Pure hypercholesterolemia, unspecified: Secondary | ICD-10-CM | POA: Diagnosis not present

## 2022-03-23 DIAGNOSIS — E1159 Type 2 diabetes mellitus with other circulatory complications: Secondary | ICD-10-CM | POA: Diagnosis not present

## 2022-03-23 DIAGNOSIS — I10 Essential (primary) hypertension: Secondary | ICD-10-CM | POA: Diagnosis not present

## 2022-03-25 DIAGNOSIS — G4733 Obstructive sleep apnea (adult) (pediatric): Secondary | ICD-10-CM | POA: Diagnosis not present

## 2022-03-28 DIAGNOSIS — H40013 Open angle with borderline findings, low risk, bilateral: Secondary | ICD-10-CM | POA: Diagnosis not present

## 2022-03-28 DIAGNOSIS — H02831 Dermatochalasis of right upper eyelid: Secondary | ICD-10-CM | POA: Diagnosis not present

## 2022-03-28 DIAGNOSIS — Z961 Presence of intraocular lens: Secondary | ICD-10-CM | POA: Diagnosis not present

## 2022-03-28 DIAGNOSIS — H02834 Dermatochalasis of left upper eyelid: Secondary | ICD-10-CM | POA: Diagnosis not present

## 2022-03-29 ENCOUNTER — Other Ambulatory Visit: Payer: Self-pay

## 2022-03-29 DIAGNOSIS — J849 Interstitial pulmonary disease, unspecified: Secondary | ICD-10-CM

## 2022-03-30 ENCOUNTER — Encounter: Payer: Self-pay | Admitting: Internal Medicine

## 2022-03-30 ENCOUNTER — Ambulatory Visit (INDEPENDENT_AMBULATORY_CARE_PROVIDER_SITE_OTHER): Payer: Medicare Other | Admitting: Internal Medicine

## 2022-03-30 ENCOUNTER — Ambulatory Visit: Payer: Medicare Other | Admitting: Internal Medicine

## 2022-03-30 VITALS — BP 126/62 | HR 77 | Temp 98.3°F | Ht 70.0 in | Wt 268.0 lb

## 2022-03-30 DIAGNOSIS — Z87891 Personal history of nicotine dependence: Secondary | ICD-10-CM | POA: Diagnosis not present

## 2022-03-30 DIAGNOSIS — R748 Abnormal levels of other serum enzymes: Secondary | ICD-10-CM | POA: Diagnosis not present

## 2022-03-30 DIAGNOSIS — Z23 Encounter for immunization: Secondary | ICD-10-CM

## 2022-03-30 DIAGNOSIS — J849 Interstitial pulmonary disease, unspecified: Secondary | ICD-10-CM | POA: Diagnosis not present

## 2022-03-30 DIAGNOSIS — E669 Obesity, unspecified: Secondary | ICD-10-CM

## 2022-03-30 LAB — PULMONARY FUNCTION TEST
DL/VA % pred: 112 %
DL/VA: 4.57 ml/min/mmHg/L
DLCO cor % pred: 83 %
DLCO cor: 21.63 ml/min/mmHg
DLCO unc % pred: 83 %
DLCO unc: 21.63 ml/min/mmHg
FEF 25-75 Pre: 4.19 L/sec
FEF2575-%Pred-Pre: 169 %
FEV1-%Pred-Pre: 86 %
FEV1-Pre: 2.82 L
FEV1FVC-%Pred-Pre: 119 %
FEV6-%Pred-Pre: 76 %
FEV6-Pre: 3.21 L
FEV6FVC-%Pred-Pre: 105 %
FVC-%Pred-Pre: 72 %
FVC-Pre: 3.21 L
Pre FEV1/FVC ratio: 88 %
Pre FEV6/FVC Ratio: 100 %

## 2022-03-30 NOTE — Progress Notes (Signed)
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 Physician, Painter Director - Interstitial Lung Disease  Program  [facility] [place], [place], [address]  Pager: [phone], If no answer or between  15:00h - 7:00h: call [phone] Telephone: [phone]  5:06 PM 03/30/2022

## 2022-03-30 NOTE — Patient Instructions (Signed)
Spirometry/DLCO performed today. 

## 2022-03-30 NOTE — Progress Notes (Signed)
Spirometry/DLCO performed today. 

## 2022-03-30 NOTE — Patient Instructions (Addendum)
ILD (interstitial lung disease) ([place]) Former smoker Obesity  -You have very mild interstitial lung disease.  I would call this is subclinical because its not reflected in your symptoms or exam or your breathing test or walking desaturation test  -The basis for this is not known.  But broadly speaking it could be IPF [based on age, ethnicity and male gender, saw dust and former smoking] versus non-- IPF.  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  Most likely due to statin - leg cramps could be because of this  Plan -recheck ck; if elevated might need to stop statin   Followup  -4 months - 30 min visit; but after spiro/dlco  - walk and ILD symptoms score at followup

## 2022-03-31 LAB — CK TOTAL AND CKMB (NOT AT ARMC)
CK, MB: 3.1 ng/mL (ref 0–5.0)
Relative Index: 1 (ref 0–4.0)
Total CK: 309 U/L — ABNORMAL HIGH (ref 44–196)

## 2022-04-01 ENCOUNTER — Telehealth: Payer: Self-pay | Admitting: Internal Medicine

## 2022-04-01 DIAGNOSIS — R748 Abnormal levels of other serum enzymes: Secondary | ICD-10-CM

## 2022-04-01 NOTE — Telephone Encounter (Signed)
   CK still high Also has muscle cramps/ache  Plan  - stop crestor for 1 month - 2 weeks after stopping - recheck ck and ck-mb - if this nnormalized and aches better statin is the cause  -v ery low risk of adverse event stopping statin for short period

## 2022-04-02 ENCOUNTER — Encounter: Payer: Self-pay | Admitting: Interventional Cardiology

## 2022-04-02 ENCOUNTER — Encounter: Payer: Self-pay | Admitting: Internal Medicine

## 2022-04-02 NOTE — Telephone Encounter (Signed)
Called and spoke with pt letting him know the info from MR. Pt said he was going to contact cardiologist to have them review the info from MR prior to him stopping the crestor. Order has been placed for the labwork to be repeated.

## 2022-04-11 ENCOUNTER — Ambulatory Visit: Payer: Medicare Other | Admitting: Cardiology

## 2022-04-18 ENCOUNTER — Other Ambulatory Visit: Payer: Medicare Other

## 2022-04-18 DIAGNOSIS — D225 Melanocytic nevi of trunk: Secondary | ICD-10-CM | POA: Diagnosis not present

## 2022-04-18 DIAGNOSIS — R748 Abnormal levels of other serum enzymes: Secondary | ICD-10-CM | POA: Diagnosis not present

## 2022-04-18 DIAGNOSIS — Z86018 Personal history of other benign neoplasm: Secondary | ICD-10-CM | POA: Diagnosis not present

## 2022-04-18 DIAGNOSIS — C44511 Basal cell carcinoma of skin of breast: Secondary | ICD-10-CM | POA: Diagnosis not present

## 2022-04-18 DIAGNOSIS — L409 Psoriasis, unspecified: Secondary | ICD-10-CM | POA: Diagnosis not present

## 2022-04-18 DIAGNOSIS — L570 Actinic keratosis: Secondary | ICD-10-CM | POA: Diagnosis not present

## 2022-04-18 DIAGNOSIS — D485 Neoplasm of uncertain behavior of skin: Secondary | ICD-10-CM | POA: Diagnosis not present

## 2022-04-18 DIAGNOSIS — D045 Carcinoma in situ of skin of trunk: Secondary | ICD-10-CM | POA: Diagnosis not present

## 2022-04-18 DIAGNOSIS — L821 Other seborrheic keratosis: Secondary | ICD-10-CM | POA: Diagnosis not present

## 2022-04-18 DIAGNOSIS — C44529 Squamous cell carcinoma of skin of other part of trunk: Secondary | ICD-10-CM | POA: Diagnosis not present

## 2022-04-18 DIAGNOSIS — C44722 Squamous cell carcinoma of skin of right lower limb, including hip: Secondary | ICD-10-CM | POA: Diagnosis not present

## 2022-04-18 DIAGNOSIS — L578 Other skin changes due to chronic exposure to nonionizing radiation: Secondary | ICD-10-CM | POA: Diagnosis not present

## 2022-04-18 DIAGNOSIS — D044 Carcinoma in situ of skin of scalp and neck: Secondary | ICD-10-CM | POA: Diagnosis not present

## 2022-04-18 DIAGNOSIS — L814 Other melanin hyperpigmentation: Secondary | ICD-10-CM | POA: Diagnosis not present

## 2022-04-18 DIAGNOSIS — C44519 Basal cell carcinoma of skin of other part of trunk: Secondary | ICD-10-CM | POA: Diagnosis not present

## 2022-04-18 DIAGNOSIS — L400 Psoriasis vulgaris: Secondary | ICD-10-CM | POA: Diagnosis not present

## 2022-04-19 LAB — CK TOTAL AND CKMB (NOT AT ARMC)
CK, MB: 3.6 ng/mL (ref 0–5.0)
Relative Index: 1.9 (ref 0–4.0)
Total CK: 191 U/L (ref 44–196)

## 2022-04-23 ENCOUNTER — Encounter: Payer: Self-pay | Admitting: Interventional Cardiology

## 2022-04-23 DIAGNOSIS — E782 Mixed hyperlipidemia: Secondary | ICD-10-CM

## 2022-04-23 NOTE — Telephone Encounter (Signed)
Hi CK off crestor is now normal. Please recofnirm with him that he is off crestor. If so, this means the crestor was increasing his muscle enzymes. I recommend he talk to [facility] cardiologist about another medication and also PCP about weight loss drugs and go on low carb diet to lose weight

## 2022-04-25 DIAGNOSIS — G4733 Obstructive sleep apnea (adult) (pediatric): Secondary | ICD-10-CM | POA: Diagnosis not present

## 2022-05-10 ENCOUNTER — Telehealth: Payer: Self-pay | Admitting: Pharmacist

## 2022-05-10 ENCOUNTER — Ambulatory Visit: Payer: Medicare Other | Attending: Internal Medicine | Admitting: Pharmacist

## 2022-05-10 VITALS — Wt 265.0 lb

## 2022-05-10 DIAGNOSIS — C44519 Basal cell carcinoma of skin of other part of trunk: Secondary | ICD-10-CM | POA: Diagnosis not present

## 2022-05-10 DIAGNOSIS — I25119 Atherosclerotic heart disease of native coronary artery with unspecified angina pectoris: Secondary | ICD-10-CM

## 2022-05-10 DIAGNOSIS — E782 Mixed hyperlipidemia: Secondary | ICD-10-CM

## 2022-05-10 DIAGNOSIS — C44722 Squamous cell carcinoma of skin of right lower limb, including hip: Secondary | ICD-10-CM | POA: Diagnosis not present

## 2022-05-10 NOTE — Patient Instructions (Addendum)
Your LDL cholesterol goal is < 55  Stop taking Crestor (rosuvastatin)  I'll submit information to your insurance to cover Nexlizet, a daily pill that lowers your LDL cholesterol by 40-50%  We'll recheck labs on Monday, January 8th. Come in any time after 7:30am for fasting labs  Rybelsus is a once daily pill currently approved for diabetes. It's the oral formulation of Ozempic (active ingredient is semaglutide). It's currently being studied at higher doses (25-'50mg'$  daily) that are supposed to be more effective for weight loss, but is currently only available up to '14mg'$  dosing (shows about 5% weight loss)

## 2022-05-10 NOTE — Progress Notes (Signed)
Patient ID: Steven Mckee                 DOB: 22-Aug-1950                    MRN: [id number]     HPI: Steven Mckee is a 71 y.o. male patient referred to lipid clinic by Dr Steven Mckee. PMH is significant for CAD with MI and chronic right coronary occlusion, mild to moderate mitral regurgitation, HTN, HLD, preDM, obesity, OSA, and ED. Follows with pulmonology for interstitial lung disease and has had autoimmune profiles including CK lab checks. CK was elevated at 309 on 03/30/22. Pt also reported muscle cramps and aches. He was advised to hold his Crestor and recheck CK 2 weeks after statin hold. Follow up CK improved to 191 off of his Crestor.  Pt presents today for follow up. He restarted his Crestor after the 2 week hold period. Noted return of slight joint pain (not overly bothersome) when he resumed his statin which had improved during his 2 week hold. He has been working on dietary improvements recently and has already lost 10 lbs. AM weight at home around 260 lbs.  Current Medications: none Intolerances: rosuvastatin '10mg'$  daily - elevated CK, muscle aches and cramps Risk Factors: CAD, preDM, HTN, age, obesity LDL goal: '55mg'$ /dL  Diet: Cut out alcohol from diet (had been drinking 1/2 bottle of wine a day in the past), planning to try intermittent fasting  Exercise: 45 minutes on ellipitical 5x a week  Family History: Father with heart disease and stroke  Social History: Former smoker 1 PPD for 35 years, quit in 2010, denies alcohol and drug use.  Labs: 04/18/22 - CK 191 (off Crestor for 2 weeks) 03/30/22 - CK 309 (on Crestor '10mg'$  daily) 08/08/21 - CK 268 (on Crestor '10mg'$  daily) 02/01/21 - TC 128, TG 62, HDL 53, LDL 62 (on Crestor '10mg'$  daily)  Past Medical History:  Diagnosis Date   Angio-edema    CAD (coronary artery disease)    Diabetes mellitus without complication ([place])    type 2   HTN (hypertension) 10/28/2012   Hypercholesteremia    Low testosterone    Metabolic syndrome     Mitral valve regurgitation    Myocardial infarction ([place])    Obesity    OSA on CPAP     Current Outpatient Medications on File Prior to Visit  Medication Sig Dispense Refill   aspirin EC 81 MG tablet Take 1 tablet (81 mg total) by mouth daily. 90 tablet 3   Calcium Carbonate (CALCIUM 500 PO) Take 500 mg by mouth daily.     Cholecalciferol (VITAMIN D3) 125 MCG (5000 UT) CAPS Take 1 capsule by mouth daily.     diclofenac (VOLTAREN) 75 MG EC tablet Take 75 mg by mouth 2 (two) times daily.     fexofenadine-pseudoephedrine (ALLEGRA-D 24) 180-240 MG 24 hr tablet Take 1 tablet by mouth daily.     fluocinonide (LIDEX) 0.05 % external solution daily at 6 (six) AM.     halobetasol (ULTRAVATE) 0.05 % ointment Apply 1 application topically daily as needed. (contact dermatitis)     ibuprofen (ADVIL) 200 MG tablet Take 200-400 mg by mouth daily as needed for headache or moderate pain.     lisinopril-hydrochlorothiazide (ZESTORETIC) 20-12.5 MG tablet TAKE 1 TABLET BY MOUTH DAILY 90 tablet 3   metFORMIN (GLUCOPHAGE) 500 MG tablet Take 500 mg by mouth daily with breakfast.     nitroGLYCERIN (NITROSTAT) 0.4 MG SL  tablet Place 1 tablet (0.4 mg total) under the tongue every 5 (five) minutes as needed for chest pain. 25 tablet 5   oxymetazoline (AFRIN) 0.05 % nasal spray Place 1 spray into both nostrils at bedtime as needed for congestion.     sodium chloride (OCEAN) 0.65 % SOLN nasal spray Place 2 sprays into both nostrils in the morning and at bedtime.  0   Current Facility-Administered Medications on File Prior to Visit  Medication Dose Route Frequency Provider Last Rate Last Admin   regadenoson (LEXISCAN) injection SOLN 0.4 mg  0.4 mg Intravenous Once Nahser, Wonda Cheng, MD        No Known Allergies  Assessment/Plan:  1. Hyperlipidemia - LDL goal < 55 given hx of CAD, preDM, age and HTN. His CK increased while on rosuvastatin. Discussed rechallenging with another statin + ezetimibe, PCSK9i, and Nexlizet  today. Pt wishes to avoid injections and prefers to try Nexlizet. Steven Mckee submit prior authorization and follow up with pt once approved. Steven Mckee recheck fasting labs + CK in 6-8 weeks.  Steven Mckee, PharmD, BCACP, [facility] [address] [place], [place] [address] Phone: [phone]; Fax: [phone] 05/10/2022 3:05 PM

## 2022-05-10 NOTE — Telephone Encounter (Signed)
Nexlizet prior authorization denied because insurance wants him to try another statin or Zetia. Appeals letter has been submitted as he experienced CK elevation on rosuvastatin so statin rechallenge is not ideal, and ezetimibe monotherapy is not effective enough to bring his LDL to goal. Pt is aware.

## 2022-05-16 ENCOUNTER — Telehealth: Payer: Self-pay | Admitting: Cardiology

## 2022-05-16 NOTE — Telephone Encounter (Signed)
Spoke with the patient' wife and advised that patient is scheduled for lab work on 1/8 so we [place] keep that as scheduled. We can decide once those labs are resulted if anything additional is needed prior to the patient seeing Dr. Marlou Porch in June 2024.

## 2022-05-16 NOTE — Telephone Encounter (Signed)
Pt is a pt of Dr. Tamala Julian and is switching to Dr. Marlou Porch and he would like to know if he should schedule and have labs ordered before his appt on 06/24. Please advise.

## 2022-05-17 DIAGNOSIS — E1159 Type 2 diabetes mellitus with other circulatory complications: Secondary | ICD-10-CM | POA: Diagnosis not present

## 2022-05-17 DIAGNOSIS — E7800 Pure hypercholesterolemia, unspecified: Secondary | ICD-10-CM | POA: Diagnosis not present

## 2022-05-17 DIAGNOSIS — G4733 Obstructive sleep apnea (adult) (pediatric): Secondary | ICD-10-CM | POA: Diagnosis not present

## 2022-05-22 ENCOUNTER — Encounter: Payer: Self-pay | Admitting: Pharmacist

## 2022-05-22 ENCOUNTER — Encounter: Payer: Self-pay | Admitting: Interventional Cardiology

## 2022-05-22 DIAGNOSIS — G4733 Obstructive sleep apnea (adult) (pediatric): Secondary | ICD-10-CM | POA: Diagnosis not present

## 2022-05-22 DIAGNOSIS — J439 Emphysema, unspecified: Secondary | ICD-10-CM | POA: Diagnosis not present

## 2022-05-22 DIAGNOSIS — Z0000 Encounter for general adult medical examination without abnormal findings: Secondary | ICD-10-CM | POA: Diagnosis not present

## 2022-05-22 DIAGNOSIS — E1159 Type 2 diabetes mellitus with other circulatory complications: Secondary | ICD-10-CM | POA: Diagnosis not present

## 2022-05-22 DIAGNOSIS — E7800 Pure hypercholesterolemia, unspecified: Secondary | ICD-10-CM | POA: Diagnosis not present

## 2022-05-23 MED ORDER — NEXLIZET 180-10 MG PO TABS: 1.0000 | ORAL_TABLET | Freq: Every day | ORAL | 11 refills | Status: DC

## 2022-05-23 NOTE — Telephone Encounter (Signed)
Pt sent in 2 MyChart messages - I have already responded to pt in the other one.

## 2022-05-23 NOTE — Telephone Encounter (Signed)
Appeals overturned, Nexlizet now approved by [employer]. Rx sent to pharmacy, pt notified via MyChart message.

## 2022-05-25 DIAGNOSIS — G4733 Obstructive sleep apnea (adult) (pediatric): Secondary | ICD-10-CM | POA: Diagnosis not present

## 2022-05-31 DIAGNOSIS — Z03818 Encounter for observation for suspected exposure to other biological agents ruled out: Secondary | ICD-10-CM | POA: Diagnosis not present

## 2022-05-31 DIAGNOSIS — R0981 Nasal congestion: Secondary | ICD-10-CM | POA: Diagnosis not present

## 2022-06-18 ENCOUNTER — Other Ambulatory Visit: Payer: Medicare Other

## 2022-06-26 DIAGNOSIS — D0471 Carcinoma in situ of skin of right lower limb, including hip: Secondary | ICD-10-CM | POA: Diagnosis not present

## 2022-06-26 DIAGNOSIS — D045 Carcinoma in situ of skin of trunk: Secondary | ICD-10-CM | POA: Diagnosis not present

## 2022-06-26 DIAGNOSIS — C4442 Squamous cell carcinoma of skin of scalp and neck: Secondary | ICD-10-CM | POA: Diagnosis not present

## 2022-06-26 DIAGNOSIS — L409 Psoriasis, unspecified: Secondary | ICD-10-CM | POA: Diagnosis not present

## 2022-06-29 ENCOUNTER — Ambulatory Visit: Payer: Medicare Other | Attending: Cardiology

## 2022-06-29 DIAGNOSIS — E782 Mixed hyperlipidemia: Secondary | ICD-10-CM

## 2022-06-29 LAB — COMPREHENSIVE METABOLIC PANEL
ALT: 34 IU/L (ref 0–44)
AST: 33 IU/L (ref 0–40)
Albumin/Globulin Ratio: 2.4 — ABNORMAL HIGH (ref 1.2–2.2)
Albumin: 5 g/dL — ABNORMAL HIGH (ref 3.8–4.8)
Alkaline Phosphatase: 60 IU/L (ref 44–121)
BUN/Creatinine Ratio: 19 (ref 10–24)
BUN: 22 mg/dL (ref 8–27)
Bilirubin Total: 0.7 mg/dL (ref 0.0–1.2)
CO2: 26 mmol/L (ref 20–29)
Calcium: 10.1 mg/dL (ref 8.6–10.2)
Chloride: 104 mmol/L (ref 96–106)
Creatinine, Ser: 1.13 mg/dL (ref 0.76–1.27)
Globulin, Total: 2.1 g/dL (ref 1.5–4.5)
Glucose: 110 mg/dL — ABNORMAL HIGH (ref 70–99)
Potassium: 5.2 mmol/L (ref 3.5–5.2)
Sodium: 142 mmol/L (ref 134–144)
Total Protein: 7.1 g/dL (ref 6.0–8.5)
eGFR: 69 mL/min/{1.73_m2} (ref >59)

## 2022-06-29 LAB — LIPID PANEL
Chol/HDL Ratio: 3.4 ratio (ref 0.0–5.0)
Cholesterol, Total: 150 mg/dL (ref 100–199)
HDL: 44 mg/dL (ref >39)
LDL Chol Calc (NIH): 84 mg/dL (ref 0–99)
Triglycerides: 123 mg/dL (ref 0–149)
VLDL Cholesterol Cal: 22 mg/dL (ref 5–40)

## 2022-07-02 ENCOUNTER — Encounter: Payer: Self-pay | Admitting: Internal Medicine

## 2022-07-02 ENCOUNTER — Ambulatory Visit: Payer: Medicare Other | Admitting: Internal Medicine

## 2022-07-02 ENCOUNTER — Telehealth: Payer: Self-pay | Admitting: Pharmacist

## 2022-07-02 ENCOUNTER — Ambulatory Visit (INDEPENDENT_AMBULATORY_CARE_PROVIDER_SITE_OTHER): Payer: Medicare Other | Admitting: Internal Medicine

## 2022-07-02 VITALS — BP 120/62 | HR 69 | Temp 98.6°F | Wt 261.0 lb

## 2022-07-02 DIAGNOSIS — J849 Interstitial pulmonary disease, unspecified: Secondary | ICD-10-CM

## 2022-07-02 DIAGNOSIS — E782 Mixed hyperlipidemia: Secondary | ICD-10-CM

## 2022-07-02 LAB — PULMONARY FUNCTION TEST
DL/VA % pred: 106 %
DL/VA: 4.29 ml/min/mmHg/L
DLCO cor % pred: 81 %
DLCO cor: 21.09 ml/min/mmHg
DLCO unc % pred: 81 %
DLCO unc: 21.09 ml/min/mmHg
FEF 25-75 Pre: 3.78 L/sec
FEF2575-%Pred-Pre: 155 %
FEV1-%Pred-Pre: 86 %
FEV1-Pre: 2.78 L
FEV1FVC-%Pred-Pre: 118 %
FEV6-%Pred-Pre: 77 %
FEV6-Pre: 3.21 L
FEV6FVC-%Pred-Pre: 105 %
FVC-%Pred-Pre: 73 %
FVC-Pre: 3.21 L
Pre FEV1/FVC ratio: 87 %
Pre FEV6/FVC Ratio: 100 %

## 2022-07-02 NOTE — Progress Notes (Signed)
Spirometry and Dlco done today. 
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
## 2022-07-02 NOTE — Telephone Encounter (Signed)
CK was not checked with labs last week. Called [facility] to add this on. They Dontrae confirm they have enough of the sample and [place] fax over form to sign before running lab.

## 2022-07-02 NOTE — Patient Instructions (Addendum)
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  I would call this is subclinical because its not reflected in your symptoms or exam or your breathing test or walking desaturation test  -The basis for this is not known.  But broadly speaking it could be IPF [based on age, ethnicity and male gender, saw dust and former smoking] versus non-- IPF.  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

## 2022-07-04 LAB — CK

## 2022-07-04 LAB — SPECIMEN STATUS REPORT

## 2022-07-04 NOTE — Telephone Encounter (Signed)
Not enough specimen to add on CK. Pt Steven Mckee come in on 2/5 to recheck lipids and CK.

## 2022-07-09 ENCOUNTER — Other Ambulatory Visit: Payer: Medicare Other

## 2022-07-12 ENCOUNTER — Ambulatory Visit: Payer: Medicare Other | Admitting: Internal Medicine

## 2022-07-16 ENCOUNTER — Ambulatory Visit: Payer: Medicare Other | Attending: Cardiology

## 2022-07-16 DIAGNOSIS — E782 Mixed hyperlipidemia: Secondary | ICD-10-CM

## 2022-07-17 ENCOUNTER — Encounter: Payer: Self-pay | Admitting: Cardiology

## 2022-07-17 LAB — LIPID PANEL
Chol/HDL Ratio: 3.6 ratio (ref 0.0–5.0)
Cholesterol, Total: 160 mg/dL (ref 100–199)
HDL: 44 mg/dL (ref >39)
LDL Chol Calc (NIH): 95 mg/dL (ref 0–99)
Triglycerides: 115 mg/dL (ref 0–149)
VLDL Cholesterol Cal: 21 mg/dL (ref 5–40)

## 2022-07-17 LAB — CK: Total CK: 185 U/L (ref 41–331)

## 2022-07-18 NOTE — Telephone Encounter (Signed)
Pt sent in mychart message to me as well - quick recap: pt had CK elevation on rosuvastatin '10mg'$  daily. Changed to Nexlizet, CK has normalized but LDL is above goal. Previously didn't want to try injectable medication but I reached out seeing if he'd like to consider Repatha now for better efficacy.

## 2022-07-19 ENCOUNTER — Telehealth: Payer: Self-pay | Admitting: Pharmacist

## 2022-07-19 DIAGNOSIS — E782 Mixed hyperlipidemia: Secondary | ICD-10-CM

## 2022-07-19 MED ORDER — REPATHA SURECLICK 140 MG/ML ~~LOC~~ SOAJ: 1.0000 | SUBCUTANEOUS | 11 refills | Status: DC

## 2022-07-19 NOTE — Telephone Encounter (Signed)
Pt agreeable to try Repatha as his LDL has increased since switching from rosuvastatin '10mg'$  daily (d/c due to elevated CK) to Nexlizet. LDL above goal < 55. Patrice submit prior auth for Repatha, Key: [id number], approved through 01/17/23. Instruction info sent to pt via mychart message. He Teyon stop Nexlizet when he starts Repatha. Staley recheck labs in 2 months.

## 2022-07-19 NOTE — Telephone Encounter (Signed)
Pt sent in MyChart message to me as well about starting Repatha - I have responded in other MyChart message. Rx sent to pharmacy along with injection info, f/u labs scheduled.

## 2022-07-19 NOTE — Telephone Encounter (Deleted)
Pt sent in MyChart message to me as well about starting Repatha - I have responded in other MyChart message. Rx sent to pharmacy along with injection info, f/u labs scheduled.

## 2022-07-24 ENCOUNTER — Ambulatory Visit: Payer: Self-pay | Admitting: Internal Medicine

## 2022-07-25 NOTE — Progress Notes (Signed)
Interstitial Lung Disease Multidisciplinary Conference   Korby Peluso    MRN [id number]    DOB Dec 19, 1950  Primary Care Physician:Wong, Edwyna Shell, MD (Inactive)  Referring Physician: Dr. Brand Males  Time of Conference: 7.00am- 8.00am Date of conference: 07/24/2022 Location of Conference: -  Virtual  Participating Pulmonary: Dr. Brand Males,  Dr Marshell Garfinkel Pathology:  Radiology: Dr Eddie Candle Others:   Brief History: Former smoker. Stable. Normal PFT x 6 - 9 months. CT with "ILD". Is this ILA? Is this Prob UIP? Does he need biopsy?  PFT is normal. No symptoms.    PFT    Latest Ref Rng & Units 07/02/2022   10:27 AM 03/30/2022    1:56 PM 12/26/2021    8:58 AM  PFT Results  FVC-Pre L 3.21  3.21  3.24   FVC-Predicted Pre % 73  72  73   FVC-Post L   3.21   FVC-Predicted Post %   72   Pre FEV1/FVC % % 87  88  87   Post FEV1/FCV % %   90   FEV1-Pre L 2.78  2.82  2.83   FEV1-Predicted Pre % 86  86  86   FEV1-Post L   2.89   DLCO uncorrected ml/min/mmHg 21.09  21.63  21.52   DLCO UNC% % 81  83  83   DLCO corrected ml/min/mmHg 21.09  21.63  21.52   DLCO COR %Predicted % 81  83  83   DLVA Predicted % 106  112  108   TLC L   4.67   TLC % Predicted %   66   RV % Predicted %   54       MDD discussion of CT scan    - Date or time period of scan:  HRCT: 09/04/2021   - Discussion synopsis:  - Has some GGO , band like in lung bases, quite a bit of ground glass in lung bases, handful nodularity in apices. Some air trapping in lung bases +.  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 Given air trapping - consider NSIP.   - What is the final conclusion per 2018 ATS/Fleischner Criteria - ALTERNATIVE DX . - NSIP v HP given air trapping  - Concordance with official report: DISCORDANT, No progression  Pathology discussion of biopsy: n/a    MDD Impression/Recs: Definite ILD. Consider alt dx. Consider biopsy (preferred) versus serial followup   Time Spent in  preparation and discussion:  > 30 min    SIGNATURE   Dr. Brand Males, M.D., F.C.C.P,  Pulmonary and Critical Care Medicine Staff Physician, [place] Director - Interstitial Lung Disease  Program  [facility] [place], [place], [address]  Pager: [phone], If no answer or between  15:00h - 7:00h: call [phone] Telephone: [phone]  10:15 AM 07/25/2022 ...................................................................................................................Marland Kitchen References: Diagnosis of Hypersensitivity Pneumonitis in Adults. An Official ATS/JRS/ALAT Clinical Practice Guideline. Ragu G et al, [place] Aug 1;202(3):e36-e69.       Diagnosis of Idiopathic Pulmonary Fibrosis. An Official ATS/ERS/JRS/ALAT Clinical Practice Guideline. Raghu G et al, [place]. 2018 Sep 1;198(5):e44-e68.   IPF Suspected   Histopath ology Pattern      UIP  Probable UIP  Indeterminate for  UIP  Alternative  diagnosis    UIP  IPF  IPF  IPF  Non-IPF dx   HRCT  Probabe UIP  IPF  IPF  IPF (Likely)**  Non-IPF dx  Pattern  Indeterminate for UIP  IPF  IPF (Likely)**  Indeterminate  for IPF**  Non-IPF dx    Alternative diagnosis  IPF (Likely)**/ non-IPF dx  Non-IPF dx  Non-IPF dx  Non-IPF dx     Idiopathic pulmonary fibrosis diagnosis based upon HRCT and Biopsy paterns.  ** IPF is the likely diagnosis when any of following features are present:  Moderate-to-severe traction bronchiectasis/bronchiolectasis (defined as mild traction bronchiectasis/bronchiolectasis in four or more lobes including the lingual as a lobe, or moderate to severe traction bronchiectasis in two or more lobes) in a man over age 8 years or in a woman over age 8 years Extensive (>30%) reticulation on HRCT and an age >70 years  Increased neutrophils and/or absence of  lymphocytosis in BAL fluid  Multidisciplinary discussion reaches a confident diagnosis of IPF.   **Indeterminate for IPF  Without an adequate biopsy is unlikely to be IPF  With an adequate biopsy may be reclassified to a more specific diagnosis after multidisciplinary discussion and/or additional consultation.   dx = diagnosis; HRCT = high-resolution computed tomography; IPF = idiopathic pulmonary fibrosis; UIP = usual interstitial pneumonia.

## 2022-07-30 DIAGNOSIS — C4442 Squamous cell carcinoma of skin of scalp and neck: Secondary | ICD-10-CM | POA: Diagnosis not present

## 2022-07-30 DIAGNOSIS — D045 Carcinoma in situ of skin of trunk: Secondary | ICD-10-CM | POA: Diagnosis not present

## 2022-07-30 DIAGNOSIS — L409 Psoriasis, unspecified: Secondary | ICD-10-CM | POA: Diagnosis not present

## 2022-08-21 ENCOUNTER — Encounter: Payer: Self-pay | Admitting: Internal Medicine

## 2022-08-21 DIAGNOSIS — J849 Interstitial pulmonary disease, unspecified: Secondary | ICD-10-CM

## 2022-08-30 ENCOUNTER — Other Ambulatory Visit: Payer: Self-pay

## 2022-08-30 DIAGNOSIS — J849 Interstitial pulmonary disease, unspecified: Secondary | ICD-10-CM

## 2022-09-06 ENCOUNTER — Ambulatory Visit (HOSPITAL_BASED_OUTPATIENT_CLINIC_OR_DEPARTMENT_OTHER)
Admission: RE | Admit: 2022-09-06 | Discharge: 2022-09-06 | Disposition: A | Discharge status: Home / Self Care | Payer: Medicare Other | Source: Ambulatory Visit | Attending: Internal Medicine | Admitting: Internal Medicine

## 2022-09-06 ENCOUNTER — Ambulatory Visit (HOSPITAL_BASED_OUTPATIENT_CLINIC_OR_DEPARTMENT_OTHER): Payer: Medicare Other

## 2022-09-06 ENCOUNTER — Ambulatory Visit (HOSPITAL_BASED_OUTPATIENT_CLINIC_OR_DEPARTMENT_OTHER)
Admission: RE | Admit: 2022-09-06 | Discharge: 2022-09-06 | Disposition: A | Discharge status: Home / Self Care | Payer: Medicare Other | Source: Ambulatory Visit | Attending: Acute Care | Admitting: Acute Care

## 2022-09-06 ENCOUNTER — Encounter (HOSPITAL_BASED_OUTPATIENT_CLINIC_OR_DEPARTMENT_OTHER): Payer: Self-pay

## 2022-09-06 DIAGNOSIS — J849 Interstitial pulmonary disease, unspecified: Secondary | ICD-10-CM | POA: Insufficient documentation

## 2022-09-06 DIAGNOSIS — Z87891 Personal history of nicotine dependence: Secondary | ICD-10-CM | POA: Diagnosis not present

## 2022-09-06 DIAGNOSIS — Z122 Encounter for screening for malignant neoplasm of respiratory organs: Secondary | ICD-10-CM | POA: Insufficient documentation

## 2022-09-06 DIAGNOSIS — J479 Bronchiectasis, uncomplicated: Secondary | ICD-10-CM | POA: Diagnosis not present

## 2022-09-07 ENCOUNTER — Other Ambulatory Visit: Payer: Self-pay

## 2022-09-07 DIAGNOSIS — Z87891 Personal history of nicotine dependence: Secondary | ICD-10-CM

## 2022-09-07 DIAGNOSIS — Z122 Encounter for screening for malignant neoplasm of respiratory organs: Secondary | ICD-10-CM

## 2022-09-19 NOTE — Progress Notes (Signed)
Wil discuss during OV 10/15/22 Xxxxxxx  IMPRESSION: 1. Peribronchovascular and subpleural reticular and ground-glass opacities with a slight mid and lower lung predominant distribution. No evidence of progressive fibrotic changes when compared with the prior exam. Moderate to severe air trapping which is better appreciated on today's exam. 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 Findings are suggestive of an alternative diagnosis (not UIP) per consensus guidelines: Diagnosis of Idiopathic Pulmonary Fibrosis: An Official ATS/ERS/JRS/ALAT Clinical Practice Guideline. Am Rosezetta Schlatter Crit Care Med Vol 198, Iss 5, [phone], Feb 09 2017. 2. Lung-RADS 1, negative. Continue annual screening with low-dose chest CT without contrast in 12 months. 3. Aortic Atherosclerosis (ICD10-I70.0).   Electronically Signed   By: Allegra Lai M.D.   On: 09/06/2022 17:13

## 2022-10-11 DIAGNOSIS — M25572 Pain in left ankle and joints of left foot: Secondary | ICD-10-CM | POA: Diagnosis not present

## 2022-10-15 ENCOUNTER — Encounter: Payer: Self-pay | Admitting: Internal Medicine

## 2022-10-15 ENCOUNTER — Ambulatory Visit (INDEPENDENT_AMBULATORY_CARE_PROVIDER_SITE_OTHER): Payer: Medicare Other | Admitting: Internal Medicine

## 2022-10-15 ENCOUNTER — Ambulatory Visit: Payer: Medicare Other | Admitting: Internal Medicine

## 2022-10-15 VITALS — BP 120/70 | HR 76 | Ht 70.0 in | Wt 262.0 lb

## 2022-10-15 DIAGNOSIS — J849 Interstitial pulmonary disease, unspecified: Secondary | ICD-10-CM | POA: Diagnosis not present

## 2022-10-15 LAB — PULMONARY FUNCTION TEST
DL/VA % pred: 117 %
DL/VA: 4.75 ml/min/mmHg/L
DLCO cor % pred: 87 %
DLCO cor: 22.52 ml/min/mmHg
DLCO unc % pred: 87 %
DLCO unc: 22.52 ml/min/mmHg
FEF 25-75 Pre: 3.88 L/sec
FEF2575-%Pred-Pre: 160 %
FEV1-%Pred-Pre: 87 %
FEV1-Pre: 2.81 L
FEV1FVC-%Pred-Pre: 119 %
FEV6-%Pred-Pre: 77 %
FEV6-Pre: 3.2 L
FEV6FVC-%Pred-Pre: 105 %
FVC-%Pred-Pre: 73 %
FVC-Pre: 3.2 L
Pre FEV1/FVC ratio: 88 %
Pre FEV6/FVC Ratio: 100 %

## 2022-10-15 NOTE — Patient Instructions (Signed)
Spirometry and DLCO Performed Today.  

## 2022-10-15 NOTE — Patient Instructions (Addendum)
ILD (interstitial lung disease) ([facility]) Former smoker Down comforter exposure   -You continue to have normal pulmonary function tests.  CT scan of the chest also shows unchanged pulmonary fibrosis over few years.  The consensus in a conference discussion February 2024 is that you have mild interstitial lung disease, stable interstitial lung disease but the cause in the future course even though it is currently stable is unknown.  -Differential diagnosis includes hypersensitive pneumonitis [from your down comforter exposure], fibrotic NSIP or idiopathic pulmonary fibrosis.  All of these varieties can progress all the different rates.  The former to tend to progress very slowly while the latter is more aggressive.  Plan Conference consensus is to recommend a biopsy for you as the preferred approach   -2 options to do biopsy   -Option A bronchoscopy method: This to be under anesthesia but there is no cutting from the outside.  Total duration of procedure is around 30 minutes.  Washings and mosquito sized biopsy pieces Michial be taken and sent for genomic classifier.  Limitation is chance of a positive yield is around 30%.  Advantages that it is minimally invasive and it is a good first step in restratification     -Option B: Surgical lung biopsy: This requires general anesthesia and incision from the outside of the chest.  I request 2-5-day hospital stay.  Is the gold standard.  Details of this procedure can be obtained from discussion consultation with the surgeon [Dr. Dorris Fetch or Dr. Valentino Saxon.  While it is more invasive than the bronchoscopy method I think you are still at low risk for complications from this procedure.   -Option C: Is to continue to follow and intervene if there is progression.  This is reasonable but less optimal approach.   followup -Call us if you decide for biopsy.  Otherwise -4 months - 30 min visit; but after spiro/dlco  - walk and ILD symptoms score at followup

## 2022-10-15 NOTE — Progress Notes (Signed)
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    Heart disease Father    Stroke Father    Allergic rhinitis Sister    Asthma Neg Hx    Eczema Neg Hx    Urticaria Neg Hx      Current Outpatient Medications:    aspirin EC 81 MG tablet, Take 1 tablet (81 mg total) by mouth daily., Disp: 90 tablet, Rfl: 3   Evolocumab (REPATHA SURECLICK) 140 MG/ML SOAJ, Inject 140 mg into the skin every 14 (fourteen) days., Disp: 2 mL, Rfl: 11   fexofenadine-pseudoephedrine (ALLEGRA-D 24) 180-240 MG 24 hr tablet, Take 1 tablet by mouth daily., Disp: , Rfl:    fluocinonide (LIDEX) 0.05 % external solution, daily at 6 (six) AM., Disp: , Rfl:    halobetasol  (ULTRAVATE) 0.05 % ointment, Apply 1 application topically daily as needed. (contact dermatitis), Disp: , Rfl:    ibuprofen (ADVIL) 200 MG tablet, Take 200-400 mg by mouth daily as needed for headache or moderate pain., Disp: , Rfl:    lisinopril-hydrochlorothiazide (ZESTORETIC) 20-12.5 MG tablet, TAKE 1 TABLET BY MOUTH DAILY, Disp: 90 tablet, Rfl: 3   metFORMIN (GLUCOPHAGE) 500 MG tablet, Take 500 mg by mouth daily with breakfast., Disp: , Rfl:    nitroGLYCERIN (NITROSTAT) 0.4 MG SL tablet, Place 1 tablet (0.4 mg total) under the tongue every 5 (five) minutes as needed for chest pain., Disp: 25 tablet, Rfl: 5   sodium chloride (OCEAN) 0.65 % SOLN nasal spray, Place 2 sprays into both nostrils in the morning and at bedtime., Disp: , Rfl: 0 No current facility-administered medications for this visit.  Facility-Administered Medications Ordered in Other Visits:    regadenoson (LEXISCAN) injection SOLN 0.4 mg, 0.4 mg, Intravenous, Once, Nahser, Deloris Ping, MD      Objective:   Vitals:   10/15/22 1403  BP: 120/70  Pulse: 76  SpO2: 95%  Weight: 262 lb (118.8 kg)  Height: 5\' 10"  (1.778 m)    Estimated body mass index is 37.59 kg/m as calculated from the following:   Height as of this encounter: 5\' 10"  (1.778 m).   Weight as of this encounter: 262 lb (118.8 kg).  @WEIGHTCHANGE @  [employer]   10/15/22 1403  Weight: 262 lb (118.8 kg)     Physical Exam General: No distress. obese Neuro: Alert and Oriented x 3. GCS 15. Speech normal Psych: Pleasant Resp:  Barrel Chest - no.  Wheeze - no, Crackles - yes, No overt respiratory distress CVS: Normal heart sounds. Murmurs - no Ext: Stigmata of Connective Tissue Disease - no HEENT: Normal upper airway. PEERL +. No post nasal drip        Assessment:       ICD-10-CM   1. ILD (interstitial lung disease) (HCC)  J84.9 Pulmonary function test         Plan:     Patient Instructions  ILD (interstitial lung disease) (HCC) Former  smoker  Obesity  -You have very mild interstitial lung disease.  I would call this is subclinical because its not reflected in your symptoms or exam or your breathing test or walking desaturation test  -The basis for this is not known.  But broadly speaking it could be IPF [based on age, ethnicity and male gender, saw dust and former smoking] versus non-- IPF.  IPF tends to be progressive over a few or several years while non-- IPF tends to be progressive only occasionally  - Down comforter causes non-IPF variety called HP   - Leaning towrds IPF in your case   - No change July 2023 -> Jan 2024   Plan - Do HRCT supine and prone in 3 - 4months (last March 2023)  - do no do LDCT chest if scheduled - do repeat spirometry and dlco in 3-4 months - Flor discuss in case conferecn - emailed Navigator  Followup  -3-4 months - 30 min visit; but after spiro/dlco  - walk and ILD symptoms score at followup  ( Level 05 visit: Estb 40-54 min  New 60-74 min   in  visit type: on-site physical face to visit  in total care time and counseling or/and coordination of care by this undersigned MD - Dr Kalman Shan. This includes one or more of the following on this same day 10/15/2022: pre-charting, chart review, note writing, documentation discussion of test results, diagnostic or treatment recommendations, prognosis, risks and benefits of management options, instructions, education, compliance or risk-factor reduction. It excludes time spent by the CMA or office staff in the care of the patient. Actual time 45 min)   SIGNATURE    Dr. Kalman Shan, M.D., F.C.C.P,  Pulmonary and Critical Care Medicine Staff Physician, [facility] Center Director - Interstitial Lung Disease  Program  Pulmonary Fibrosis [facility] [place], [place], [address]  Pager: [phone], If no answer or between  15:00h - 7:00h: call [phone] Telephone: [phone]  2:15  PM 10/15/2022

## 2022-10-15 NOTE — Progress Notes (Signed)
Spirometry and DLCO Performed Today.  

## 2022-11-23 DIAGNOSIS — E1136 Type 2 diabetes mellitus with diabetic cataract: Secondary | ICD-10-CM | POA: Diagnosis not present

## 2022-11-23 DIAGNOSIS — G720 Drug-induced myopathy: Secondary | ICD-10-CM | POA: Diagnosis not present

## 2022-11-23 DIAGNOSIS — T466X5A Adverse effect of antihyperlipidemic and antiarteriosclerotic drugs, initial encounter: Secondary | ICD-10-CM | POA: Diagnosis not present

## 2022-11-23 DIAGNOSIS — J069 Acute upper respiratory infection, unspecified: Secondary | ICD-10-CM | POA: Diagnosis not present

## 2022-11-23 DIAGNOSIS — I2510 Atherosclerotic heart disease of native coronary artery without angina pectoris: Secondary | ICD-10-CM | POA: Diagnosis not present

## 2022-11-23 DIAGNOSIS — M791 Myalgia, unspecified site: Secondary | ICD-10-CM | POA: Diagnosis not present

## 2022-11-23 DIAGNOSIS — J439 Emphysema, unspecified: Secondary | ICD-10-CM | POA: Diagnosis not present

## 2022-11-23 DIAGNOSIS — I10 Essential (primary) hypertension: Secondary | ICD-10-CM | POA: Diagnosis not present

## 2022-11-23 DIAGNOSIS — E7800 Pure hypercholesterolemia, unspecified: Secondary | ICD-10-CM | POA: Diagnosis not present

## 2022-11-23 DIAGNOSIS — I700 Atherosclerosis of aorta: Secondary | ICD-10-CM | POA: Diagnosis not present

## 2022-12-03 ENCOUNTER — Ambulatory Visit: Payer: Medicare Other | Attending: Cardiology | Admitting: Cardiology

## 2022-12-03 ENCOUNTER — Encounter: Payer: Self-pay | Admitting: Cardiology

## 2022-12-03 VITALS — BP 128/72 | HR 69 | Ht 70.0 in | Wt 260.4 lb

## 2022-12-03 DIAGNOSIS — E119 Type 2 diabetes mellitus without complications: Secondary | ICD-10-CM | POA: Diagnosis not present

## 2022-12-03 DIAGNOSIS — Z7984 Long term (current) use of oral hypoglycemic drugs: Secondary | ICD-10-CM | POA: Diagnosis not present

## 2022-12-03 DIAGNOSIS — E782 Mixed hyperlipidemia: Secondary | ICD-10-CM | POA: Diagnosis not present

## 2022-12-03 DIAGNOSIS — I25119 Atherosclerotic heart disease of native coronary artery with unspecified angina pectoris: Secondary | ICD-10-CM

## 2022-12-03 DIAGNOSIS — I10 Essential (primary) hypertension: Secondary | ICD-10-CM

## 2022-12-03 DIAGNOSIS — I340 Nonrheumatic mitral (valve) insufficiency: Secondary | ICD-10-CM | POA: Diagnosis not present

## 2022-12-03 NOTE — Progress Notes (Signed)
Cardiology Office Note:    Date:  12/03/2022   ID:  Steven Mckee, DOB [birth year], MRN [id number]  PCP:  Ileana Ladd, MD (Inactive)   [facility] Providers Cardiologist:  Donato Schultz, MD Sleep Medicine:  Armanda Magic, MD     Referring MD: No ref. provider found    History of Present Illness:    Steven Mckee is a 72 y.o. male former patient of  Dr Katrinka Blazing. PMH is significant for:  CAD with MI and chronic right coronary occlusion Cardiac catheterization 07/2009-50% proximal LAD stenosis, total occlusion right coronary artery with left-to-right collaterals, moderate mitral regurgitation  Interstitial lung disease Prediabetes Hypertension Hyperlipidemia  Nuclear stress test 2020: low risk with inferolateral wall ischemia (occluded right coronary artery with right to left collaterals noted)  Echocardiogram 09/23/2020: Mild to moderate mitral regurgitation eccentric with posterior leaflet prolapse       Mild to moderate mitral regurgitation, HTN, HLD, preDM, obesity, OSA, and ED. Follows with pulmonology for interstitial lung disease and has had autoimmune profiles including CK lab checks. CK was elevated at 309 on 03/30/22. Pt also reported muscle cramps and aches. He was advised to hold his Crestor and recheck CK 2 weeks after statin hold. Follow up CK improved to 191 off of his Crestor.   Had CK elevation on rosuvastatin 10 mg daily.  He was changed to Nexlizet.  CK normalized but LDL still above goal.  Previously did not want to try injectable.  Megan Supple with lipid clinic encouraged Repatha.  He was agreeable on 07/19/2022.  Down to LDL of 30 now.  Excellent.  He was very Engineer, petroleum.  He sees Dr. Marchelle Gearing.  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 enjoys the  big green egg.  Has CPAP. Sleeps better sitting up. Sinus issues. Lots of ENT, Allergist.  Can't take Zyrtec.   Past Medical History:  Diagnosis Date   Angio-edema    CAD (coronary artery disease)    Diabetes mellitus without complication (HCC)    type 2   HTN (hypertension) 10/28/2012   Hypercholesteremia    Low testosterone    Metabolic syndrome    Mitral valve regurgitation    Myocardial infarction (HCC)    Obesity    OSA on CPAP     Past Surgical History:  Procedure Laterality Date   COLONOSCOPY     SKIN SURGERY     Mohs   VENTRAL HERNIA REPAIR N/A 01/11/2020   Procedure: OPEN REPAIR VENTRAL HERNIA WITH MESH PATCH;  Surgeon: Darnell Level, MD;  Location: [facility];  Service: General;  Laterality: N/A;  LMA    Current Medications: Current Meds  Medication Sig   aspirin EC 81 MG tablet Take 1 tablet (81 mg total) by mouth daily.   Evolocumab (REPATHA SURECLICK) 140 MG/ML SOAJ Inject 140 mg into the skin every 14 (fourteen) days.   fexofenadine-pseudoephedrine (ALLEGRA-D 24) 180-240 MG 24 hr tablet Take 1 tablet by mouth daily.   halobetasol (ULTRAVATE) 0.05 % ointment Apply 1 application topically daily as needed. (contact dermatitis)   ibuprofen (ADVIL) 200 MG tablet Take 200-400 mg by mouth daily as needed for headache or moderate pain.   lisinopril-hydrochlorothiazide (ZESTORETIC) 20-12.5 MG tablet TAKE 1 TABLET BY MOUTH DAILY   metFORMIN (GLUCOPHAGE) 500 MG tablet Take 500 mg by  mouth daily with breakfast.   nitroGLYCERIN (NITROSTAT) 0.4 MG SL tablet Place 1 tablet (0.4 mg total) under the tongue every 5 (five) minutes as needed for chest pain.   sodium chloride (OCEAN) 0.65 % SOLN nasal spray Place 2 sprays into both nostrils in the morning and at bedtime.     Allergies:   Rosuvastatin   Social History   Socioeconomic History   Marital status: Married    Spouse name: Not on file   Number of children: Not on file   Years of education: Not on file   Highest education  level: Not on file  Occupational History   Not on file  Tobacco Use   Smoking status: Former    Packs/day: 1.00    Years: 35.00    Additional pack years: 0.00    Total pack years: 35.00    Types: Cigarettes    Start date: 65    Quit date: 03/05/2009    Years since quitting: 13.7   Smokeless tobacco: Never  Vaping Use   Vaping Use: Never used  Substance and Sexual Activity   Alcohol use: Yes    Comment: Occasional   Drug use: Never   Sexual activity: Never  Other Topics Concern   Not on file  Social History Narrative   Not on file   Social Determinants of Health   Financial Resource Strain: Not on file  Food Insecurity: Not on file  Transportation Needs: Not on file  Physical Activity: Not on file  Stress: Not on file  Social Connections: Not on file     Family History: The patient's family history includes Allergic rhinitis in his mother and sister; Dementia in his mother; Heart disease in his father; Stroke in his father. There is no history of Asthma, Eczema, or Urticaria.  ROS:   Please see the history of present illness.     All other systems reviewed and are negative.  EKGs/Labs/Other Studies Reviewed:      Recent Labs: 06/29/2022: ALT 34; BUN 22; Creatinine, Ser 1.13; Potassium 5.2; Sodium 142  Recent Lipid Panel    Component Value Date/Time   CHOL 160 07/16/2022 0940   CHOL 126 11/13/2012 0841   TRIG 115 07/16/2022 0940   TRIG 68 06/15/2013 0856   TRIG 112 11/13/2012 0841   HDL 44 07/16/2022 0940   HDL 61 06/15/2013 0856   HDL 46 11/13/2012 0841   CHOLHDL 3.6 07/16/2022 0940   CHOLHDL 3 02/26/2014 0756   VLDL 21.0 02/26/2014 0756   LDLCALC 95 07/16/2022 0940   LDLCALC 78 06/15/2013 0856   LDLCALC 58 11/13/2012 0841     Risk Assessment/Calculations:               Physical Exam:    VS:  BP 128/72   Pulse 69   Ht 5\' 10"  (1.778 m)   Wt 260 lb 6.4 oz (118.1 kg)   SpO2 96%   BMI 37.36 kg/m     Wt Readings from Last 3 Encounters:   12/03/22 260 lb 6.4 oz (118.1 kg)  10/15/22 262 lb (118.8 kg)  10/15/22 261 lb 9.6 oz (118.7 kg)     GEN:  Well nourished, well developed in no acute distress HEENT: Normal NECK: No JVD; No carotid bruits LYMPHATICS: No lymphadenopathy CARDIAC: RRR, 2 out of 6 systolic murmur,no rubs, gallops RESPIRATORY:  Clear to auscultation without rales, wheezing or rhonchi  ABDOMEN: Soft, non-tender, non-distended MUSCULOSKELETAL:  No edema; No deformity  SKIN: Warm and  dry NEUROLOGIC:  Alert and oriented x 3 PSYCHIATRIC:  Normal affect   ASSESSMENT:    1. Mitral valve insufficiency, unspecified etiology   2. Mixed hyperlipidemia   3. Coronary artery disease involving native coronary artery of native heart with angina pectoris (HCC)   4. Diabetes mellitus with coincident hypertension (HCC)    PLAN:    In order of problems listed above:  Coronary artery disease - Cardiac catheterization 2011 with moderate LAD disease nonobstructive, occluded RCA with left-to-right collaterals - Continue with aggressive medical management.  No significant anginal symptoms. Sometimes has a bruise  like symptom once a year. Feels fit as a fiddle. Ellipitcle 5 days a week. [place]ond in backyard 50 years.   Mild to moderate mitral regurgitation - Echocardiogram 2020 showed eccentric jet with posterior leaflet prolapse.  In 2011 regurgitation was characterized as moderate on catheterization - Repeat echocardiogram   Statin intolerance due to muscle aches and elevated CK with Crestor - Tried Zetia and bempedoic acid combination but LDL did not decrease far enough. - He was preapproved for Repatha back in February 2024. LDL 30.   Interstitial lung disease - Followed by Dr. Marchelle Gearing in pulmonary clinic  Obstructive sleep apnea - On CPAP.  Diabetes with hypertension - Per primary team.  On metformin.  Hemoglobin A1c 6.7.  Suggested GLP-1 agonist to help him with his weight as well.  He Keante think about this.   Can contact us, Aundra Millet can help with this if necessary.  Talked about diet exercise          Medication Adjustments/Labs and Tests Ordered: Current medicines are reviewed at length with the patient today.  Concerns regarding medicines are outlined above.  Orders Placed This Encounter  Procedures   ECHOCARDIOGRAM COMPLETE   No orders of the defined types were placed in this encounter.   Patient Instructions  Medication Instructions:  The current medical regimen is effective;  continue present plan and medications.  *If you need a refill on your cardiac medications before your next appointment, please call your pharmacy*  Testing/Procedures: Your physician has requested that you have an echocardiogram. Echocardiography is a painless test that uses sound waves to create images of your heart. It provides your doctor with information about the size and shape of your heart and how well your heart's chambers and valves are working. This procedure takes approximately one hour. There are no restrictions for this procedure. Please do NOT wear cologne, perfume, aftershave, or lotions (deodorant is allowed). Please arrive 15 minutes prior to your appointment time.   Follow-Up: At [facility], you and your health needs are our priority.  As part of our continuing mission to provide you with exceptional heart care, we have created designated Provider Care Teams.  These Care Teams include your primary Cardiologist (physician) and Advanced Practice Providers (APPs -  Physician Assistants and Nurse Practitioners) who all work together to provide you with the care you need, when you need it.  We recommend signing up for the patient portal called "MyChart".  Sign up information is provided on this After Visit Summary.  MyChart is used to connect with patients for Virtual Visits (Telemedicine).  Patients are able to view lab/test results, encounter notes, upcoming appointments, etc.  Non-urgent  messages can be sent to your provider as well.   To learn more about what you can do with MyChart, go to [email or web address].    Your next appointment:   1 year(s)  Provider:   Dr  Donato Schultz        Signed, Donato Schultz, MD  12/03/2022 8:39 AM    [facility]

## 2022-12-03 NOTE — Patient Instructions (Signed)
Medication Instructions:  The current medical regimen is effective;  continue present plan and medications.  *If you need a refill on your cardiac medications before your next appointment, please call your pharmacy*  Testing/Procedures: Your physician has requested that you have an echocardiogram. Echocardiography is a painless test that uses sound waves to create images of your heart. It provides your doctor with information about the size and shape of your heart and how well your heart's chambers and valves are working. This procedure takes approximately one hour. There are no restrictions for this procedure. Please do NOT wear cologne, perfume, aftershave, or lotions (deodorant is allowed). Please arrive 15 minutes prior to your appointment time.  Follow-Up: At [facility], you and your health needs are our priority.  As part of our continuing mission to provide you with exceptional heart care, we have created designated Provider Care Teams.  These Care Teams include your primary Cardiologist (physician) and Advanced Practice Providers (APPs -  Physician Assistants and Nurse Practitioners) who all work together to provide you with the care you need, when you need it.  We recommend signing up for the patient portal called "MyChart".  Sign up information is provided on this After Visit Summary.  MyChart is used to connect with patients for Virtual Visits (Telemedicine).  Patients are able to view lab/test results, encounter notes, upcoming appointments, etc.  Non-urgent messages can be sent to your provider as well.   To learn more about what you can do with MyChart, go to [email or web address].    Your next appointment:   1 year(s)  Provider:   Dr Mark Skains      

## 2022-12-18 ENCOUNTER — Other Ambulatory Visit: Payer: Self-pay | Admitting: *Deleted

## 2022-12-18 MED ORDER — LISINOPRIL-HYDROCHLOROTHIAZIDE 20-12.5 MG PO TABS: 1.0000 | ORAL_TABLET | Freq: Every day | ORAL | 3 refills | Status: DC

## 2022-12-26 DIAGNOSIS — L570 Actinic keratosis: Secondary | ICD-10-CM | POA: Diagnosis not present

## 2022-12-26 DIAGNOSIS — D485 Neoplasm of uncertain behavior of skin: Secondary | ICD-10-CM | POA: Diagnosis not present

## 2022-12-26 DIAGNOSIS — T2022XA Burn of second degree of lip(s), initial encounter: Secondary | ICD-10-CM | POA: Diagnosis not present

## 2022-12-26 DIAGNOSIS — D044 Carcinoma in situ of skin of scalp and neck: Secondary | ICD-10-CM | POA: Diagnosis not present

## 2022-12-31 ENCOUNTER — Other Ambulatory Visit (HOSPITAL_COMMUNITY): Payer: Self-pay

## 2022-12-31 ENCOUNTER — Ambulatory Visit (HOSPITAL_COMMUNITY): Payer: Medicare Other | Attending: Internal Medicine

## 2022-12-31 ENCOUNTER — Telehealth: Payer: Self-pay

## 2022-12-31 DIAGNOSIS — I340 Nonrheumatic mitral (valve) insufficiency: Secondary | ICD-10-CM | POA: Diagnosis not present

## 2022-12-31 LAB — ECHOCARDIOGRAM COMPLETE
Area-P 1/2: 2.79 cm2
S' Lateral: 3.6 cm

## 2022-12-31 NOTE — Telephone Encounter (Signed)
Pharmacy Patient Advocate Encounter   Received notification from CoverMyMeds that prior authorization for REPATHA is required/requested.   Insurance verification completed.   The patient is insured through [facility] .   Per test claim: PA submitted to [facility] via CoverMyMeds Key/confirmation #/EOC [id number] Status is pending

## 2022-12-31 NOTE — Telephone Encounter (Signed)
Pharmacy Patient Advocate Encounter  Received notification from [facility] that Prior Authorization for REPATHA has been APPROVED THRU 06/11/23

## 2023-01-03 DIAGNOSIS — C4442 Squamous cell carcinoma of skin of scalp and neck: Secondary | ICD-10-CM | POA: Diagnosis not present

## 2023-01-30 DIAGNOSIS — L570 Actinic keratosis: Secondary | ICD-10-CM | POA: Diagnosis not present

## 2023-01-30 DIAGNOSIS — C4442 Squamous cell carcinoma of skin of scalp and neck: Secondary | ICD-10-CM | POA: Diagnosis not present

## 2023-02-19 ENCOUNTER — Ambulatory Visit: Payer: Medicare Other | Admitting: Internal Medicine

## 2023-02-19 ENCOUNTER — Ambulatory Visit (INDEPENDENT_AMBULATORY_CARE_PROVIDER_SITE_OTHER): Payer: Medicare Other | Admitting: Internal Medicine

## 2023-02-19 ENCOUNTER — Encounter: Payer: Self-pay | Admitting: Internal Medicine

## 2023-02-19 VITALS — BP 110/80 | HR 62 | Ht 70.0 in | Wt 258.8 lb

## 2023-02-19 DIAGNOSIS — J849 Interstitial pulmonary disease, unspecified: Secondary | ICD-10-CM

## 2023-02-19 DIAGNOSIS — Z87891 Personal history of nicotine dependence: Secondary | ICD-10-CM | POA: Diagnosis not present

## 2023-02-19 LAB — PULMONARY FUNCTION TEST
DL/VA % pred: 105 %
DL/VA: 4.23 ml/min/mmHg/L
DLCO cor % pred: 73 %
DLCO cor: 19.02 ml/min/mmHg
DLCO unc % pred: 73 %
DLCO unc: 19.02 ml/min/mmHg
FEF 25-75 Pre: 3.83 L/s
FEF2575-%Pred-Pre: 158 %
FEV1-%Pred-Pre: 79 %
FEV1-Pre: 2.57 L
FEV1FVC-%Pred-Pre: 120 %
FEV6-%Pred-Pre: 70 %
FEV6-Pre: 2.91 L
FEV6FVC-%Pred-Pre: 106 %
FVC-%Pred-Pre: 66 %
FVC-Pre: 2.91 L
Pre FEV1/FVC ratio: 88 %
Pre FEV6/FVC Ratio: 100 %

## 2023-02-19 NOTE — Patient Instructions (Addendum)
ILD (interstitial lung disease) (HCC) Former smoker Down comforter exposure    The consensus in a conference discussion February 2024 is that you have mild interstitial lung disease, stable interstitial lung disease but the cause in the future course even though it is currently stable is unknown.  However September 2024 pulmonary function shows a slight decline although used to continue to feel minimal to no symptoms.  -Differential diagnosis includes hypersensitive pneumonitis [from your down comforter exposure], fibrotic NSIP or idiopathic pulmonary fibrosis.  All of these varieties can progress all the different rates.  The former to tend to progress very slowly while the latter is more aggressive.  Plan - Conference consensus is to recommend a biopsy for you as the preferred approach .  With the recent PFT suggesting progression there is an increasing need to consider lung biopsy.  The reason for lung biopsies to differentiate between a fibrotic state which would require antifibrotic's which would only prevent progression versus an inflammatory state which would require treatment with immunomodulators  (as steroids or CellCept or Imuran or Rituxan] with potential for improvement  -Respect  that you want to hold off on doing biopsy right now or committing to treatment but instead we took a shared decision making   -Meet surgeons either Dr. Dorris Fetch or Dr. Brynda Greathouse to consider surgical lung biopsy and understands procedure  -Do high-resolution CT chest supine and prone in 3 months [most recent 10 August 2022 and we can capture the progression over 9 months]  -Do spirometry and DLCO in 3 months  followup -3 months - 30 min visit; but after spiro/dlco and CT and seenig surgeons  - walk and ILD symptoms score at followup

## 2023-02-19 NOTE — Progress Notes (Signed)
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 Dyspnea Score 0 0 0    How bad is your cough? 00 0 0 0   How bad is your fatigue 0 0  0   How bad is nausea 0 0 0 0   How bad is vomiting?  0 0 0 0   How bad is diarrhea? 0 00 0 0   How bad is anxiety? 0 0     How bad is depression 0 00 0 0   Any chronic pain - if so where and how bad 0 0 0 0       PFT     Latest Ref Rng & Units 02/19/2023    9:30 AM 10/15/2022    1:08 PM 07/02/2022   10:27 AM 03/30/2022    1:56 PM 12/26/2021    8:58 AM  PFT Results  FVC-Pre L 2.91  P 3.20  3.21  3.21  3.24   FVC-Predicted Pre % 66  P 73  73  72  73   FVC-Post L     3.21   FVC-Predicted Post %     72   Pre FEV1/FVC % % 88  P 88  87  88  87    Post FEV1/FCV % %     90   FEV1-Pre L 2.57  P 2.81  2.78  2.82  2.83   FEV1-Predicted Pre % 79  P 87  86  86  86   FEV1-Post L     2.89   DLCO uncorrected ml/min/mmHg 19.02  P 22.52  21.09  21.63  21.52   DLCO UNC% % 73  P 87  81  83  83   DLCO corrected ml/min/mmHg 19.02  P 22.52  21.09  21.63  21.52   DLCO COR %Predicted % 73  P 87  81  83  83   DLVA Predicted % 105  P 117  106  112  108   TLC L     4.67   TLC % Predicted %     66   RV % Predicted %     54     P Preliminary result      LAB RESULTS last 96 hours No results found.  LAB RESULTS last 90 days Recent Results (from the past 2160 hour(s))  ECHOCARDIOGRAM COMPLETE     Status: None   Collection Time: 12/31/22  8:57 AM  Result Value Ref Range   Area-P 1/2 2.79 cm2   S' Lateral 3.60 cm   Est EF 65 - 70%   Pulmonary function test     Status: None (Preliminary result)   Collection Time: 02/19/23  9:30 AM  Result Value Ref Range   FVC-Pre 2.91 L   FVC-%Pred-Pre 66 %   FEV1-Pre 2.57 L   FEV1-%Pred-Pre 79 %   FEV6-Pre 2.91 L   FEV6-%Pred-Pre 70 %   Pre FEV1/FVC ratio 88 %   FEV1FVC-%Pred-Pre 120 %   Pre FEV6/FVC Ratio 100 %   FEV6FVC-%Pred-Pre 106 %   FEF 25-75 Pre 3.83 L/sec   FEF2575-%Pred-Pre 158 %   DLCO unc 19.02 ml/min/mmHg   DLCO unc % pred 73 %   DLCO cor 19.02 ml/min/mmHg   DLCO cor % pred 73 %   DL/VA 1.61 ml/min/mmHg/L   DL/VA % pred 096 %         has a past medical history of Angio-edema, CAD (coronary artery disease), Diabetes mellitus without complication (HCC),  HTN (hypertension) (10/28/2012), Hypercholesteremia, Low testosterone, Metabolic syndrome, Mitral valve regurgitation, Myocardial infarction (HCC), Obesity, and OSA on CPAP.   reports that he quit smoking about 13 years ago. His smoking use included cigarettes. He started smoking about 54 years ago. He has a 40.7 pack-year smoking history. He has never used smokeless tobacco.  Past Surgical History:  Procedure Laterality Date    COLONOSCOPY     SKIN SURGERY     Mohs   VENTRAL HERNIA REPAIR N/A 01/11/2020   Procedure: OPEN REPAIR VENTRAL HERNIA WITH MESH PATCH;  Surgeon: Darnell Level, MD;  Location: [facility];  Service: General;  Laterality: N/A;  LMA    Allergies  Allergen Reactions   Rosuvastatin     Elevated CK    Immunization History  Administered Date(s) Administered   Fluad Quad(high Dose 65+) 03/30/2022   Influenza, High Dose Seasonal PF 05/24/2021   Influenza-Unspecified 03/04/2013   PFIZER(Purple Top)SARS-COV-2 Vaccination 07/04/2019, 07/26/2019, 04/01/2020   Pneumococcal Conjugate-13 05/24/2021    Family History  Problem Relation Age of Onset   Dementia Mother    Allergic rhinitis Mother    Heart disease Father    Stroke Father    Allergic rhinitis Sister    Asthma Neg Hx    Eczema Neg Hx    Urticaria Neg Hx      Current Outpatient Medications:    aspirin EC 81 MG tablet, Take 1 tablet (81 mg total) by mouth daily., Disp: 90 tablet, Rfl: 3   Evolocumab (REPATHA SURECLICK) 140 MG/ML SOAJ, Inject 140 mg into the skin every 14 (fourteen) days., Disp: 2 mL, Rfl: 11   fexofenadine-pseudoephedrine (ALLEGRA-D 24) 180-240 MG 24 hr tablet, Take 1 tablet by mouth daily., Disp: , Rfl:    halobetasol (ULTRAVATE) 0.05 % ointment, Apply 1 application topically daily as needed. (contact dermatitis), Disp: , Rfl:    ibuprofen (ADVIL) 200 MG tablet, Take 200-400 mg by mouth daily as needed for headache or moderate pain., Disp: , Rfl:    lisinopril-hydrochlorothiazide (ZESTORETIC) 20-12.5 MG tablet, Take 1 tablet by mouth daily., Disp: 90 tablet, Rfl: 3   metFORMIN (GLUCOPHAGE) 500 MG tablet, Take 500 mg by mouth daily with breakfast., Disp: , Rfl:    nitroGLYCERIN (NITROSTAT) 0.4 MG SL tablet, Place 1 tablet (0.4 mg total) under the tongue every 5 (five) minutes as needed for chest pain., Disp: 25 tablet, Rfl: 5   sodium chloride (OCEAN) 0.65 % SOLN nasal spray, Place 2 sprays into both nostrils in the morning  and at bedtime., Disp: , Rfl: 0 No current facility-administered medications for this visit.  Facility-Administered Medications Ordered in Other Visits:    regadenoson (LEXISCAN) injection SOLN 0.4 mg, 0.4 mg, Intravenous, Once, Nahser, Deloris Ping, MD      Objective:   Vitals:   02/19/23 1052  BP: 110/80  Pulse: 62  SpO2: 96%  Weight: 258 lb 12.8 oz (117.4 kg)  Height: 5\' 10"  (1.778 m)    Estimated body mass index is 37.13 kg/m as calculated from the following:   Height as of this encounter: 5\' 10"  (1.778 m).   Weight as of this encounter: 258 lb 12.8 oz (117.4 kg).  @WEIGHTCHANGE @  [employer]   02/19/23 1052  Weight: 258 lb 12.8 oz (117.4 kg)     Physical Exam   General: No distress. [place] wek O2 at rest: no Cane present: no Sitting in wheel chair: no Frail: no Obese: yes Neuro: Alert and Oriented x 3. GCS 15. Speech normal Psych:  Pleasant Resp:  Barrel Chest - no.  Wheeze - no, Crackles - YES MILD BASE, No overt respiratory distress CVS: Normal heart sounds. Murmurs - n Ext: Stigmata of Connective Tissue Disease - no HEENT: Normal upper airway. PEERL +. No post nasal drip. SCALP bandage left forehead cranium        Assessment:       ICD-10-CM   1. ILD (interstitial lung disease) (HCC)  J84.9 Pulmonary function test    CT Chest High Resolution    2. Former smoker  Z87.891          Plan:     Patient Instructions  ILD (interstitial lung disease) (HCC) Former smoker Down comforter exposure    The consensus in a conference discussion February 2024 is that you have mild interstitial lung disease, stable interstitial lung disease but the cause in the future course even though it is currently stable is unknown.  However September 2024 pulmonary function shows a slight decline although used to continue to feel minimal to no symptoms.  -Differential diagnosis includes hypersensitive pneumonitis [from your down comforter exposure], fibrotic NSIP or  idiopathic pulmonary fibrosis.  All of these varieties can progress all the different rates.  The former to tend to progress very slowly while the latter is more aggressive.  Plan - Conference consensus is to recommend a biopsy for you as the preferred approach .  With the recent PFT suggesting progression there is an increasing need to consider lung biopsy.  The reason for lung biopsies to differentiate between a fibrotic state which would require antifibrotic's which would only prevent progression versus an inflammatory state which would require treatment with immunomodulators  (as steroids or CellCept or Imuran or Rituxan] with potential for improvement  -Respect  that you want to hold off on doing biopsy right now or committing to treatment but instead we took a shared decision making   -Meet surgeons either Dr. Dorris Fetch or Dr. Brynda Greathouse to consider surgical lung biopsy and understands procedure  -Do high-resolution CT chest supine and prone in 3 months [most recent 10 August 2022 and we can capture the progression over 9 months]  -Do spirometry and DLCO in 3 months  followup -3 months - 30 min visit; but after spiro/dlco and CT and seenig surgeons  - walk and ILD symptoms score at followup   FOLLOWUP Return in about 3 months (around 05/21/2023) for 30 min visit, after HRCT chest, after Cleda Daub and DLCO, ILD, with Dr Marchelle Gearing, Face to Face Visit.    SIGNATURE    Dr. Kalman Shan, M.D., F.C.C.P,  Pulmonary and Critical Care Medicine Staff Physician, [facility] Director - Interstitial Lung Disease  Program  Pulmonary Fibrosis [facility] at [facility] [place], [place], [address]  Pager: [phone], If no answer or between  15:00h - 7:00h: call [phone] Telephone: [phone]  11:01 AM 02/19/2023

## 2023-02-19 NOTE — Patient Instructions (Signed)
Spiro/DLCO performed today. 

## 2023-02-19 NOTE — Progress Notes (Signed)
Spiro/DLCO performed today. 

## 2023-02-27 ENCOUNTER — Encounter: Payer: Self-pay | Admitting: Pharmacist

## 2023-02-27 DIAGNOSIS — Z5189 Encounter for other specified aftercare: Secondary | ICD-10-CM | POA: Diagnosis not present

## 2023-03-01 ENCOUNTER — Telehealth: Payer: Self-pay | Admitting: Pharmacist

## 2023-03-01 MED ORDER — MOUNJARO 2.5 MG/0.5ML ~~LOC~~ SOAJ: 2.5000 mg | SUBCUTANEOUS | 0 refills | Status: DC

## 2023-03-01 NOTE — Telephone Encounter (Signed)
Pt interested in starting GLP for weight loss, has T2DM. A1c 6.7% on metformin. PA submitted for [place], key [id number]. See mychart message for details.

## 2023-03-01 NOTE — Telephone Encounter (Signed)
PA approved through 06/11/23. Rx sent to pharmacy to see if copay is affordable, pt to let me know.

## 2023-03-06 NOTE — Telephone Encounter (Signed)
Spoke with pt, states Mounjaro copay was cost prohibitive since he's in the donut hole.  He is very pleased with his Repatha. Has been tolerating med very well, had updated labs at [facility] in June available in [facility] - updated LDL was excellent at 30.

## 2023-04-01 DIAGNOSIS — H40013 Open angle with borderline findings, low risk, bilateral: Secondary | ICD-10-CM | POA: Diagnosis not present

## 2023-04-01 DIAGNOSIS — H02834 Dermatochalasis of left upper eyelid: Secondary | ICD-10-CM | POA: Diagnosis not present

## 2023-04-01 DIAGNOSIS — Z961 Presence of intraocular lens: Secondary | ICD-10-CM | POA: Diagnosis not present

## 2023-04-01 DIAGNOSIS — H02831 Dermatochalasis of right upper eyelid: Secondary | ICD-10-CM | POA: Diagnosis not present

## 2023-05-01 DIAGNOSIS — L578 Other skin changes due to chronic exposure to nonionizing radiation: Secondary | ICD-10-CM | POA: Diagnosis not present

## 2023-05-01 DIAGNOSIS — L821 Other seborrheic keratosis: Secondary | ICD-10-CM | POA: Diagnosis not present

## 2023-05-01 DIAGNOSIS — C44719 Basal cell carcinoma of skin of left lower limb, including hip: Secondary | ICD-10-CM | POA: Diagnosis not present

## 2023-05-01 DIAGNOSIS — Z85828 Personal history of other malignant neoplasm of skin: Secondary | ICD-10-CM | POA: Diagnosis not present

## 2023-05-01 DIAGNOSIS — L570 Actinic keratosis: Secondary | ICD-10-CM | POA: Diagnosis not present

## 2023-05-01 DIAGNOSIS — D044 Carcinoma in situ of skin of scalp and neck: Secondary | ICD-10-CM | POA: Diagnosis not present

## 2023-05-01 DIAGNOSIS — L400 Psoriasis vulgaris: Secondary | ICD-10-CM | POA: Diagnosis not present

## 2023-05-01 DIAGNOSIS — L814 Other melanin hyperpigmentation: Secondary | ICD-10-CM | POA: Diagnosis not present

## 2023-05-01 DIAGNOSIS — D225 Melanocytic nevi of trunk: Secondary | ICD-10-CM | POA: Diagnosis not present

## 2023-05-01 DIAGNOSIS — Z86018 Personal history of other benign neoplasm: Secondary | ICD-10-CM | POA: Diagnosis not present

## 2023-05-01 DIAGNOSIS — D485 Neoplasm of uncertain behavior of skin: Secondary | ICD-10-CM | POA: Diagnosis not present

## 2023-05-14 ENCOUNTER — Encounter: Payer: Self-pay | Admitting: Cardiology

## 2023-05-21 ENCOUNTER — Ambulatory Visit (HOSPITAL_BASED_OUTPATIENT_CLINIC_OR_DEPARTMENT_OTHER)
Admission: RE | Admit: 2023-05-21 | Discharge: 2023-05-21 | Disposition: A | Discharge status: Home / Self Care | Payer: Medicare Other | Source: Ambulatory Visit | Attending: Internal Medicine | Admitting: Internal Medicine

## 2023-05-21 DIAGNOSIS — C44719 Basal cell carcinoma of skin of left lower limb, including hip: Secondary | ICD-10-CM | POA: Diagnosis not present

## 2023-05-21 DIAGNOSIS — J8410 Pulmonary fibrosis, unspecified: Secondary | ICD-10-CM | POA: Diagnosis not present

## 2023-05-21 DIAGNOSIS — R918 Other nonspecific abnormal finding of lung field: Secondary | ICD-10-CM | POA: Diagnosis not present

## 2023-05-21 DIAGNOSIS — D044 Carcinoma in situ of skin of scalp and neck: Secondary | ICD-10-CM | POA: Diagnosis not present

## 2023-05-21 DIAGNOSIS — L570 Actinic keratosis: Secondary | ICD-10-CM | POA: Diagnosis not present

## 2023-05-21 DIAGNOSIS — J849 Interstitial pulmonary disease, unspecified: Secondary | ICD-10-CM | POA: Insufficient documentation

## 2023-05-23 ENCOUNTER — Ambulatory Visit: Payer: Medicare Other | Admitting: Internal Medicine

## 2023-05-23 DIAGNOSIS — J849 Interstitial pulmonary disease, unspecified: Secondary | ICD-10-CM

## 2023-05-23 LAB — PULMONARY FUNCTION TEST
DL/VA % pred: 106 %
DL/VA: 4.31 ml/min/mmHg/L
DLCO cor % pred: 78 %
DLCO cor: 20.32 ml/min/mmHg
DLCO unc % pred: 78 %
DLCO unc: 20.32 ml/min/mmHg
FEF 25-75 Pre: 3.74 L/s
FEF2575-%Pred-Pre: 154 %
FEV1-%Pred-Pre: 86 %
FEV1-Pre: 2.79 L
FEV1FVC-%Pred-Pre: 118 %
FEV6-%Pred-Pre: 77 %
FEV6-Pre: 3.21 L
FEV6FVC-%Pred-Pre: 106 %
FVC-%Pred-Pre: 73 %
FVC-Pre: 3.21 L
Pre FEV1/FVC ratio: 87 %
Pre FEV6/FVC Ratio: 100 %

## 2023-05-23 NOTE — Progress Notes (Signed)
Spirometry/DLCO performed today. 

## 2023-05-23 NOTE — Patient Instructions (Signed)
Spirometry/DLCO performed today. 

## 2023-05-27 ENCOUNTER — Ambulatory Visit: Payer: Medicare Other | Admitting: Internal Medicine

## 2023-05-27 ENCOUNTER — Encounter: Payer: Self-pay | Admitting: Internal Medicine

## 2023-05-27 VITALS — BP 109/73 | HR 68 | Ht 70.0 in | Wt 246.0 lb

## 2023-05-27 DIAGNOSIS — J849 Interstitial pulmonary disease, unspecified: Secondary | ICD-10-CM

## 2023-05-27 DIAGNOSIS — Z23 Encounter for immunization: Secondary | ICD-10-CM | POA: Diagnosis not present

## 2023-05-27 DIAGNOSIS — Z87891 Personal history of nicotine dependence: Secondary | ICD-10-CM | POA: Diagnosis not present

## 2023-05-27 DIAGNOSIS — Z7185 Encounter for immunization safety counseling: Secondary | ICD-10-CM | POA: Diagnosis not present

## 2023-05-27 NOTE — Patient Instructions (Addendum)
ILD (interstitial lung disease) (HCC) Former smoker Down comforter exposure    The consensus in a conference discussion February 2024 is that you have mild interstitial lung diseas. Differential diagnosis includes hypersensitive pneumonitis [from your down comforter exposure], fibrotic NSIP or idiopathic pulmonary fibrosis.  All of these varieties can progress all the different rates.  The former to tend to progress very slowly while the latter is more aggressive.  - confierence consensus was that bioopsy is recommended  As of 05/27/2023   -lung function looks stable   - CT scan to Dr Marchelle Gearing looks stable over time since 2024 -> Dec 2024 but official report pending  Plan - Refer Thoracic Surgery  Dr. Dorris Fetch to get an idea about surgical lung biopsy risks before we takke a decisioin - do spirometry and dlco in 5 months -  high dose flu shot 05/27/2023 - RSV vaccine on your own - give list of prior vaccines to CMA to populate our records  followup -5 months - 30 min visit; but after spiro/dlco and CT and seenig surgeons  - walk and ILD symptoms score at followup

## 2023-05-27 NOTE — Progress Notes (Signed)
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 smoker.  HPI Steven Mckee 72 y.o. -returns for follow-up.  Mckee is his 16th year wedding  anniversary.  Presents with his wife.  He says from a respiratory standpoint he is feeling Mckee.  No change in shortness of breath.  He did not see the surgeon because apparently the referral from my office did not go through.  He says he has been following calorie control and is lost 15 pounds of weight.  Wife is also lost 9 pounds.  They are happy about it.  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  He  and his wife also discussed his vaccination status.  He is in need of RSV vaccine and flu vaccine.  Steven Mckee.  I counseled him extensively about RSV and he Toy have it commercially.  He was able to get list of his other vaccines from his primary care.  I have asked CMA to populate this into our medical record.  In terms of progression of ILD he had pulmonary function test and it is Mckee.  He had high-resolution CT chest unfortunately the results are not back yet because of radiologist shortage.  Did indicate to him it Steven Mckee but my personal visualization showing it to him it looks Mckee.  Wife also feels that he is doing Mckee.  SYMPTOM SCALE - ILD 12/26/2021 10/20/20230 265#0 07/02/2022  10/15/2022  05/27/2023    Current weight  No re0al changes since July.     O2 use ra ra ra ra   Shortness of Breath 0 -> 5 scale with 5 being worst (score 6 If unable to do)      At rest 0 0 0 0   Simple tasks - showers, clothes change, eating, shaving 0 0 0 0   Household (dishes, doing bed, laundry) 0 0  0   Shopping 0 0 0 0   Walking level at own pace 0 0 0 0   Walking up Stairs 1 0 -but according to the wife when he climbs 50 steps he gets winded 0 0   Total (30-36) Dyspnea Score 0 0 0    How bad is your cough? 00 0 0 0   How bad is your fatigue 0 0  0   How bad is nausea 0 0 0 0   How bad is vomiting?  0 0 0 0   How bad is diarrhea? 0 00 0 0   How bad is anxiety? 0 0     How bad is depression 0 00 0 0   Any chronic pain - if so where and how bad 0 0 0 0      Simple office walk 224 (66+46 x 2) feet Pod A at [facility] x  3 laps goal with forehead probe 05/27/2023    O2 used ra   Number laps completed Sit stand x 15   Comments about pace Steady pace   Resting Pulse Ox/HR 96% % and 70/min   Final Pulse Ox/HR 94% % and 81/min   Desaturated </= 88% no   Desaturated <= 3% points no   Got Tachycardic >/= 90/min no   Symptoms at end of test nx    Miscellaneous comments normal      CT Chest data from date: **12!0/24  - personally visualized and independently interpreted : yes - my findings are: Official report is pending but I do not think his ILD is changed over time.  PFT     Latest Ref Rng & Units 05/23/2023    4:18 PM 02/19/2023    9:30 AM 10/15/2022    1:08 PM 07/02/2022   10:27 AM 03/30/2022    1:56 PM 12/26/2021    8:58 AM  ILD indicators  FVC-Pre L 3.21  P 2.91  3.20  3.21  3.21  3.24   FVC-Predicted Pre % 73  P 66  73  73  72  73   FVC-Post L      3.21   FVC-Predicted Post %      72   TLC L      4.67   TLC Predicted %      66   DLCO uncorrected ml/min/mmHg 20.32  P 19.02  22.52  21.09  21.63  21.52   DLCO UNC %Pred % 78  P 73  87  81  83  83   DLCO Corrected ml/min/mmHg 20.32  P 19.02  22.52  21.09  21.63  21.52   DLCO COR %Pred % 78  P 73  87  81  83  83     P Preliminary result      LAB RESULTS last 96 hours No results found.  LAB RESULTS last 90 days Recent Results (from the past 2160 hours)  Pulmonary function test     Status: None (Preliminary result)   Collection Time: 05/23/23  4:18 PM  Result Value Ref Range   FVC-Pre 3.21 L   FVC-%Pred-Pre 73 %   FEV1-Pre 2.79 L   FEV1-%Pred-Pre 86 %   FEV6-Pre 3.21 L   FEV6-%Pred-Pre 77 %   Pre FEV1/FVC ratio 87 %   FEV1FVC-%Pred-Pre 118 %   Pre FEV6/FVC Ratio 100 %   FEV6FVC-%Pred-Pre 106 %   FEF 25-75 Pre 3.74 L/sec   FEF2575-%Pred-Pre 154 %   DLCO unc 20.32 ml/min/mmHg   DLCO unc % pred 78 %   DLCO cor 20.32 ml/min/mmHg   DLCO cor % pred 78 %   DL/VA 0.98 ml/min/mmHg/L   DL/VA % pred 119 %         has a past medical history of Angio-edema, CAD (coronary artery disease), Diabetes mellitus without complication (HCC), HTN (hypertension) (10/28/2012), Hypercholesteremia, Low testosterone, Metabolic syndrome, Mitral valve regurgitation, Myocardial infarction (HCC), Obesity, and OSA on CPAP.   reports that he quit smoking about 14 years ago.  His smoking use included cigarettes. He started smoking about 54 years ago. He has a 40.7 pack-year smoking history. He has never used smokeless tobacco.  Past Surgical History:  Procedure Laterality Date   COLONOSCOPY     SKIN SURGERY     Mohs   VENTRAL HERNIA REPAIR N/A 01/11/2020   Procedure: OPEN REPAIR VENTRAL HERNIA WITH MESH PATCH;  Surgeon: Darnell Level, MD;  Location: [facility];  Service: General;  Laterality: N/A;  LMA    Allergies  Allergen Reactions   Rosuvastatin     Elevated CK    Immunization History  Administered Date(s) Administered   Fluad Quad(high Dose 65+) 03/30/2022   Influenza, High Dose Seasonal PF 05/24/2021   Influenza-Unspecified 03/04/2013   PFIZER(Purple Top)SARS-COV-2 Vaccination 07/04/2019, 07/26/2019, 04/01/2020   Pneumococcal Conjugate-13 05/24/2021    Family History  Problem Relation Age of Onset   Dementia Mother    Allergic rhinitis Mother    Heart disease Father    Stroke Father    Allergic rhinitis Sister    Asthma Neg Hx    Eczema Neg Hx  Urticaria Neg Hx      Current Outpatient Medications:    aspirin EC 81 MG tablet, Take 1 tablet (81 mg total) by mouth daily., Disp: 90 tablet, Rfl: 3   Evolocumab (REPATHA SURECLICK) 140 MG/ML SOAJ, Inject 140 mg into the skin every 14 (fourteen) days., Disp: 2 mL, Rfl: 11   halobetasol (ULTRAVATE) 0.05 % ointment, Apply 1 application topically daily as needed. (contact dermatitis), Disp: , Rfl:    ibuprofen (ADVIL) 200 MG tablet, Take 200-400 mg by mouth daily as needed for headache or moderate pain., Disp: , Rfl:    lisinopril-hydrochlorothiazide (ZESTORETIC) 20-12.5 MG tablet, Take 1 tablet by mouth daily., Disp: 90 tablet, Rfl: 3   metFORMIN (GLUCOPHAGE) 500 MG tablet, Take 500 mg by mouth daily with breakfast., Disp: , Rfl:    nitroGLYCERIN (NITROSTAT) 0.4 MG SL tablet, Place 1 tablet (0.4 mg total) under the tongue every 5 (five) minutes as needed for chest pain., Disp: 25 tablet, Rfl: 5    sodium chloride (OCEAN) 0.65 % SOLN nasal spray, Place 2 sprays into both nostrils in the morning and at bedtime. (Patient taking differently: Place 2 sprays into both nostrils in the morning and at bedtime. As needed), Disp: , Rfl: 0   fexofenadine-pseudoephedrine (ALLEGRA-D 24) 180-240 MG 24 hr tablet, Take 1 tablet by mouth daily., Disp: , Rfl:  No current facility-administered medications for this visit.  Facility-Administered Medications Ordered in Other Visits:    regadenoson (LEXISCAN) injection SOLN 0.4 mg, 0.4 mg, Intravenous, Once, Nahser, Deloris Ping, MD      Objective:   Vitals:   05/27/23 1542  BP: 109/73  Pulse: 68  SpO2: 96%  Weight: 246 lb (111.6 kg)  Height: 5\' 10"  (1.778 m)    Estimated body mass index is 35.3 kg/m as calculated from the following:   Height as of this encounter: 5\' 10"  (1.778 m).   Weight as of this encounter: 246 lb (111.6 kg).  @WEIGHTCHANGE @  Filed Weights   05/27/23 1542  Weight: 246 lb (111.6 kg)     Physical Exam   General: No distress. Look swl. SCALP HAIR REDUCED O2 at rest: no Cane present: no Sitting in wheel chair: no Frail: no Obese: no Neuro: Alert and Oriented x 3. GCS 15. Speech normal Psych: Pleasant Resp:  Barrel Chest - no.  Wheeze - no, Crackles - no, No overt respiratory distress CVS: Normal heart sounds. Murmurs - no Ext: Stigmata of Connective Tissue Disease - no HEENT: Normal upper airway. PEERL +. No post nasal drip        Assessment:       ICD-10-CM   1. ILD (interstitial lung disease) (HCC)  J84.9     2. Former smoker  Z87.891     3. Flu vaccine need  Z23     4. Vaccine counseling  Z71.85          Plan:     Patient Instructions  ILD (interstitial lung disease) (HCC) Former smoker Down comforter exposure    The consensus in a conference discussion February 2024 is that you have mild interstitial lung diseas. Differential diagnosis includes hypersensitive pneumonitis [from your down  comforter exposure], fibrotic NSIP or idiopathic pulmonary fibrosis.  All of these varieties can progress all the different rates.  The former to tend to progress very slowly while the latter is more aggressive.  - confierence consensus was that bioopsy is recommended  As of 05/27/2023   -lung function looks Mckee   - CT  scan to Dr Marchelle Gearing looks Mckee over time since 2024 -> Dec 2024 but official report pending  Plan - Refer Thoracic Surgery  Dr. Dorris Fetch to get an idea about surgical lung biopsy risks before we takke a decisioin - do spirometry and dlco in 5 months -  high dose flu shot 05/27/2023 - RSV vaccine on your own - give list of prior vaccines to CMA to populate our records  followup -5 months - 30 min visit; but after spiro/dlco and CT and seenig surgeons  - walk and ILD symptoms score at followup   FOLLOWUP Return in about 5 months (around 10/25/2023) for 15 min visit, after Cleda Daub and DLCO, ILD, with Dr Marchelle Gearing.    SIGNATURE    Dr. Kalman Shan, M.D., F.C.C.P,  Pulmonary and Critical Care Medicine Staff Physician, [facility] Director - Interstitial Lung Disease  Program  Pulmonary Fibrosis [facility] [place], [place], [address]  Pager: [phone], If no answer or between  15:00h - 7:00h: call [phone] Telephone: [phone]  4:22 PM 05/27/2023

## 2023-05-28 DIAGNOSIS — I2510 Atherosclerotic heart disease of native coronary artery without angina pectoris: Secondary | ICD-10-CM | POA: Diagnosis not present

## 2023-05-28 DIAGNOSIS — E7800 Pure hypercholesterolemia, unspecified: Secondary | ICD-10-CM | POA: Diagnosis not present

## 2023-05-28 DIAGNOSIS — Z0000 Encounter for general adult medical examination without abnormal findings: Secondary | ICD-10-CM | POA: Diagnosis not present

## 2023-05-28 DIAGNOSIS — I700 Atherosclerosis of aorta: Secondary | ICD-10-CM | POA: Diagnosis not present

## 2023-05-28 DIAGNOSIS — E1136 Type 2 diabetes mellitus with diabetic cataract: Secondary | ICD-10-CM | POA: Diagnosis not present

## 2023-05-28 DIAGNOSIS — I10 Essential (primary) hypertension: Secondary | ICD-10-CM | POA: Diagnosis not present

## 2023-06-09 ENCOUNTER — Encounter: Payer: Self-pay | Admitting: Internal Medicine

## 2023-06-09 NOTE — Progress Notes (Signed)
Steven Mckee according to radiology the interstitial lung disease is worse compared to February 2022.  Otherwise is worsening compared to 3 years ago but very similar to earlier this year.  I recommend that you do see and keep your appointment with thoracic surgery and take an informed decision about biopsy

## 2023-06-17 ENCOUNTER — Other Ambulatory Visit (HOSPITAL_COMMUNITY): Payer: Self-pay

## 2023-07-02 ENCOUNTER — Encounter: Payer: Self-pay | Admitting: Thoracic Surgery (Cardiothoracic Vascular Surgery)

## 2023-07-02 ENCOUNTER — Institutional Professional Consult (permissible substitution): Payer: Medicare Other | Admitting: Thoracic Surgery (Cardiothoracic Vascular Surgery)

## 2023-07-02 VITALS — BP 139/79 | HR 70 | Resp 18 | Ht 70.0 in | Wt 246.0 lb

## 2023-07-02 DIAGNOSIS — J849 Interstitial pulmonary disease, unspecified: Secondary | ICD-10-CM

## 2023-07-02 NOTE — Progress Notes (Signed)
PCP is Jackelyn Poling, DO Referring Provider is Kalman Shan, MD  Chief Complaint  Patient presents with   Interstitial Lung Disease    Review work up    HPI: Steven Mckee is sent for consultation regarding surgical biopsy for interstitial lung disease.  Steven Mckee is a 73 year old man with a past history significant for CAD, MI, hypertension, hyperlipidemia, type 2 diabetes, mitral regurgitation, obstructive sleep apnea, obesity, remote tobacco use, and interstitial lung disease.  He is followed by Dr. Marchelle Gearing for interstitial lung disease and was first noted on a low-dose CT.  He has been followed with high-resolution CTs since then.  He denies any chest pain, pressure, tightness, or shortness of breath, cough or wheezing.  Has a lot of questions about the diagnosis of ILD and significance.  Not sure he wants to have a biopsy.  He is fairly active.  No limitations to his activities.  He has lost about 20 pounds over the past 6 months intentionally with diet and exercise.  He recently saw Dr. Marchelle Gearing.  His CT showed progression of his interstitial lung disease compared to a couple of years ago.  His case was discussed at the ILD conference and the recommendation was for a surgical lung biopsy.  Zubrod Score: At the time of surgery this patient's most appropriate activity status/level should be described as: [x]     0    Normal activity, no symptoms []     1    Restricted in physical strenuous activity but ambulatory, able to do out light work []     2    Ambulatory and capable of self care, unable to do work activities, up and about >50 % of waking hours                              []     3    Only limited self care, in bed greater than 50% of waking hours []     4    Completely disabled, no self care, confined to bed or chair []     5    Moribund   Past Medical History:  Diagnosis Date   Angio-edema    CAD (coronary artery disease)    Diabetes mellitus without  complication (HCC)    type 2   HTN (hypertension) 10/28/2012   Hypercholesteremia    Low testosterone    Metabolic syndrome    Mitral valve regurgitation    Myocardial infarction (HCC)    Obesity    OSA on CPAP     Past Surgical History:  Procedure Laterality Date   COLONOSCOPY     SKIN SURGERY     Mohs   VENTRAL HERNIA REPAIR N/A 01/11/2020   Procedure: OPEN REPAIR VENTRAL HERNIA WITH MESH PATCH;  Surgeon: Darnell Level, MD;  Location: [facility];  Service: General;  Laterality: N/A;  LMA    Family History  Problem Relation Age of Onset   Dementia Mother    Allergic rhinitis Mother    Heart disease Father    Stroke Father    Allergic rhinitis Sister    Asthma Neg Hx    Eczema Neg Hx    Urticaria Neg Hx     Social History Social History   Tobacco Use   Smoking status: Former    Current packs/day: 0.00    Average packs/day: 1 pack/day for 40.7 years (40.7 ttl pk-yrs)    Types: Cigarettes  Start date: 49    Quit date: 03/05/2009    Years since quitting: 14.3   Smokeless tobacco: Never  Vaping Use   Vaping status: Never Used  Substance Use Topics   Alcohol use: Yes    Comment: Occasional   Drug use: Never    Current Outpatient Medications  Medication Sig Dispense Refill   aspirin EC 81 MG tablet Take 1 tablet (81 mg total) by mouth daily. 90 tablet 3   Evolocumab (REPATHA SURECLICK) 140 MG/ML SOAJ Inject 140 mg into the skin every 14 (fourteen) days. 2 mL 11   halobetasol (ULTRAVATE) 0.05 % ointment Apply 1 application topically daily as needed. (contact dermatitis)     ibuprofen (ADVIL) 200 MG tablet Take 200-400 mg by mouth daily as needed for headache or moderate pain.     lisinopril-hydrochlorothiazide (ZESTORETIC) 20-12.5 MG tablet Take 1 tablet by mouth daily. 90 tablet 3   metFORMIN (GLUCOPHAGE) 500 MG tablet Take 500 mg by mouth daily with breakfast.     nitroGLYCERIN (NITROSTAT) 0.4 MG SL tablet Place 1 tablet (0.4 mg total) under the tongue every 5  (five) minutes as needed for chest pain. 25 tablet 5   sodium chloride (OCEAN) 0.65 % SOLN nasal spray Place 2 sprays into both nostrils in the morning and at bedtime. (Patient taking differently: Place 2 sprays into both nostrils in the morning and at bedtime. As needed)  0   No current facility-administered medications for this visit.   Facility-Administered Medications Ordered in Other Visits  Medication Dose Route Frequency Provider Last Rate Last Admin   regadenoson (LEXISCAN) injection SOLN 0.4 mg  0.4 mg Intravenous Once Nahser, Deloris Ping, MD        Allergies  Allergen Reactions   Rosuvastatin     Elevated CK    Review of Systems  Constitutional:  Negative for activity change, fatigue and unexpected weight change (Has lost 20 pounds in 6 months intentionally).  HENT:  Negative for trouble swallowing and voice change.   Respiratory:  Negative for cough, shortness of breath and wheezing.   Cardiovascular:  Negative for chest pain, palpitations and leg swelling.  Gastrointestinal:  Negative for abdominal distention and abdominal pain.  Genitourinary:  Negative for difficulty urinating and dysuria.  Musculoskeletal:  Positive for arthralgias.  Neurological:  Negative for syncope and weakness.  Hematological:  Negative for adenopathy. Does not bruise/bleed easily.    BP 139/79   Pulse 70   Resp 18   Ht 5\' 10"  (1.778 m)   Wt 246 lb (111.6 kg)   SpO2 97% Comment: RA  BMI 35.30 kg/m  Physical Exam Vitals reviewed.  Constitutional:      General: He is not in acute distress.    Appearance: Normal appearance.  HENT:     Head: Normocephalic and atraumatic.  Eyes:     General: No scleral icterus.    Extraocular Movements: Extraocular movements intact.  Cardiovascular:     Rate and Rhythm: Normal rate and regular rhythm.     Heart sounds: Murmur (2/6 systolic) heard.  Pulmonary:     Effort: No respiratory distress.     Breath sounds: Normal breath sounds. No wheezing or  rales.  Abdominal:     General: There is no distension.     Palpations: Abdomen is soft.  Skin:    General: Skin is warm and dry.  Neurological:     General: No focal deficit present.     Mental Status: He is alert and  oriented to person, place, and time.     Cranial Nerves: No cranial nerve deficit.     Motor: No weakness.     Diagnostic Tests: CT CHEST WITHOUT CONTRAST   TECHNIQUE: Multidetector CT imaging of the chest was performed following the standard protocol without intravenous contrast. High resolution imaging of the lungs, as well as inspiratory and expiratory imaging, was performed.   RADIATION DOSE REDUCTION: This exam was performed according to the departmental dose-optimization program which includes automated exposure control, adjustment of the mA and/or kV according to patient size and/or use of iterative reconstruction technique.   COMPARISON:  09/06/2022, 09/04/2021, 07/18/2021, 07/18/2020.   FINDINGS: Cardiovascular: Atherosclerotic calcification of the aorta, aortic valve and coronary arteries. Heart is enlarged. No pericardial effusion.   Mediastinum/Nodes: No pathologically enlarged mediastinal or axillary lymph nodes. Hilar regions are difficult to definitively evaluate without IV contrast. Esophagus is grossly unremarkable.   Lungs/Pleura: Peripheral and basilar coarsened ground-glass, traction bronchiectasis/bronchiolectasis and subpleural reticular densities, unchanged from 09/06/2022 and likely slightly progressive from 07/18/2020. There is air trapping. No pleural fluid. Airway is unremarkable.   Upper Abdomen: Liver appears mildly heterogeneous. Gallstone. Subcentimeter exophytic lesion off the anterior left kidney, too small to characterize. No specific follow-up necessary. Visualized portions of the liver, gallbladder, adrenal glands, kidneys, spleen, pancreas, stomach and bowel are otherwise grossly unremarkable. No upper abdominal  adenopathy.   Musculoskeletal: Degenerative changes in the spine.   IMPRESSION: 1. Pulmonary parenchymal pattern of fibrosis, as detailed above, with air trapping, unchanged from 09/06/2022 and progressive from 07/18/2020. 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 Findings are suggestive of an alternative diagnosis (not UIP) per consensus guidelines: Diagnosis of Idiopathic Pulmonary Fibrosis: An Official ATS/ERS/JRS/ALAT Clinical Practice Guideline. Am Rosezetta Schlatter Crit Care Med Vol 198, Iss 5, [phone], Feb 09 2017. 2. Liver appears mildly steatotic. 3. Cholelithiasis. 4. Aortic atherosclerosis (ICD10-I70.0). Coronary artery calcification.     Electronically Signed   By: Leanna Battles M.D.   On: 05/30/2023 15:19 I personally reviewed the CT images.  There are extensive groundglass opacities and scarring in the lungs.  Coronary and aortic atherosclerosis.  Pulmonary function testing FVC 3.21 (73%) FEV1 2.79 (86%) DLCO 20.32 (78%)  Impression: Steven Mckee is a 73 year old man with a past history significant for CAD, MI, hypertension, hyperlipidemia, type 2 diabetes, mitral regurgitation, obstructive sleep apnea, obesity, remote tobacco use, and interstitial lung disease.    ILD-not UIP by CT criteria.  Denies any symptoms related to this.  CT shows progression of changes compared to a couple of years ago.  Discussed the proposed surgical procedure of a robotic assisted lung biopsy with Steven Mckee and his wife.  I informed them of the general nature of the procedure including the need for general anesthesia, the incisions to be used, the use of the surgical robot, the use of a drainage tube postoperatively, the expected hospital stay, and the overall recovery.  He is aware that this is diagnostic and not therapeutic.  I informed him of the indications, risks, benefits, and alternatives.  He understands the risks include, but not limited to death, MI,  DVT, PE, bleeding, possible need for transfusion, infection, prolonged air leak, cardiac arrhythmias, as well as possibility of other unforeseeable complications.  He is not certain that he wants to proceed with a biopsy.  He wants to think it over and may talk it over further with Dr. Marchelle Gearing before making a decision.  He [place] let us know if he would like  to proceed.  CAD-remote MI.  [place].  It has been followed by cardiology.  Originally Dr. Katrinka Blazing and now Dr. Anne Fu.  Not having any anginal symptoms.  Moderate mitral regurgitation-followed by Dr. Anne Fu.  EF normal by echo.  Plan: Patient Siddiq call if he wants to proceed with a robotic surgical lung biopsy.  Loreli Slot, MD [facility] [phone]

## 2023-08-05 ENCOUNTER — Other Ambulatory Visit: Payer: Self-pay | Admitting: Cardiology

## 2023-09-11 DIAGNOSIS — L570 Actinic keratosis: Secondary | ICD-10-CM | POA: Diagnosis not present

## 2023-10-16 DIAGNOSIS — L570 Actinic keratosis: Secondary | ICD-10-CM | POA: Diagnosis not present

## 2023-10-16 DIAGNOSIS — D044 Carcinoma in situ of skin of scalp and neck: Secondary | ICD-10-CM | POA: Diagnosis not present

## 2023-11-29 DIAGNOSIS — E7800 Pure hypercholesterolemia, unspecified: Secondary | ICD-10-CM | POA: Diagnosis not present

## 2023-11-29 DIAGNOSIS — G4700 Insomnia, unspecified: Secondary | ICD-10-CM | POA: Diagnosis not present

## 2023-11-29 DIAGNOSIS — I2510 Atherosclerotic heart disease of native coronary artery without angina pectoris: Secondary | ICD-10-CM | POA: Diagnosis not present

## 2023-11-29 DIAGNOSIS — I10 Essential (primary) hypertension: Secondary | ICD-10-CM | POA: Diagnosis not present

## 2023-11-29 DIAGNOSIS — E1136 Type 2 diabetes mellitus with diabetic cataract: Secondary | ICD-10-CM | POA: Diagnosis not present

## 2023-12-13 ENCOUNTER — Other Ambulatory Visit: Payer: Self-pay | Admitting: Cardiology

## 2023-12-16 ENCOUNTER — Other Ambulatory Visit: Payer: Self-pay | Admitting: Cardiology

## 2023-12-31 ENCOUNTER — Encounter: Payer: Self-pay | Admitting: Podiatry

## 2023-12-31 ENCOUNTER — Ambulatory Visit (INDEPENDENT_AMBULATORY_CARE_PROVIDER_SITE_OTHER): Admitting: Podiatry

## 2023-12-31 ENCOUNTER — Ambulatory Visit

## 2023-12-31 DIAGNOSIS — Z87898 Personal history of other specified conditions: Secondary | ICD-10-CM | POA: Insufficient documentation

## 2023-12-31 DIAGNOSIS — R2689 Other abnormalities of gait and mobility: Secondary | ICD-10-CM

## 2023-12-31 DIAGNOSIS — L400 Psoriasis vulgaris: Secondary | ICD-10-CM | POA: Insufficient documentation

## 2023-12-31 DIAGNOSIS — M722 Plantar fascial fibromatosis: Secondary | ICD-10-CM

## 2023-12-31 DIAGNOSIS — C44719 Basal cell carcinoma of skin of left lower limb, including hip: Secondary | ICD-10-CM | POA: Insufficient documentation

## 2023-12-31 DIAGNOSIS — R2681 Unsteadiness on feet: Secondary | ICD-10-CM

## 2023-12-31 DIAGNOSIS — L821 Other seborrheic keratosis: Secondary | ICD-10-CM | POA: Insufficient documentation

## 2023-12-31 DIAGNOSIS — L814 Other melanin hyperpigmentation: Secondary | ICD-10-CM | POA: Insufficient documentation

## 2023-12-31 DIAGNOSIS — D225 Melanocytic nevi of trunk: Secondary | ICD-10-CM | POA: Insufficient documentation

## 2023-12-31 DIAGNOSIS — Z85828 Personal history of other malignant neoplasm of skin: Secondary | ICD-10-CM | POA: Insufficient documentation

## 2023-12-31 NOTE — Progress Notes (Signed)
 Subjective:  Patient ID: Steven Mckee, male    DOB: 09/21/1950,  MRN: [id number] HPI Chief Complaint  Patient presents with   Foot Pain    Patient states about 10 years ago he was having issues with numbness and thought had neuropathy-neurologist said no, well, eventually the numbness went away, then going to gym more frequently and developed PF, but that has now went away as well, notice shoe size has increased and thinks arches have fallen, tries to keep shoes on at all times and that has helped, no issues today, but wanted recommendations on foot health   New Patient (Initial Visit)    73 y.o. male presents with the above complaint.   ROS: Denies fever chills nausea mobic muscle aches pains calf pain back pain chest pain shortness of breath.  Does relate that he is starting to get dizziness and loss of balance when Romberg's test.  Past Medical History:  Diagnosis Date   Angio-edema    CAD (coronary artery disease)    Diabetes mellitus without complication (HCC)    type 2   HTN (hypertension) 10/28/2012   Hypercholesteremia    Low testosterone    Metabolic syndrome    Mitral valve regurgitation    Myocardial infarction (HCC)    Obesity    OSA on CPAP    Past Surgical History:  Procedure Laterality Date   COLONOSCOPY     SKIN SURGERY     Mohs   VENTRAL HERNIA REPAIR N/A 01/11/2020   Procedure: OPEN REPAIR VENTRAL HERNIA WITH MESH PATCH;  Surgeon: Eletha Boas, MD;  Location: [facility];  Service: General;  Laterality: N/A;  LMA    Current Outpatient Medications:    aspirin  EC 81 MG tablet, Take 1 tablet (81 mg total) by mouth daily., Disp: 90 tablet, Rfl: 3   halobetasol (ULTRAVATE) 0.05 % ointment, Apply 1 application topically daily as needed. (contact dermatitis), Disp: , Rfl:    ibuprofen (ADVIL) 200 MG tablet, Take 200-400 mg by mouth daily as needed for headache or moderate pain., Disp: , Rfl:    lisinopril -hydrochlorothiazide  (ZESTORETIC ) 20-12.5 MG tablet, Take 1  tablet by mouth daily. Patient must schedule annual appointment for further refills first attempt, Disp: 30 tablet, Rfl: 0   metFORMIN (GLUCOPHAGE) 500 MG tablet, Take 500 mg by mouth daily with breakfast., Disp: , Rfl:    nitroGLYCERIN  (NITROSTAT ) 0.4 MG SL tablet, Place 1 tablet (0.4 mg total) under the tongue every 5 (five) minutes as needed for chest pain., Disp: 25 tablet, Rfl: 5   REPATHA  SURECLICK 140 MG/ML SOAJ, ADMINISTER 1 ML UNDER THE SKIN EVERY 14 DAYS, Disp: 2 mL, Rfl: 11   sodium chloride  (OCEAN) 0.65 % SOLN nasal spray, Place 2 sprays into both nostrils in the morning and at bedtime. (Patient taking differently: Place 2 sprays into both nostrils in the morning and at bedtime. As needed), Disp: , Rfl: 0 No current facility-administered medications for this visit.  Facility-Administered Medications Ordered in Other Visits:    regadenoson  (LEXISCAN ) injection SOLN 0.4 mg, 0.4 mg, Intravenous, Once, Nahser, Aleene PARAS, MD  No Known Allergies Review of Systems Objective:  There were no vitals filed for this visit.  General: Well developed, nourished, in no acute distress, alert and oriented x3   Dermatological: Skin is warm, dry and supple bilateral. Nails x 10 are well maintained; remaining integument appears unremarkable at this time. There are no open sores, no preulcerative lesions, no rash or signs of infection present.  Vascular: Dorsalis  Pedis artery and Posterior Tibial artery pedal pulses are 2/4 bilateral with immedate capillary fill time. Pedal hair growth present. No varicosities and mild lower extremity edema present bilateral.  Possibly associated with mitral regurg.  Neruologic: Grossly intact via light touch bilateral. Vibratory intact via tuning fork bilateral. Protective threshold with Semmes Wienstein monofilament intact to all pedal sites bilateral. Patellar and Achilles deep tendon reflexes 2+ bilateral. No Babinski or clonus noted bilateral.   Musculoskeletal: No  gross boney pedal deformities bilateral. No pain, crepitus, or limitation noted with foot and ankle range of motion bilateral. Muscular strength 5/5 in all groups tested bilateral.  Hammertoe deformities bilateral flexible in nature.  Gait: Unassisted, Nonantalgic.    Radiographs:  None taken  Assessment & Plan:   Assessment: History of plantar fasciitis.  Plan: We did discuss possibly following up with physical therapy for balance and gait training.  We discussed appropriate shoe gear stretching exercises.  Keeping moisturized skin.  Watch for open wounds or sores.     Rainen Vanrossum T. [place], [place]

## 2024-01-03 ENCOUNTER — Encounter (INDEPENDENT_AMBULATORY_CARE_PROVIDER_SITE_OTHER): Payer: Self-pay | Admitting: Cardiology

## 2024-01-03 DIAGNOSIS — I10 Essential (primary) hypertension: Secondary | ICD-10-CM

## 2024-01-12 ENCOUNTER — Other Ambulatory Visit: Payer: Self-pay | Admitting: Cardiology

## 2024-01-13 NOTE — Telephone Encounter (Signed)
Please see the MyChart message reply(ies) for my assessment and plan.    This patient gave consent for this Medical Advice Message and is aware that it may result in a bill to their insurance company, as well as the possibility of receiving a bill for a co-payment or deductible. They are an established patient, but are not seeking medical advice exclusively about a problem treated during an in person or video visit in the last seven days. I did not recommend an in person or video visit within seven days of my reply.    I spent a total of 5 minutes cumulative time within 7 days through MyChart messaging.  Mark Skains, MD   

## 2024-01-15 NOTE — Telephone Encounter (Signed)
 Pt has reviewed instructions/order to d/c Lisinopril /hydrochlorothiazide  per Dr Jeffrie.  Unable to remove medication from pt's medication list in this encounter however as it was re-ordered 01/13/2024 by Lovey Pyles, CMA.

## 2024-02-04 ENCOUNTER — Other Ambulatory Visit: Payer: Self-pay

## 2024-02-04 ENCOUNTER — Ambulatory Visit: Attending: Podiatry

## 2024-02-04 DIAGNOSIS — M79672 Pain in left foot: Secondary | ICD-10-CM | POA: Insufficient documentation

## 2024-02-04 DIAGNOSIS — R2689 Other abnormalities of gait and mobility: Secondary | ICD-10-CM | POA: Insufficient documentation

## 2024-02-04 DIAGNOSIS — R2681 Unsteadiness on feet: Secondary | ICD-10-CM | POA: Insufficient documentation

## 2024-02-04 DIAGNOSIS — M79671 Pain in right foot: Secondary | ICD-10-CM | POA: Insufficient documentation

## 2024-02-04 NOTE — Therapy (Signed)
 OUTPATIENT PHYSICAL THERAPY LOWER EXTREMITY EVALUATION   Patient Name: Steven Mckee MRN: [id number] DOB:October 05, 1950, 73 y.o., male Today's Date: 02/05/2024  END OF SESSION:  PT End of Session - 02/05/24 0944     Visit Number 1    Number of Visits 9    Date for PT Re-Evaluation 03/31/24    Authorization Type [employer] Medicare    PT Start Time 1016    PT Stop Time 1057    PT Time Calculation (min) 41 min    Activity Tolerance Patient tolerated treatment well    Behavior During Therapy WFL for tasks assessed/performed          Past Medical History:  Diagnosis Date   Angio-edema    CAD (coronary artery disease)    Diabetes mellitus without complication (HCC)    type 2   HTN (hypertension) 10/28/2012   Hypercholesteremia    Low testosterone    Metabolic syndrome    Mitral valve regurgitation    Myocardial infarction (HCC)    Obesity    OSA on CPAP    Past Surgical History:  Procedure Laterality Date   COLONOSCOPY     SKIN SURGERY     Mohs   VENTRAL HERNIA REPAIR N/A 01/11/2020   Procedure: OPEN REPAIR VENTRAL HERNIA WITH MESH PATCH;  Surgeon: Eletha Boas, MD;  Location: [facility];  Service: General;  Laterality: N/A;  LMA   Patient Active Problem List   Diagnosis Date Noted   Basal cell carcinoma (BCC) of left thigh 12/31/2023   History of malignant neoplasm of skin 12/31/2023   History of neoplasm 12/31/2023   Lentigo 12/31/2023   Melanocytic nevus of trunk 12/31/2023   Plaque psoriasis 12/31/2023   SK (seborrheic keratosis) 12/31/2023   Vasomotor rhinitis 03/13/2022   Nocturia 03/09/2021   Other allergic rhinitis 10/20/2020   Umbilical hernia 01/10/2020   Ventral hernia 01/10/2020   Rectus diastasis 01/10/2020   Mitral valvular regurgitation 03/02/2014   Hyperlipidemia 03/02/2014   Coronary artery disease involving native coronary artery of native heart with angina pectoris (HCC)    Metabolic syndrome    HTN (hypertension) 10/28/2012   Obesity, unspecified  10/28/2012    PCP: Dayna Motto, DO   REFERRING PROVIDER: Verta Royden DASEN, DPM  REFERRING DIAG:  R26.81 (ICD-10-CM) - Unsteady gait R26.89 (ICD-10-CM) - Balance problem  THERAPY DIAG:  Unsteadiness on feet  Pain in left foot  Pain in right foot  Rationale for Evaluation and Treatment: Rehabilitation  ONSET DATE: Chronic  SUBJECTIVE:   SUBJECTIVE STATEMENT: Pt presents to PT with reports of chronic plantar fascitis symptoms bilaterally but notes that they have been slowly getting better. Most pain is with first step in the morning. His main compliant or worry is that he has lost his balance a few times over the last few months, with the biggest instance being when he closed his eyes while showering. Has not fallen but wants to make sure his balance is good for the future as well. Has hx of peripheral neuropathy but the severity has decreased slightly. He feels his DM II is better managed now.   PERTINENT HISTORY: DM II, Peripheral neuropathy, MI  PAIN:  Are you having pain?  Yes: NPRS scale: 0/10 Worst: 2/10 Pain location: bilateral plantar fascia Pain description: tight, sore Aggravating factors: first step in morning Relieving factors: walking  PRECAUTIONS: None  RED FLAGS: None   WEIGHT BEARING RESTRICTIONS: No  FALLS:  Has patient fallen in last 6 months? No  LIVING  ENVIRONMENT: Lives with: lives with their family Lives in: House/apartment  OCCUPATION: Retired  PLOF: Independent  PATIENT GOALS: improve balance, prevent falls and decrease foot pain  NEXT MD VISIT: PRN  OBJECTIVE:  Note: Objective measures were completed at Evaluation unless otherwise noted.  DIAGNOSTIC FINDINGS: N/A  PATIENT SURVEYS:  PSFS: THE PATIENT SPECIFIC FUNCTIONAL SCALE  Place score of 0-10 (0 = unable to perform activity and 10 = able to perform activity at the same level as before injury or problem)  Activity Date: 02/04/2024    Walking without shoes 5    2.closing  eyes in shower 9    3.     4.      Total Score 7      Total Score = Sum of activity scores/number of activities  Minimally Detectable Change: 3 points (for single activity); 2 points (for average score)  [facility] (nd). The Patient Specific Functional Scale . Retrieved from [email or web address]   COGNITION: Overall cognitive status: Within functional limits for tasks assessed     SENSATION: Light touch: Impaired - bilateral feet  POSTURE: No Significant postural limitations  PALPATION: Slight TTP to bilateral heel  LOWER EXTREMITY ROM:  Active ROM Right eval Left eval  Hip flexion    Hip extension    Hip abduction    Hip adduction    Hip internal rotation    Hip external rotation    Knee flexion    Knee extension    Ankle dorsiflexion 5 9  Ankle plantarflexion    Ankle inversion    Ankle eversion     (Blank rows = not tested)  LOWER EXTREMITY MMT:  MMT Right eval Left eval  Hip flexion    Hip extension    Hip abduction    Hip adduction    Hip internal rotation    Hip external rotation    Knee flexion    Knee extension    Ankle dorsiflexion 5 5  Ankle plantarflexion 5 5  Ankle inversion 5 5  Ankle eversion 5 5   (Blank rows = not tested)   FUNCTIONAL TESTS:  MCTSIB: Condition 1: Avg of 3 trials: 30 sec, Condition 2: Avg of 3 trials: 30 sec, Condition 3: Avg of 3 trials: 30 sec, Condition 4: Avg of 3 trials: 14 sec, and Total Score: 104/120  GAIT: Distance walked: 88ft Assistive device utilized: None Level of assistance: Complete Independence Comments: slight decrease in heel strike   TREATMENT: [facility] Adult PT Treatment:                                                DATE: 02/04/2024 Toe yoga great/lesser toe ext x 10 ea Standing gastroc stretch x 30 ea SLS x 30 ea Tandem stance x 30 ea FT EC x 30   PATIENT EDUCATION:  Education details: eval findings, PSFS, HEP,  POC Person educated: Patient Education method: Explanation, Demonstration, and Handouts Education comprehension: verbalized understanding and returned demonstration  HOME EXERCISE PROGRAM: Access Code: [email or web address] URL: [email or web address] Date: 02/04/2024 Prepared by: Steven Mckee  Exercises - Toe Yoga - Alternating Great Toe and Lesser Toe Extension  - 1 x daily - 7 x weekly - 2 sets - 10 reps - Gastroc Stretch on Wall  - 1 x daily - 7 x weekly - 3 reps - 30 sec hold -  Seated Plantar Fascia Mobilization with Small Ball  - 1 x daily - 7 x weekly - 2 min hold - Single Leg Stance  - 1 x daily - 7 x weekly - 3 sets - 10 reps - Tandem Stance  - 1 x daily - 7 x weekly - 2-3 sets - 30 sec hold - Romberg Stance with Eyes Closed  - 1 x daily - 7 x weekly - 2-3 sets - 30 sec hold  ASSESSMENT:  CLINICAL IMPRESSION: Patient is a 73 y.o. M who was seen today for physical therapy evaluation and treatment for bilateral foot pain and sensory balance deficits. Physical findings are consistent with subjective and referring provider impression as pt has decrease in calf length most likely secondary to chronic plantar fascitis and appears to be very visually reliant for balance. He would benefit from skilled PT services working on improving foot intrinsics and calf length as well as improving sensory balance with particular emphasis on vestibular training.   OBJECTIVE IMPAIRMENTS: decreased activity tolerance, decreased balance, decreased mobility, and decreased ROM  ACTIVITY LIMITATIONS: standing, squatting, and hygiene/grooming  PARTICIPATION LIMITATIONS: shopping, community activity, and yard work  PERSONAL FACTORS: Time since onset of injury/illness/exacerbation and 3+ comorbidities: DM II, Peripheral neuropathy, MI are also affecting patient's functional outcome.   REHAB POTENTIAL: Good  CLINICAL DECISION MAKING: Evolving/moderate complexity  EVALUATION COMPLEXITY:  Moderate   GOALS: Goals reviewed with patient? No  SHORT TERM GOALS: Target date: 02/25/2024   Pt [place] be compliant and knowledgeable with initial HEP for improved comfort and carryover Baseline: initial HEP given  Goal status: INITIAL  2.  Pt [place] self report bilateral heel pain no greater than 0/10 for improved comfort and functional ability Baseline: 3/10 at worst Goal status: INITIAL   LONG TERM GOALS: Target date: 03/31/2024   Pt [place] improve PSFS by 2 points to 9 for improved functional ability and balance with desired activities  Baseline: 7 Goal status: INITIAL  2.  Pt [place] improve condition 4 on mCTSIB to at least 30 seconds on average of 3 trials for improved sensory balance and overall stability Baseline: 14 sec Goal status: INITIAL  3.  Pt [place] improve bilateral calf length to at least 10 degrees for improved mobility and decreased pain  Baseline: see chart Goal status: INITIAL  4.  Pt [place] be compliant and knowledgeable with advanced HEP for improved comfort and carryover post discharge from therapy Baseline: N/A  Goal status: INITIAL   PLAN:  PT FREQUENCY: 1x/week  PT DURATION: 8 weeks  PLANNED INTERVENTIONS: [id number]- PT Re-evaluation, 97110-Therapeutic exercises, 97530- Therapeutic activity, [id number]- Neuromuscular re-education, 97535- Self Care, [id number]- Manual therapy, [id number]- Gait training, [id number]- Electrical stimulation (unattended), [id number]- Electrical stimulation (manual), 20560 (1-2 muscles), 20561 (3+ muscles)- Dry Needling, Cryotherapy, and Moist heat  PLAN FOR NEXT SESSION: assess HEP response, calf stretching, sensory balance training    Steven Mckee, PT 02/05/2024, 10:08 AM

## 2024-02-11 ENCOUNTER — Ambulatory Visit: Attending: Podiatry

## 2024-02-11 DIAGNOSIS — R2681 Unsteadiness on feet: Secondary | ICD-10-CM | POA: Diagnosis not present

## 2024-02-11 DIAGNOSIS — M79672 Pain in left foot: Secondary | ICD-10-CM | POA: Insufficient documentation

## 2024-02-11 DIAGNOSIS — M79671 Pain in right foot: Secondary | ICD-10-CM | POA: Diagnosis not present

## 2024-02-11 NOTE — Therapy (Signed)
 OUTPATIENT PHYSICAL THERAPY TREATMENT   Patient Name: Steven Mckee MRN: [id number] DOB:1950-10-12, 73 y.o., male Today's Date: 02/11/2024  END OF SESSION:  PT End of Session - 02/11/24 0927     Visit Number 2    Number of Visits 9    Date for PT Re-Evaluation 03/31/24    Authorization Type [employer] Medicare    PT Start Time 0930    PT Stop Time 1010    PT Time Calculation (min) 40 min    Activity Tolerance Patient tolerated treatment well    Behavior During Therapy WFL for tasks assessed/performed           Past Medical History:  Diagnosis Date   Angio-edema    CAD (coronary artery disease)    Diabetes mellitus without complication (HCC)    type 2   HTN (hypertension) 10/28/2012   Hypercholesteremia    Low testosterone    Metabolic syndrome    Mitral valve regurgitation    Myocardial infarction (HCC)    Obesity    OSA on CPAP    Past Surgical History:  Procedure Laterality Date   COLONOSCOPY     SKIN SURGERY     Mohs   VENTRAL HERNIA REPAIR N/A 01/11/2020   Procedure: OPEN REPAIR VENTRAL HERNIA WITH MESH PATCH;  Surgeon: Eletha Boas, MD;  Location: [facility];  Service: General;  Laterality: N/A;  LMA   Patient Active Problem List   Diagnosis Date Noted   Basal cell carcinoma (BCC) of left thigh 12/31/2023   History of malignant neoplasm of skin 12/31/2023   History of neoplasm 12/31/2023   Lentigo 12/31/2023   Melanocytic nevus of trunk 12/31/2023   Plaque psoriasis 12/31/2023   SK (seborrheic keratosis) 12/31/2023   Vasomotor rhinitis 03/13/2022   Nocturia 03/09/2021   Other allergic rhinitis 10/20/2020   Umbilical hernia 01/10/2020   Ventral hernia 01/10/2020   Rectus diastasis 01/10/2020   Mitral valvular regurgitation 03/02/2014   Hyperlipidemia 03/02/2014   Coronary artery disease involving native coronary artery of native heart with angina pectoris (HCC)    Metabolic syndrome    HTN (hypertension) 10/28/2012   Obesity, unspecified 10/28/2012     PCP: Dayna Motto, DO   REFERRING PROVIDER: Verta Royden DASEN, DPM  REFERRING DIAG:  R26.81 (ICD-10-CM) - Unsteady gait R26.89 (ICD-10-CM) - Balance problem  THERAPY DIAG:  Unsteadiness on feet  Pain in left foot  Pain in right foot  Rationale for Evaluation and Treatment: Rehabilitation  ONSET DATE: Chronic  SUBJECTIVE:   SUBJECTIVE STATEMENT: Pt presents to PT with reports of no current pain. Has been compliant with HEP with no adverse effect  EVAL: Pt presents to PT with reports of chronic plantar fascitis symptoms bilaterally but notes that they have been slowly getting better. Most pain is with first step in the morning. His main compliant or worry is that he has lost his balance a few times over the last few months, with the biggest instance being when he closed his eyes while showering. Has not fallen but wants to make sure his balance is good for the future as well. Has hx of peripheral neuropathy but the severity has decreased slightly. He feels his DM II is better managed now.   PERTINENT HISTORY: DM II, Peripheral neuropathy, MI  PAIN:  Are you having pain?  Yes: NPRS scale: 0/10 Worst: 2/10 Pain location: bilateral plantar fascia Pain description: tight, sore Aggravating factors: first step in morning Relieving factors: walking  PRECAUTIONS: None  RED FLAGS:  None   WEIGHT BEARING RESTRICTIONS: No  FALLS:  Has patient fallen in last 6 months? No  LIVING ENVIRONMENT: Lives with: lives with their family Lives in: House/apartment  OCCUPATION: Retired  PLOF: Independent  PATIENT GOALS: improve balance, prevent falls and decrease foot pain  NEXT MD VISIT: PRN  OBJECTIVE:  Note: Objective measures were completed at Evaluation unless otherwise noted.  DIAGNOSTIC FINDINGS: N/A  PATIENT SURVEYS:  PSFS: THE PATIENT SPECIFIC FUNCTIONAL SCALE  Place score of 0-10 (0 = unable to perform activity and 10 = able to perform activity at the same level as  before injury or problem)  Activity Date: 02/04/2024    Walking without shoes 5    2.closing eyes in shower 9    3.     4.      Total Score 7      Total Score = Sum of activity scores/number of activities  Minimally Detectable Change: 3 points (for single activity); 2 points (for average score)  [name] (nd). The Patient Specific Functional Scale . Retrieved from [email or web address]   COGNITION: Overall cognitive status: Within functional limits for tasks assessed     SENSATION: Light touch: Impaired - bilateral feet  POSTURE: No Significant postural limitations  PALPATION: Slight TTP to bilateral heel  LOWER EXTREMITY ROM:  Active ROM Right eval Left eval  Hip flexion    Hip extension    Hip abduction    Hip adduction    Hip internal rotation    Hip external rotation    Knee flexion    Knee extension    Ankle dorsiflexion 5 9  Ankle plantarflexion    Ankle inversion    Ankle eversion     (Blank rows = not tested)  LOWER EXTREMITY MMT:  MMT Right eval Left eval  Hip flexion    Hip extension    Hip abduction    Hip adduction    Hip internal rotation    Hip external rotation    Knee flexion    Knee extension    Ankle dorsiflexion 5 5  Ankle plantarflexion 5 5  Ankle inversion 5 5  Ankle eversion 5 5   (Blank rows = not tested)   FUNCTIONAL TESTS:  MCTSIB: Condition 1: Avg of 3 trials: 30 sec, Condition 2: Avg of 3 trials: 30 sec, Condition 3: Avg of 3 trials: 30 sec, Condition 4: Avg of 3 trials: 14 sec, and Total Score: 104/120  GAIT: Distance walked: 100ft Assistive device utilized: None Level of assistance: Complete Independence Comments: slight decrease in heel strike   TREATMENT: [facility] Adult PT Treatment:                                                DATE: 02/11/2024 NuStep lvl 6 LE only x 4 min for functional activity tolerance Slant board gastroc stretch 2x30 Slant  board soleus stretch 2x30  Tandem stance on foam 2x30 ea Toe raises 2x20 Heel and toe walk on foam beam x 2 laps ea at [employer] fwd/bwd 2x60 FT EC on foam 2x30 Towel scrunch 2x45  Toe yoga great/lesser toe ext x 10 ea Heel raise with ball 2x10  [facility] Adult PT Treatment:  DATE: 02/04/2024 Toe yoga great/lesser toe ext x 10 ea Standing gastroc stretch x 30 ea SLS x 30 ea Tandem stance x 30 ea FT EC x 30   PATIENT EDUCATION:  Education details: eval findings, PSFS, HEP, POC Person educated: Patient Education method: Explanation, Demonstration, and Handouts Education comprehension: verbalized understanding and returned demonstration  HOME EXERCISE PROGRAM: Access Code: [email or web address] URL: [email or web address] Date: 02/04/2024 Prepared by: Alm Kingdom  Exercises - Toe Yoga - Alternating Great Toe and Lesser Toe Extension  - 1 x daily - 7 x weekly - 2 sets - 10 reps - Gastroc Stretch on Wall  - 1 x daily - 7 x weekly - 3 reps - 30 sec hold - Seated Plantar Fascia Mobilization with Small Ball  - 1 x daily - 7 x weekly - 2 min hold - Single Leg Stance  - 1 x daily - 7 x weekly - 3 sets - 10 reps - Tandem Stance  - 1 x daily - 7 x weekly - 2-3 sets - 30 sec hold - Romberg Stance with Eyes Closed  - 1 x daily - 7 x weekly - 2-3 sets - 30 sec hold  ASSESSMENT:  CLINICAL IMPRESSION: Pt was able to complete all prescribed exercises with no adverse effect. Exercises focused on calf strengthening, intrinsic strengthening, and improving sensory balance. He is progressing well with therapy thus far, Jas continue to be seen and progressed as able.   EVAL: Patient is a 73 y.o. M who was seen today for physical therapy evaluation and treatment for bilateral foot pain and sensory balance deficits. Physical findings are consistent with subjective and referring provider impression as pt has decrease in calf length most  likely secondary to chronic plantar fascitis and appears to be very visually reliant for balance. He would benefit from skilled PT services working on improving foot intrinsics and calf length as well as improving sensory balance with particular emphasis on vestibular training.   OBJECTIVE IMPAIRMENTS: decreased activity tolerance, decreased balance, decreased mobility, and decreased ROM  ACTIVITY LIMITATIONS: standing, squatting, and hygiene/grooming  PARTICIPATION LIMITATIONS: shopping, community activity, and yard work  PERSONAL FACTORS: Time since onset of injury/illness/exacerbation and 3+ comorbidities: DM II, Peripheral neuropathy, MI are also affecting patient's functional outcome.   REHAB POTENTIAL: Good  CLINICAL DECISION MAKING: Evolving/moderate complexity  EVALUATION COMPLEXITY: Moderate   GOALS: Goals reviewed with patient? No  SHORT TERM GOALS: Target date: 02/25/2024   Pt Avis be compliant and knowledgeable with initial HEP for improved comfort and carryover Baseline: initial HEP given  Goal status: MET  2.  Pt Tytus self report bilateral heel pain no greater than 0/10 for improved comfort and functional ability Baseline: 3/10 at worst Goal status: INITIAL   LONG TERM GOALS: Target date: 03/31/2024   Pt Cleotis improve PSFS by 2 points to 9 for improved functional ability and balance with desired activities  Baseline: 7 Goal status: INITIAL  2.  Pt Jadon improve condition 4 on mCTSIB to at least 30 seconds on average of 3 trials for improved sensory balance and overall stability Baseline: 14 sec Goal status: INITIAL  3.  Pt Amaru improve bilateral calf length to at least 10 degrees for improved mobility and decreased pain  Baseline: see chart Goal status: INITIAL  4.  Pt Leverett be compliant and knowledgeable with advanced HEP for improved comfort and carryover post discharge from therapy Baseline: N/A  Goal status: INITIAL   PLAN:  PT FREQUENCY: 1x/week  PT  DURATION:  8 weeks  PLANNED INTERVENTIONS: 97164- PT Re-evaluation, 97110-Therapeutic exercises, 97530- Therapeutic activity, [id number]- Neuromuscular re-education, 97535- Self Care, [id number]- Manual therapy, [id number]- Gait training, [phone]- Electrical stimulation (unattended), [id number]- Electrical stimulation (manual), [phone] (1-2 muscles), [id number] (3+ muscles)- Dry Needling, Cryotherapy, and Moist heat  PLAN FOR NEXT SESSION: assess HEP response, calf stretching, sensory balance training    Alm JAYSON Kingdom, PT 02/11/2024, 10:15 AM

## 2024-02-19 ENCOUNTER — Encounter: Admitting: Physical Therapy

## 2024-02-26 ENCOUNTER — Encounter: Payer: Self-pay | Admitting: Physical Therapy

## 2024-02-26 ENCOUNTER — Ambulatory Visit: Admitting: Physical Therapy

## 2024-02-26 DIAGNOSIS — R2681 Unsteadiness on feet: Secondary | ICD-10-CM | POA: Diagnosis not present

## 2024-02-26 DIAGNOSIS — M79672 Pain in left foot: Secondary | ICD-10-CM | POA: Diagnosis not present

## 2024-02-26 DIAGNOSIS — M79671 Pain in right foot: Secondary | ICD-10-CM

## 2024-02-26 NOTE — Therapy (Signed)
 OUTPATIENT PHYSICAL THERAPY TREATMENT   Patient Name: Steven Mckee MRN: [id number] DOB:1951-04-04, 73 y.o., male Today's Date: 02/26/2024  END OF SESSION:  PT End of Session - 02/26/24 1057     Visit Number 3    Number of Visits 9    Date for PT Re-Evaluation 03/31/24    Authorization Type [employer] Medicare    PT Start Time 0930    PT Stop Time 1015    PT Time Calculation (min) 45 min            Past Medical History:  Diagnosis Date   Angio-edema    CAD (coronary artery disease)    Diabetes mellitus without complication (HCC)    type 2   HTN (hypertension) 10/28/2012   Hypercholesteremia    Low testosterone    Metabolic syndrome    Mitral valve regurgitation    Myocardial infarction (HCC)    Obesity    OSA on CPAP    Past Surgical History:  Procedure Laterality Date   COLONOSCOPY     SKIN SURGERY     Mohs   VENTRAL HERNIA REPAIR N/A 01/11/2020   Procedure: OPEN REPAIR VENTRAL HERNIA WITH MESH PATCH;  Surgeon: Eletha Boas, MD;  Location: [facility];  Service: General;  Laterality: N/A;  LMA   Patient Active Problem List   Diagnosis Date Noted   Basal cell carcinoma (BCC) of left thigh 12/31/2023   History of malignant neoplasm of skin 12/31/2023   History of neoplasm 12/31/2023   Lentigo 12/31/2023   Melanocytic nevus of trunk 12/31/2023   Plaque psoriasis 12/31/2023   SK (seborrheic keratosis) 12/31/2023   Vasomotor rhinitis 03/13/2022   Nocturia 03/09/2021   Other allergic rhinitis 10/20/2020   Umbilical hernia 01/10/2020   Ventral hernia 01/10/2020   Rectus diastasis 01/10/2020   Mitral valvular regurgitation 03/02/2014   Hyperlipidemia 03/02/2014   Coronary artery disease involving native coronary artery of native heart with angina pectoris (HCC)    Metabolic syndrome    HTN (hypertension) 10/28/2012   Obesity, unspecified 10/28/2012    PCP: Dayna Motto, DO   REFERRING PROVIDER: Verta Royden DASEN, DPM  REFERRING DIAG:  R26.81 (ICD-10-CM) - Unsteady  gait R26.89 (ICD-10-CM) - Balance problem  THERAPY DIAG:  Unsteadiness on feet  Pain in left foot  Pain in right foot  Rationale for Evaluation and Treatment: Rehabilitation  ONSET DATE: Chronic  SUBJECTIVE:   SUBJECTIVE STATEMENT: Pt presents to PT with reports of no current pain. Has been compliant with HEP with no adverse effect  EVAL: Pt presents to PT with reports of chronic plantar fascitis symptoms bilaterally but notes that they have been slowly getting better. Most pain is with first step in the morning. His main compliant or worry is that he has lost his balance a few times over the last few months, with the biggest instance being when he closed his eyes while showering. Has not fallen but wants to make sure his balance is good for the future as well. Has hx of peripheral neuropathy but the severity has decreased slightly. He feels his DM II is better managed now.   PERTINENT HISTORY: DM II, Peripheral neuropathy, MI  PAIN:  Are you having pain?  Yes: NPRS scale: 0/10 Worst: 2/10 Pain location: bilateral plantar fascia Pain description: tight, sore Aggravating factors: first step in morning Relieving factors: walking  PRECAUTIONS: None  RED FLAGS: None   WEIGHT BEARING RESTRICTIONS: No  FALLS:  Has patient fallen in last 6 months? No  LIVING ENVIRONMENT: Lives with: lives with their family Lives in: House/apartment  OCCUPATION: Retired  PLOF: Independent  PATIENT GOALS: improve balance, prevent falls and decrease foot pain  NEXT MD VISIT: PRN  OBJECTIVE:  Note: Objective measures were completed at Evaluation unless otherwise noted.  DIAGNOSTIC FINDINGS: N/A  PATIENT SURVEYS:  PSFS: THE PATIENT SPECIFIC FUNCTIONAL SCALE  Place score of 0-10 (0 = unable to perform activity and 10 = able to perform activity at the same level as before injury or problem)  Activity Date: 02/04/2024    Walking without shoes 5    2.closing eyes in shower 9    3.      4.      Total Score 7      Total Score = Sum of activity scores/number of activities  Minimally Detectable Change: 3 points (for single activity); 2 points (for average score)  [name] (nd). The Patient Specific Functional Scale . Retrieved from [email or web address]   COGNITION: Overall cognitive status: Within functional limits for tasks assessed     SENSATION: Light touch: Impaired - bilateral feet  POSTURE: No Significant postural limitations  PALPATION: Slight TTP to bilateral heel  LOWER EXTREMITY ROM:  Active ROM Right eval Left eval  Hip flexion    Hip extension    Hip abduction    Hip adduction    Hip internal rotation    Hip external rotation    Knee flexion    Knee extension    Ankle dorsiflexion 5 9  Ankle plantarflexion    Ankle inversion    Ankle eversion     (Blank rows = not tested)  LOWER EXTREMITY MMT:  MMT Right eval Left eval  Hip flexion    Hip extension    Hip abduction    Hip adduction    Hip internal rotation    Hip external rotation    Knee flexion    Knee extension    Ankle dorsiflexion 5 5  Ankle plantarflexion 5 5  Ankle inversion 5 5  Ankle eversion 5 5   (Blank rows = not tested)   FUNCTIONAL TESTS:  MCTSIB: Condition 1: Avg of 3 trials: 30 sec, Condition 2: Avg of 3 trials: 30 sec, Condition 3: Avg of 3 trials: 30 sec, Condition 4: Avg of 3 trials: 14 sec, and Total Score: 104/120  GAIT: Distance walked: 56ft Assistive device utilized: None Level of assistance: Complete Independence Comments: slight decrease in heel strike   TREATMENT: [facility] Adult PT Treatment:                                                DATE: 02/26/24 Therapeutic Exercise: Heel raise 2 x 20 reps  Slant board gastroc and soleus stretches x 2 each  Seated hamstring stretch  x 2 each  Long sitting Plantar Fascia stretch x 60 sec each  Towel scrunch 2x45  Toe yoga  great/lesser toe ext x 10 ea Heel raise with ball 2x10 Updated HEP  Neuromuscular re-ed: Tandem stance with head turns , trials each foot back SLS Right 20 sec, Left <10 Blue rocker board A/P and lateral weight shifts with EC Narrow stance on [employer]ire[employer] Pad EC Heel raise on [employer] x 10 Gait with head turns and nods - no deviations      [facility] Adult PT Treatment:  DATE: 02/11/2024 NuStep lvl 6 LE only x 4 min for functional activity tolerance Slant board gastroc stretch 2x30 Slant board soleus stretch 2x30  Tandem stance on foam 2x30 ea Toe raises 2x20 Heel and toe walk on foam beam x 2 laps ea at [employer] fwd/bwd 2x60 FT EC on foam 2x30 Towel scrunch 2x45  Toe yoga great/lesser toe ext x 10 ea Heel raise with ball 2x10  [facility] Adult PT Treatment:                                                DATE: 02/04/2024 Toe yoga great/lesser toe ext x 10 ea Standing gastroc stretch x 30 ea SLS x 30 ea Tandem stance x 30 ea FT EC x 30   PATIENT EDUCATION:  Education details: eval findings, PSFS, HEP, POC Person educated: Patient Education method: Explanation, Demonstration, and Handouts Education comprehension: verbalized understanding and returned demonstration  HOME EXERCISE PROGRAM: Access Code: [email or web address] URL: [email or web address] Date: 02/04/2024 Prepared by: Alm Kingdom  Exercises - Toe Yoga - Alternating Great Toe and Lesser Toe Extension  - 1 x daily - 7 x weekly - 2 sets - 10 reps - Gastroc Stretch on Wall  - 1 x daily - 7 x weekly - 3 reps - 30 sec hold - Seated Plantar Fascia Mobilization with Small Ball  - 1 x daily - 7 x weekly - 2 min hold - Single Leg Stance  - 1 x daily - 7 x weekly - 3 sets - 10 reps - Tandem Stance  - 1 x daily - 7 x weekly - 2-3 sets - 30 sec hold - Romberg Stance with Eyes Closed  - 1 x daily - 7 x weekly - 2-3 sets - 30 sec hold - Seated Hamstring Stretch  - 1 x daily  - 7 x weekly - 1 sets - 3 reps - 30 hold - Tandem Stance with Head Rotation  - 1 x daily - 7 x weekly - 1 sets - 3 reps  ASSESSMENT:  CLINICAL IMPRESSION: Pt was able to complete all prescribed exercises with no adverse effect. Exercises focused on calf strengthening/lengthening, intrinsic strengthening, and improving sensory balance. He reports no pain on arrival and only has plantar discomfort when he is barefoot. Has been doing well since last visit 2 weeks ago.  He is progressing well with therapy thus far, Robinson continue to be seen and progressed as able.   EVAL: Patient is a 73 y.o. M who was seen today for physical therapy evaluation and treatment for bilateral foot pain and sensory balance deficits. Physical findings are consistent with subjective and referring provider impression as pt has decrease in calf length most likely secondary to chronic plantar fascitis and appears to be very visually reliant for balance. He would benefit from skilled PT services working on improving foot intrinsics and calf length as well as improving sensory balance with particular emphasis on vestibular training.   OBJECTIVE IMPAIRMENTS: decreased activity tolerance, decreased balance, decreased mobility, and decreased ROM  ACTIVITY LIMITATIONS: standing, squatting, and hygiene/grooming  PARTICIPATION LIMITATIONS: shopping, community activity, and yard work  PERSONAL FACTORS: Time since onset of injury/illness/exacerbation and 3+ comorbidities: DM II, Peripheral neuropathy, MI are also affecting patient's functional outcome.   REHAB POTENTIAL: Good  CLINICAL DECISION MAKING: Evolving/moderate complexity  EVALUATION COMPLEXITY: Moderate   GOALS: Goals  reviewed with patient? No  SHORT TERM GOALS: Target date: 02/25/2024   Pt Durward be compliant and knowledgeable with initial HEP for improved comfort and carryover Baseline: initial HEP given  Goal status: MET  2.  Pt Clanton self report bilateral heel  pain no greater than 0/10 for improved comfort and functional ability Baseline: 3/10 at worst 02/26/24: discomfort when barefoot  Goal status: ONGOING   LONG TERM GOALS: Target date: 03/31/2024   Pt Amor improve PSFS by 2 points to 9 for improved functional ability and balance with desired activities  Baseline: 7 Goal status: INITIAL  2.  Pt Laiden improve condition 4 on mCTSIB to at least 30 seconds on average of 3 trials for improved sensory balance and overall stability Baseline: 14 sec Goal status: INITIAL  3.  Pt Norvell improve bilateral calf length to at least 10 degrees for improved mobility and decreased pain  Baseline: see chart Goal status: INITIAL  4.  Pt Xai be compliant and knowledgeable with advanced HEP for improved comfort and carryover post discharge from therapy Baseline: N/A  Goal status: INITIAL   PLAN:  PT FREQUENCY: 1x/week  PT DURATION: 8 weeks  PLANNED INTERVENTIONS: 97164- PT Re-evaluation, 97110-Therapeutic exercises, 97530- Therapeutic activity, [id number]- Neuromuscular re-education, 97535- Self Care, 02859- Manual therapy, [id number]- Gait training, [phone]- Electrical stimulation (unattended), [phone]- Electrical stimulation (manual), 20560 (1-2 muscles), 20561 (3+ muscles)- Dry Needling, Cryotherapy, and Moist heat  PLAN FOR NEXT SESSION: assess HEP response, calf stretching, sensory balance training    Harlene Persons, PTA 02/26/24 10:58 AM Phone: [phone] Fax: [phone]

## 2024-03-02 DIAGNOSIS — L570 Actinic keratosis: Secondary | ICD-10-CM | POA: Diagnosis not present

## 2024-03-02 DIAGNOSIS — L409 Psoriasis, unspecified: Secondary | ICD-10-CM | POA: Diagnosis not present

## 2024-03-04 ENCOUNTER — Ambulatory Visit

## 2024-03-04 ENCOUNTER — Telehealth: Payer: Self-pay

## 2024-03-04 NOTE — Therapy (Incomplete)
 OUTPATIENT PHYSICAL THERAPY TREATMENT   Patient Name: Winner Valeriano MRN: [id number] DOB:Jun 11, 1951, 73 y.o., male Today's Date: 03/04/2024  END OF SESSION:      Past Medical History:  Diagnosis Date   Angio-edema    CAD (coronary artery disease)    Diabetes mellitus without complication (HCC)    type 2   HTN (hypertension) 10/28/2012   Hypercholesteremia    Low testosterone    Metabolic syndrome    Mitral valve regurgitation    Myocardial infarction (HCC)    Obesity    OSA on CPAP    Past Surgical History:  Procedure Laterality Date   COLONOSCOPY     SKIN SURGERY     Mohs   VENTRAL HERNIA REPAIR N/A 01/11/2020   Procedure: OPEN REPAIR VENTRAL HERNIA WITH MESH PATCH;  Surgeon: Eletha Boas, MD;  Location: [facility];  Service: General;  Laterality: N/A;  LMA   Patient Active Problem List   Diagnosis Date Noted   Basal cell carcinoma (BCC) of left thigh 12/31/2023   History of malignant neoplasm of skin 12/31/2023   History of neoplasm 12/31/2023   Lentigo 12/31/2023   Melanocytic nevus of trunk 12/31/2023   Plaque psoriasis 12/31/2023   SK (seborrheic keratosis) 12/31/2023   Vasomotor rhinitis 03/13/2022   Nocturia 03/09/2021   Other allergic rhinitis 10/20/2020   Umbilical hernia 01/10/2020   Ventral hernia 01/10/2020   Rectus diastasis 01/10/2020   Mitral valvular regurgitation 03/02/2014   Hyperlipidemia 03/02/2014   Coronary artery disease involving native coronary artery of native heart with angina pectoris    Metabolic syndrome    HTN (hypertension) 10/28/2012   Obesity, unspecified 10/28/2012    PCP: Dayna Motto, DO   REFERRING PROVIDER: Verta Royden DASEN, DPM  REFERRING DIAG:  R26.81 (ICD-10-CM) - Unsteady gait R26.89 (ICD-10-CM) - Balance problem  THERAPY DIAG:  No diagnosis found.  Rationale for Evaluation and Treatment: Rehabilitation  ONSET DATE: Chronic  SUBJECTIVE:   SUBJECTIVE STATEMENT: ***  EVAL: Pt presents to PT with reports of  chronic plantar fascitis symptoms bilaterally but notes that they have been slowly getting better. Most pain is with first step in the morning. His main compliant or worry is that he has lost his balance a few times over the last few months, with the biggest instance being when he closed his eyes while showering. Has not fallen but wants to make sure his balance is good for the future as well. Has hx of peripheral neuropathy but the severity has decreased slightly. He feels his DM II is better managed now.   PERTINENT HISTORY: DM II, Peripheral neuropathy, MI  PAIN:  Are you having pain?  Yes: NPRS scale: 0/10 Worst: 2/10 Pain location: bilateral plantar fascia Pain description: tight, sore Aggravating factors: first step in morning Relieving factors: walking  PRECAUTIONS: None  RED FLAGS: None   WEIGHT BEARING RESTRICTIONS: No  FALLS:  Has patient fallen in last 6 months? No  LIVING ENVIRONMENT: Lives with: lives with their family Lives in: House/apartment  OCCUPATION: Retired  PLOF: Independent  PATIENT GOALS: improve balance, prevent falls and decrease foot pain  NEXT MD VISIT: PRN  OBJECTIVE:  Note: Objective measures were completed at Evaluation unless otherwise noted.  DIAGNOSTIC FINDINGS: N/A  PATIENT SURVEYS:  PSFS: THE PATIENT SPECIFIC FUNCTIONAL SCALE  Place score of 0-10 (0 = unable to perform activity and 10 = able to perform activity at the same level as before injury or problem)  Activity Date: 02/04/2024  Walking without shoes 5    2.closing eyes in shower 9    3.     4.      Total Score 7      Total Score = Sum of activity scores/number of activities  Minimally Detectable Change: 3 points (for single activity); 2 points (for average score)  [name] (nd). The Patient Specific Functional Scale . Retrieved from [email or web address]   COGNITION: Overall cognitive status:  Within functional limits for tasks assessed     SENSATION: Light touch: Impaired - bilateral feet  POSTURE: No Significant postural limitations  PALPATION: Slight TTP to bilateral heel  LOWER EXTREMITY ROM:  Active ROM Right eval Left eval  Hip flexion    Hip extension    Hip abduction    Hip adduction    Hip internal rotation    Hip external rotation    Knee flexion    Knee extension    Ankle dorsiflexion 5 9  Ankle plantarflexion    Ankle inversion    Ankle eversion     (Blank rows = not tested)  LOWER EXTREMITY MMT:  MMT Right eval Left eval  Hip flexion    Hip extension    Hip abduction    Hip adduction    Hip internal rotation    Hip external rotation    Knee flexion    Knee extension    Ankle dorsiflexion 5 5  Ankle plantarflexion 5 5  Ankle inversion 5 5  Ankle eversion 5 5   (Blank rows = not tested)   FUNCTIONAL TESTS:  MCTSIB: Condition 1: Avg of 3 trials: 30 sec, Condition 2: Avg of 3 trials: 30 sec, Condition 3: Avg of 3 trials: 30 sec, Condition 4: Avg of 3 trials: 14 sec, and Total Score: 104/120  GAIT: Distance walked: 81ft Assistive device utilized: None Level of assistance: Complete Independence Comments: slight decrease in heel strike   TREATMENT: [facility] Adult PT Treatment:                                                DATE: 03/04/24 Therapeutic Exercise: Heel raise 2 x 20 reps  Slant board gastroc and soleus stretches x 2 each  Seated hamstring stretch  x 2 each  Long sitting Plantar Fascia stretch x 60 sec each  Towel scrunch 2x45  Toe yoga great/lesser toe ext x 10 ea Heel raise with ball 2x10 Updated HEP Neuromuscular re-ed: Tandem stance with head turns , trials each foot back SLS Right 20 sec, Left <10 Blue rocker board A/P and lateral weight shifts with EC Narrow stance on Airex Pad EC Heel raise on AIREX x 10 Gait with head turns and nods - no deviations  [facility] Adult PT Treatment:                                                 DATE: 02/26/24 Therapeutic Exercise: Heel raise 2 x 20 reps  Slant board gastroc and soleus stretches x 2 each  Seated hamstring stretch  x 2 each  Long sitting Plantar Fascia stretch x 60 sec each  Towel scrunch 2x45  Toe yoga great/lesser toe ext x 10 ea Heel raise with ball 2x10  Updated HEP Neuromuscular re-ed: Tandem stance with head turns , trials each foot back SLS Right 20 sec, Left <10 Blue rocker board A/P and lateral weight shifts with EC Narrow stance on Airex Pad EC Heel raise on AIREX x 10 Gait with head turns and nods - no deviations  [facility] Adult PT Treatment:                                                DATE: 02/11/2024 NuStep lvl 6 LE only x 4 min for functional activity tolerance Slant board gastroc stretch 2x30 Slant board soleus stretch 2x30  Tandem stance on foam 2x30 ea Toe raises 2x20 Heel and toe walk on foam beam x 2 laps ea at [employer] fwd/bwd 2x60 FT EC on foam 2x30 Towel scrunch 2x45  Toe yoga great/lesser toe ext x 10 ea Heel raise with ball 2x10  [facility] Adult PT Treatment:                                                DATE: 02/04/2024 Toe yoga great/lesser toe ext x 10 ea Standing gastroc stretch x 30 ea SLS x 30 ea Tandem stance x 30 ea FT EC x 30   PATIENT EDUCATION:  Education details: eval findings, PSFS, HEP, POC Person educated: Patient Education method: Explanation, Demonstration, and Handouts Education comprehension: verbalized understanding and returned demonstration  HOME EXERCISE PROGRAM: Access Code: [email or web address] URL: [email or web address] Date: 02/04/2024 Prepared by: Alm Kingdom  Exercises - Toe Yoga - Alternating Great Toe and Lesser Toe Extension  - 1 x daily - 7 x weekly - 2 sets - 10 reps - Gastroc Stretch on Wall  - 1 x daily - 7 x weekly - 3 reps - 30 sec hold - Seated Plantar Fascia Mobilization with Small Ball  - 1 x daily - 7 x weekly - 2 min hold - Single Leg Stance  - 1  x daily - 7 x weekly - 3 sets - 10 reps - Tandem Stance  - 1 x daily - 7 x weekly - 2-3 sets - 30 sec hold - Romberg Stance with Eyes Closed  - 1 x daily - 7 x weekly - 2-3 sets - 30 sec hold - Seated Hamstring Stretch  - 1 x daily - 7 x weekly - 1 sets - 3 reps - 30 hold - Tandem Stance with Head Rotation  - 1 x daily - 7 x weekly - 1 sets - 3 reps  ASSESSMENT:  CLINICAL IMPRESSION: ***  EVAL: Patient is a 73 y.o. M who was seen today for physical therapy evaluation and treatment for bilateral foot pain and sensory balance deficits. Physical findings are consistent with subjective and referring provider impression as pt has decrease in calf length most likely secondary to chronic plantar fascitis and appears to be very visually reliant for balance. He would benefit from skilled PT services working on improving foot intrinsics and calf length as well as improving sensory balance with particular emphasis on vestibular training.   OBJECTIVE IMPAIRMENTS: decreased activity tolerance, decreased balance, decreased mobility, and decreased ROM  ACTIVITY LIMITATIONS: standing, squatting, and hygiene/grooming  PARTICIPATION LIMITATIONS: shopping, community activity, and yard work  PERSONAL FACTORS: Time  since onset of injury/illness/exacerbation and 3+ comorbidities: DM II, Peripheral neuropathy, MI are also affecting patient's functional outcome.   REHAB POTENTIAL: Good  CLINICAL DECISION MAKING: Evolving/moderate complexity  EVALUATION COMPLEXITY: Moderate   GOALS: Goals reviewed with patient? No  SHORT TERM GOALS: Target date: 02/25/2024   Pt Claud be compliant and knowledgeable with initial HEP for improved comfort and carryover Baseline: initial HEP given  Goal status: MET  2.  Pt Elic self report bilateral heel pain no greater than 0/10 for improved comfort and functional ability Baseline: 3/10 at worst 02/26/24: discomfort when barefoot  Goal status: ONGOING   LONG TERM GOALS:  Target date: 03/31/2024   Pt Dirck improve PSFS by 2 points to 9 for improved functional ability and balance with desired activities  Baseline: 7 Goal status: INITIAL  2.  Pt Yancey improve condition 4 on mCTSIB to at least 30 seconds on average of 3 trials for improved sensory balance and overall stability Baseline: 14 sec Goal status: INITIAL  3.  Pt Ayven improve bilateral calf length to at least 10 degrees for improved mobility and decreased pain  Baseline: see chart Goal status: INITIAL  4.  Pt Juelz be compliant and knowledgeable with advanced HEP for improved comfort and carryover post discharge from therapy Baseline: N/A  Goal status: INITIAL   PLAN:  PT FREQUENCY: 1x/week  PT DURATION: 8 weeks  PLANNED INTERVENTIONS: 97164- PT Re-evaluation, 97110-Therapeutic exercises, 97530- Therapeutic activity, [id number]- Neuromuscular re-education, 97535- Self Care, [id number]- Manual therapy, [id number]- Gait training, [phone]- Electrical stimulation (unattended), [id number]- Electrical stimulation (manual), 20560 (1-2 muscles), 20561 (3+ muscles)- Dry Needling, Cryotherapy, and Moist heat  PLAN FOR NEXT SESSION: assess HEP response, calf stretching, sensory balance training    Alm JAYSON Kingdom PT  03/04/24 7:58 AM

## 2024-03-04 NOTE — Telephone Encounter (Signed)
 PT called and spoke with patient regarding missed visit today. He got his days mixed up, confirmed next appointment.   Steven Mckee   03/04/24 10:05 AM

## 2024-03-11 ENCOUNTER — Ambulatory Visit: Attending: Podiatry

## 2024-03-11 DIAGNOSIS — M79672 Pain in left foot: Secondary | ICD-10-CM | POA: Diagnosis present

## 2024-03-11 DIAGNOSIS — R2681 Unsteadiness on feet: Secondary | ICD-10-CM | POA: Diagnosis present

## 2024-03-11 DIAGNOSIS — M79671 Pain in right foot: Secondary | ICD-10-CM | POA: Diagnosis present

## 2024-03-11 NOTE — Therapy (Signed)
 OUTPATIENT PHYSICAL THERAPY TREATMENT   Patient Name: Steven Mckee MRN: [id number] DOB:[birth year], 73 y.o., male Today's Date: 03/11/2024  END OF SESSION:  PT End of Session - 03/11/24 1050     Visit Number 4    Number of Visits 9    Date for Recertification  03/31/24    Authorization Type [employer] Medicare    PT Start Time 1055    PT Stop Time 1135    PT Time Calculation (min) 40 min             Past Medical History:  Diagnosis Date   Angio-edema    CAD (coronary artery disease)    Diabetes mellitus without complication (HCC)    type 2   HTN (hypertension) 10/28/2012   Hypercholesteremia    Low testosterone    Metabolic syndrome    Mitral valve regurgitation    Myocardial infarction (HCC)    Obesity    OSA on CPAP    Past Surgical History:  Procedure Laterality Date   COLONOSCOPY     SKIN SURGERY     Mohs   VENTRAL HERNIA REPAIR N/A 01/11/2020   Procedure: OPEN REPAIR VENTRAL HERNIA WITH MESH PATCH;  Surgeon: Eletha Boas, MD;  Location: [facility];  Service: General;  Laterality: N/A;  LMA   Patient Active Problem List   Diagnosis Date Noted   Basal cell carcinoma (BCC) of left thigh 12/31/2023   History of malignant neoplasm of skin 12/31/2023   History of neoplasm 12/31/2023   Lentigo 12/31/2023   Melanocytic nevus of trunk 12/31/2023   Plaque psoriasis 12/31/2023   SK (seborrheic keratosis) 12/31/2023   Vasomotor rhinitis 03/13/2022   Nocturia 03/09/2021   Other allergic rhinitis 10/20/2020   Umbilical hernia 01/10/2020   Ventral hernia 01/10/2020   Rectus diastasis 01/10/2020   Mitral valvular regurgitation 03/02/2014   Hyperlipidemia 03/02/2014   Coronary artery disease involving native coronary artery of native heart with angina pectoris    Metabolic syndrome    HTN (hypertension) 10/28/2012   Obesity, unspecified 10/28/2012    PCP: Dayna Motto, DO   REFERRING PROVIDER: Verta Royden DASEN, DPM  REFERRING DIAG:  R26.81 (ICD-10-CM) - Unsteady  gait R26.89 (ICD-10-CM) - Balance problem  THERAPY DIAG:  Unsteadiness on feet  Pain in left foot  Pain in right foot  Rationale for Evaluation and Treatment: Rehabilitation  ONSET DATE: Chronic  SUBJECTIVE:   SUBJECTIVE STATEMENT: Pt presents to PT with no current pain. Had a cramping sensation in foot last night but it went away quickly. Has been compliant with HEP.   EVAL: Pt presents to PT with reports of chronic plantar fascitis symptoms bilaterally but notes that they have been slowly getting better. Most pain is with first step in the morning. His main compliant or worry is that he has lost his balance a few times over the last few months, with the biggest instance being when he closed his eyes while showering. Has not fallen but wants to make sure his balance is good for the future as well. Has hx of peripheral neuropathy but the severity has decreased slightly. He feels his DM II is better managed now.   PERTINENT HISTORY: DM II, Peripheral neuropathy, MI  PAIN:  Are you having pain?  Yes: NPRS scale: 0/10 Worst: 2/10 Pain location: bilateral plantar fascia Pain description: tight, sore Aggravating factors: first step in morning Relieving factors: walking  PRECAUTIONS: None  RED FLAGS: None   WEIGHT BEARING RESTRICTIONS: No  FALLS:  Has patient fallen in last 6 months? No  LIVING ENVIRONMENT: Lives with: lives with their family Lives in: House/apartment  OCCUPATION: Retired  PLOF: Independent  PATIENT GOALS: improve balance, prevent falls and decrease foot pain  NEXT MD VISIT: PRN  OBJECTIVE:  Note: Objective measures were completed at Evaluation unless otherwise noted.  DIAGNOSTIC FINDINGS: N/A  PATIENT SURVEYS:  PSFS: THE PATIENT SPECIFIC FUNCTIONAL SCALE  Place score of 0-10 (0 = unable to perform activity and 10 = able to perform activity at the same level as before injury or problem)  Activity Date: 02/04/2024    Walking without shoes 5     2.closing eyes in shower 9    3.     4.      Total Score 7      Total Score = Sum of activity scores/number of activities  Minimally Detectable Change: 3 points (for single activity); 2 points (for average score)  [name] (nd). The Patient Specific Functional Scale . Retrieved from [email or web address]   COGNITION: Overall cognitive status: Within functional limits for tasks assessed     SENSATION: Light touch: Impaired - bilateral feet  POSTURE: No Significant postural limitations  PALPATION: Slight TTP to bilateral heel  LOWER EXTREMITY ROM:  Active ROM Right eval Left eval  Hip flexion    Hip extension    Hip abduction    Hip adduction    Hip internal rotation    Hip external rotation    Knee flexion    Knee extension    Ankle dorsiflexion 5 9  Ankle plantarflexion    Ankle inversion    Ankle eversion     (Blank rows = not tested)  LOWER EXTREMITY MMT:  MMT Right eval Left eval  Hip flexion    Hip extension    Hip abduction    Hip adduction    Hip internal rotation    Hip external rotation    Knee flexion    Knee extension    Ankle dorsiflexion 5 5  Ankle plantarflexion 5 5  Ankle inversion 5 5  Ankle eversion 5 5   (Blank rows = not tested)   FUNCTIONAL TESTS:  MCTSIB: Condition 1: Avg of 3 trials: 30 sec, Condition 2: Avg of 3 trials: 30 sec, Condition 3: Avg of 3 trials: 30 sec, Condition 4: Avg of 3 trials: 14 sec, and Total Score: 104/120  GAIT: Distance walked: 49ft Assistive device utilized: None Level of assistance: Complete Independence Comments: slight decrease in heel strike   TREATMENT: [facility] Adult PT Treatment:                                                DATE: 03/11/24 Therapeutic Exercise: Heel raise 2x20 reps  Slant board gastroc stretch 2x30 Slant board soleus stretch 2x30 Bilateral ankle DF/inv/ev 2x10 RTB Neuromuscular re-ed: Tandem on foam  2x30  FT EC on foam 2x30 Wobble board 2x45 BAPS L3 2x10 cw/ccw bilaterally  Eccentric heel lowering 2x10 2in  [facility] Adult PT Treatment:                                                DATE: 02/26/24 Therapeutic Exercise: Heel raise 2 x 20 reps  Slant board gastroc and soleus stretches x 2 each  Seated hamstring stretch  x 2 each  Long sitting Plantar Fascia stretch x 60 sec each  Towel scrunch 2x45  Toe yoga great/lesser toe ext x 10 ea Heel raise with ball 2x10 Updated HEP Neuromuscular re-ed: Tandem stance with head turns , trials each foot back SLS Right 20 sec, Left <10 Blue rocker board A/P and lateral weight shifts with EC Narrow stance on Airex Pad EC Heel raise on AIREX x 10 Gait with head turns and nods - no deviations  [facility] Adult PT Treatment:                                                DATE: 02/11/2024 NuStep lvl 6 LE only x 4 min for functional activity tolerance Slant board gastroc stretch 2x30 Slant board soleus stretch 2x30  Tandem stance on foam 2x30 ea Toe raises 2x20 Heel and toe walk on foam beam x 2 laps ea at [employer] fwd/bwd 2x60 FT EC on foam 2x30 Towel scrunch 2x45  Toe yoga great/lesser toe ext x 10 ea Heel raise with ball 2x10  [facility] Adult PT Treatment:                                                DATE: 02/04/2024 Toe yoga great/lesser toe ext x 10 ea Standing gastroc stretch x 30 ea SLS x 30 ea Tandem stance x 30 ea FT EC x 30   PATIENT EDUCATION:  Education details: eval findings, PSFS, HEP, POC Person educated: Patient Education method: Explanation, Demonstration, and Handouts Education comprehension: verbalized understanding and returned demonstration  HOME EXERCISE PROGRAM: Access Code: [email or web address] URL: [email or web address] Date: 02/04/2024 Prepared by: Alm Kingdom  Exercises - Toe Yoga - Alternating Great Toe and Lesser Toe Extension  - 1 x daily - 7 x weekly - 2 sets - 10 reps - Gastroc  Stretch on Wall  - 1 x daily - 7 x weekly - 3 reps - 30 sec hold - Seated Plantar Fascia Mobilization with Small Ball  - 1 x daily - 7 x weekly - 2 min hold - Single Leg Stance  - 1 x daily - 7 x weekly - 3 sets - 10 reps - Tandem Stance  - 1 x daily - 7 x weekly - 2-3 sets - 30 sec hold - Romberg Stance with Eyes Closed  - 1 x daily - 7 x weekly - 2-3 sets - 30 sec hold - Seated Hamstring Stretch  - 1 x daily - 7 x weekly - 1 sets - 3 reps - 30 hold - Tandem Stance with Head Rotation  - 1 x daily - 7 x weekly - 1 sets - 3 reps  ASSESSMENT:  CLINICAL IMPRESSION: Pt tolerated treatment well with no adverse effect. We continued to focus on improving calf muscle length and overall balance. Pt is progressing with therapy, continues to demo sensory balance deficits and distal LE tightness. Shoua continue per POC.   EVAL: Patient is a 73 y.o. M who was seen today for physical therapy evaluation and treatment for bilateral foot pain and sensory balance deficits. Physical findings are consistent with subjective and referring provider impression  as pt has decrease in calf length most likely secondary to chronic plantar fascitis and appears to be very visually reliant for balance. He would benefit from skilled PT services working on improving foot intrinsics and calf length as well as improving sensory balance with particular emphasis on vestibular training.   OBJECTIVE IMPAIRMENTS: decreased activity tolerance, decreased balance, decreased mobility, and decreased ROM  ACTIVITY LIMITATIONS: standing, squatting, and hygiene/grooming  PARTICIPATION LIMITATIONS: shopping, community activity, and yard work  PERSONAL FACTORS: Time since onset of injury/illness/exacerbation and 3+ comorbidities: DM II, Peripheral neuropathy, MI are also affecting patient's functional outcome.   REHAB POTENTIAL: Good  CLINICAL DECISION MAKING: Evolving/moderate complexity  EVALUATION COMPLEXITY: Moderate   GOALS: Goals  reviewed with patient? No  SHORT TERM GOALS: Target date: 02/25/2024   Pt Fue be compliant and knowledgeable with initial HEP for improved comfort and carryover Baseline: initial HEP given  Goal status: MET  2.  Pt Deandrea self report bilateral heel pain no greater than 0/10 for improved comfort and functional ability Baseline: 3/10 at worst 02/26/24: discomfort when barefoot  Goal status: ONGOING   LONG TERM GOALS: Target date: 03/31/2024   Pt Teejay improve PSFS by 2 points to 9 for improved functional ability and balance with desired activities  Baseline: 7 Goal status: INITIAL  2.  Pt Ryker improve condition 4 on mCTSIB to at least 30 seconds on average of 3 trials for improved sensory balance and overall stability Baseline: 14 sec Goal status: INITIAL  3.  Pt Sekou improve bilateral calf length to at least 10 degrees for improved mobility and decreased pain  Baseline: see chart Goal status: INITIAL  4.  Pt Melquan be compliant and knowledgeable with advanced HEP for improved comfort and carryover post discharge from therapy Baseline: N/A  Goal status: INITIAL   PLAN:  PT FREQUENCY: 1x/week  PT DURATION: 8 weeks  PLANNED INTERVENTIONS: 97164- PT Re-evaluation, 97110-Therapeutic exercises, 97530- Therapeutic activity, [id number]- Neuromuscular re-education, 97535- Self Care, 02859- Manual therapy, [id number]- Gait training, [phone]- Electrical stimulation (unattended), [id number]- Electrical stimulation (manual), 20560 (1-2 muscles), 20561 (3+ muscles)- Dry Needling, Cryotherapy, and Moist heat  PLAN FOR NEXT SESSION: assess HEP response, calf stretching, sensory balance training    Alm JAYSON Kingdom PT  03/11/24 1:02 PM

## 2024-03-23 ENCOUNTER — Ambulatory Visit

## 2024-03-23 DIAGNOSIS — R2681 Unsteadiness on feet: Secondary | ICD-10-CM

## 2024-03-23 DIAGNOSIS — M79671 Pain in right foot: Secondary | ICD-10-CM

## 2024-03-23 DIAGNOSIS — M79672 Pain in left foot: Secondary | ICD-10-CM

## 2024-03-23 NOTE — Therapy (Signed)
 OUTPATIENT PHYSICAL THERAPY TREATMENT   Patient Name: Rodert Hinch MRN: [id number] DOB:1950-10-21, 73 y.o., male Today's Date: 03/23/2024  END OF SESSION:  PT End of Session - 03/23/24 1403     Visit Number 5    Number of Visits 9    Date for Recertification  03/31/24    Authorization Type [employer]    PT Start Time 1400    PT Stop Time 1440    PT Time Calculation (min) 40 min              Past Medical History:  Diagnosis Date   Angio-edema    CAD (coronary artery disease)    Diabetes mellitus without complication (HCC)    type 2   HTN (hypertension) 10/28/2012   Hypercholesteremia    Low testosterone    Metabolic syndrome    Mitral valve regurgitation    Myocardial infarction (HCC)    Obesity    OSA on CPAP    Past Surgical History:  Procedure Laterality Date   COLONOSCOPY     SKIN SURGERY     Mohs   VENTRAL HERNIA REPAIR N/A 01/11/2020   Procedure: OPEN REPAIR VENTRAL HERNIA WITH MESH PATCH;  Surgeon: Eletha Boas, MD;  Location: [facility];  Service: General;  Laterality: N/A;  LMA   Patient Active Problem List   Diagnosis Date Noted   Basal cell carcinoma (BCC) of left thigh 12/31/2023   History of malignant neoplasm of skin 12/31/2023   History of neoplasm 12/31/2023   Lentigo 12/31/2023   Melanocytic nevus of trunk 12/31/2023   Plaque psoriasis 12/31/2023   SK (seborrheic keratosis) 12/31/2023   Vasomotor rhinitis 03/13/2022   Nocturia 03/09/2021   Other allergic rhinitis 10/20/2020   Umbilical hernia 01/10/2020   Ventral hernia 01/10/2020   Rectus diastasis 01/10/2020   Mitral valvular regurgitation 03/02/2014   Hyperlipidemia 03/02/2014   Coronary artery disease involving native coronary artery of native heart with angina pectoris    Metabolic syndrome    HTN (hypertension) 10/28/2012   Obesity, unspecified 10/28/2012    PCP: Dayna Motto, DO   REFERRING PROVIDER: Verta Royden DASEN, DPM  REFERRING DIAG:  R26.81 (ICD-10-CM) - Unsteady  gait R26.89 (ICD-10-CM) - Balance problem  THERAPY DIAG:  Unsteadiness on feet  Pain in left foot  Pain in right foot  Rationale for Evaluation and Treatment: Rehabilitation  ONSET DATE: Chronic  SUBJECTIVE:   SUBJECTIVE STATEMENT: Pt presents to PT with bilateral foot pain. Has been compliant with HEP.   EVAL: Pt presents to PT with reports of chronic plantar fascitis symptoms bilaterally but notes that they have been slowly getting better. Most pain is with first step in the morning. His main compliant or worry is that he has lost his balance a few times over the last few months, with the biggest instance being when he closed his eyes while showering. Has not fallen but wants to make sure his balance is good for the future as well. Has hx of peripheral neuropathy but the severity has decreased slightly. He feels his DM II is better managed now.   PERTINENT HISTORY: DM II, Peripheral neuropathy, MI  PAIN:  Are you having pain?  Yes: NPRS scale: 0/10 Worst: 2/10 Pain location: bilateral plantar fascia Pain description: tight, sore Aggravating factors: first step in morning Relieving factors: walking  PRECAUTIONS: None  RED FLAGS: None   WEIGHT BEARING RESTRICTIONS: No  FALLS:  Has patient fallen in last 6 months? No  LIVING ENVIRONMENT: Lives  with: lives with their family Lives in: House/apartment  OCCUPATION: Retired  PLOF: Independent  PATIENT GOALS: improve balance, prevent falls and decrease foot pain  NEXT MD VISIT: PRN  OBJECTIVE:  Note: Objective measures were completed at Evaluation unless otherwise noted.  DIAGNOSTIC FINDINGS: N/A  PATIENT SURVEYS:  PSFS: THE PATIENT SPECIFIC FUNCTIONAL SCALE  Place score of 0-10 (0 = unable to perform activity and 10 = able to perform activity at the same level as before injury or problem)  Activity Date: 02/04/2024    Walking without shoes 5    2.closing eyes in shower 9    3.     4.      Total Score 7       Total Score = Sum of activity scores/number of activities  Minimally Detectable Change: 3 points (for single activity); 2 points (for average score)  [name] (nd). The Patient Specific Functional Scale . Retrieved from [email or web address]   COGNITION: Overall cognitive status: Within functional limits for tasks assessed     SENSATION: Light touch: Impaired - bilateral feet  POSTURE: No Significant postural limitations  PALPATION: Slight TTP to bilateral heel  LOWER EXTREMITY ROM:  Active ROM Right eval Left eval  Hip flexion    Hip extension    Hip abduction    Hip adduction    Hip internal rotation    Hip external rotation    Knee flexion    Knee extension    Ankle dorsiflexion 5 9  Ankle plantarflexion    Ankle inversion    Ankle eversion     (Blank rows = not tested)  LOWER EXTREMITY MMT:  MMT Right eval Left eval  Hip flexion    Hip extension    Hip abduction    Hip adduction    Hip internal rotation    Hip external rotation    Knee flexion    Knee extension    Ankle dorsiflexion 5 5  Ankle plantarflexion 5 5  Ankle inversion 5 5  Ankle eversion 5 5   (Blank rows = not tested)   FUNCTIONAL TESTS:  MCTSIB: Condition 1: Avg of 3 trials: 30 sec, Condition 2: Avg of 3 trials: 30 sec, Condition 3: Avg of 3 trials: 30 sec, Condition 4: Avg of 3 trials: 14 sec, and Total Score: 104/120  GAIT: Distance walked: 63ft Assistive device utilized: None Level of assistance: Complete Independence Comments: slight decrease in heel strike   TREATMENT: [facility] Adult PT Treatment:                                                DATE: 03/23/24 Therapeutic Exercise: Heel raise 2x20 reps  Slant board gastroc/soleus stretch x 45 ea Bilateral ankle inv 2x15 GTB 1st MTP mob with firm ball x 20 each Neuromuscular re-ed: Tandem on foam 2x30  SLS on foam 2x30 ea Tandem walk on foam beam x 3  laps FT EC on foam 2x30 Tandem on foam with head turns 2x30 Wobble board 2x45 Eccentric heel lowering 2x10 4in  PATIENT EDUCATION:  Education details: eval findings, PSFS, HEP, POC Person educated: Patient Education method: Explanation, Demonstration, and Handouts Education comprehension: verbalized understanding and returned demonstration  HOME EXERCISE PROGRAM: Access Code: [email or web address] URL: [email or web address] Date: 02/04/2024 Prepared by: Alm Kingdom  Exercises - Toe Yoga - Alternating Great Toe and Lesser Toe  Extension  - 1 x daily - 7 x weekly - 2 sets - 10 reps - Gastroc Stretch on Wall  - 1 x daily - 7 x weekly - 3 reps - 30 sec hold - Seated Plantar Fascia Mobilization with Small Ball  - 1 x daily - 7 x weekly - 2 min hold - Single Leg Stance  - 1 x daily - 7 x weekly - 3 sets - 10 reps - Tandem Stance  - 1 x daily - 7 x weekly - 2-3 sets - 30 sec hold - Romberg Stance with Eyes Closed  - 1 x daily - 7 x weekly - 2-3 sets - 30 sec hold - Seated Hamstring Stretch  - 1 x daily - 7 x weekly - 1 sets - 3 reps - 30 hold - Tandem Stance with Head Rotation  - 1 x daily - 7 x weekly - 1 sets - 3 reps  ASSESSMENT:  CLINICAL IMPRESSION: Pt tolerated treatment well with no adverse effect. We continued to focus on improving calf muscle length and overall balance as well as foot intrinsic strength. Pt is progressing with therapy, continues to demo sensory balance deficits and distal LE tightness. Lonney continue per POC.   EVAL: Patient is a 73 y.o. M who was seen today for physical therapy evaluation and treatment for bilateral foot pain and sensory balance deficits. Physical findings are consistent with subjective and referring provider impression as pt has decrease in calf length most likely secondary to chronic plantar fascitis and appears to be very visually reliant for balance. He would benefit from skilled PT services working on improving foot intrinsics and calf  length as well as improving sensory balance with particular emphasis on vestibular training.   OBJECTIVE IMPAIRMENTS: decreased activity tolerance, decreased balance, decreased mobility, and decreased ROM  ACTIVITY LIMITATIONS: standing, squatting, and hygiene/grooming  PARTICIPATION LIMITATIONS: shopping, community activity, and yard work  PERSONAL FACTORS: Time since onset of injury/illness/exacerbation and 3+ comorbidities: DM II, Peripheral neuropathy, MI are also affecting patient's functional outcome.   REHAB POTENTIAL: Good  CLINICAL DECISION MAKING: Evolving/moderate complexity  EVALUATION COMPLEXITY: Moderate   GOALS: Goals reviewed with patient? No  SHORT TERM GOALS: Target date: 02/25/2024   Pt Larnce be compliant and knowledgeable with initial HEP for improved comfort and carryover Baseline: initial HEP given  Goal status: MET  2.  Pt Moo self report bilateral heel pain no greater than 0/10 for improved comfort and functional ability Baseline: 3/10 at worst 02/26/24: discomfort when barefoot  Goal status: ONGOING   LONG TERM GOALS: Target date: 03/31/2024   Pt Mack improve PSFS by 2 points to 9 for improved functional ability and balance with desired activities  Baseline: 7 Goal status: INITIAL  2.  Pt Tulio improve condition 4 on mCTSIB to at least 30 seconds on average of 3 trials for improved sensory balance and overall stability Baseline: 14 sec Goal status: INITIAL  3.  Pt Georgios improve bilateral calf length to at least 10 degrees for improved mobility and decreased pain  Baseline: see chart Goal status: INITIAL  4.  Pt Jimmie be compliant and knowledgeable with advanced HEP for improved comfort and carryover post discharge from therapy Baseline: N/A  Goal status: INITIAL   PLAN:  PT FREQUENCY: 1x/week  PT DURATION: 8 weeks  PLANNED INTERVENTIONS: [id number]- PT Re-evaluation, 97110-Therapeutic exercises, 97530- Therapeutic activity, [id number]- Neuromuscular  re-education, 97535- Self Care, [id number]- Manual therapy, [id number]- Gait training, [phone]- Electrical stimulation (unattended), [phone]- Electrical  stimulation (manual), 79439 (1-2 muscles), [id number] (3+ muscles)- Dry Needling, Cryotherapy, and Moist heat  PLAN FOR NEXT SESSION: assess HEP response, calf stretching, sensory balance training    Alm JAYSON Kingdom PT  03/23/24 4:00 PM

## 2024-03-27 ENCOUNTER — Ambulatory Visit: Admitting: Internal Medicine

## 2024-03-27 ENCOUNTER — Encounter: Payer: Self-pay | Admitting: Internal Medicine

## 2024-03-27 VITALS — BP 112/64 | HR 62 | Temp 97.8°F | Ht 70.0 in | Wt 244.8 lb

## 2024-03-27 DIAGNOSIS — Z87891 Personal history of nicotine dependence: Secondary | ICD-10-CM

## 2024-03-27 DIAGNOSIS — Z122 Encounter for screening for malignant neoplasm of respiratory organs: Secondary | ICD-10-CM

## 2024-03-27 DIAGNOSIS — Z23 Encounter for immunization: Secondary | ICD-10-CM

## 2024-03-27 DIAGNOSIS — J849 Interstitial pulmonary disease, unspecified: Secondary | ICD-10-CM

## 2024-03-27 NOTE — Progress Notes (Signed)
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 concerns    Former smoker.  HPI Steven Mckee 73 y.o. -returns for follow-up.  Today is his 16th year wedding anniversary.  Presents with his wife.  He says from a respiratory standpoint he is feeling stable.  No change in shortness of breath.  He did not see the surgeon because apparently the  referral from my office did not go through.  He says he has been following calorie control and is lost 15 pounds of weight.  Wife is also lost 9 pounds.  They are happy about it.  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  He and his wife also discussed his vaccination status.  He is in need of RSV vaccine and flu vaccine.  Tyrian give him the high-dose flu shot today.  I counseled him extensively about RSV and he Dj have it  commercially.  He was able to get list of his other vaccines from his primary care.  I have asked CMA to populate this into our medical record.  In terms of progression of ILD he had pulmonary function test and it is stable.  He had high-resolution CT chest unfortunately the results are not back yet because of radiologist shortage.  Did indicate to him it Maston Mckee a few weeks but my personal visualization showing it to him it looks stable.  Wife also feels that he is doing stable.   OV 03/27/2024  Subjective:  Patient ID: Steven Mckee, male , DOB: 1950-10-28 , age 58 y.o. , MRN: [id number] , ADDRESS: [address] [place] [id number] PCP Dayna Motto, DO Patient Care Team: Dayna Motto, DO as PCP - General (Family Medicine) Shlomo Wilbert SAUNDERS, MD as PCP - Sleep Medicine (Cardiology) Jeffrie Oneil BROCKS, MD as PCP - Cardiology (Cardiology)  This Provider for this visit: Treatment Team:  Attending Provider: Geronimo Amel, MD    03/27/2024 -   Chief Complaint  Patient presents with   Interstitial Lung Disease    Pt states since LOV breathing has been perfect, most night around 3:00am sinus close up, making it hard to breathe     Obesity BMI 38-39.  Fomer smoker - 40 ppd. Quit 2010 #Lung cancer screening   Follow-up interstitial lung disease: W - Subclinical with normal PFT and asymptomatic state - Phenotype - at risk for UIP/IPF -MDD Feb 2024: Chronic HP [has a down comforter]      The consensus in a conference discussion February 2024 is that you have mild interstitial lung diseas. Differential diagnosis includes hypersensitive pneumonitis [from your down comforter exposure], fibrotic NSIP or idiopathic pulmonary fibrosis.  All of these varieties can progress all the different rates.  The former to tend to progress very slowly while the latter is more aggressive.  - confierence consensus was that bioopsy is recommended -  - Met Dr. Octavio in January 2025 and  declined biopsy. HPI Waymond Cuffe 73 y.o. -present for ILD follow-up.  Presents with his wife who is an independent historian.  He reports basically in the last 1 year other than dealing with a scalp issue he is overall stable.  His antihypertensive has been stopped for the last couple of months and since then he is having more energy.  Otherwise no hospitalizations no emergency room visits no urgent care visits no surgeries.  Respiratory status is stable.  He did not have pulmonary function test today.  But subjectively he is stable.  Indicated to him that 1 year for imaging given his smoking history is required in December 2025.  He is open to getting this.  At this point in time because he stable we took a shared decision making for continued close follow-up.     SYMPTOM SCALE - ILD 12/26/2021 10/20/20230 265#0  07/02/2022  10/15/2022  03/27/2024    Current weight  No re0al changes since July.     O2 use ra ra ra ra 0  Shortness of Breath 0 -> 5 scale with 5 being worst (score 6 If unable to do)    0  At rest 0 0 0 0 0  Simple tasks - showers, clothes change, eating, shaving 0 0 0 0 0  Household (dishes, doing bed, laundry) 0 0  0 0  Shopping 0 0 0 0 0  Walking level at own pace 0 0 0 0 2  Walking up Stairs 1 0 -but according to the wife when he climbs 50 steps he gets winded 0 0 2  Total (30-36) Dyspnea Score 0 0 0  0  How bad is your cough? 00 0 0 0 0  How bad is your fatigue 0 0  0 0  How bad is nausea 0 0 0 0 0  How bad is vomiting?  0 0 0 0 0  How bad is diarrhea? 0 00 0 0 0  How bad is anxiety? 0 0   0  How bad is depression 0 00 0 0 0  Any chronic pain - if so where and how bad 0 0 0 0 2 - scalpe Rx        SIT STAND TEST - goal 15 times   03/27/2024    O2 used ra   PRobe - finter or forehead finger   Number sit and stand completed - goal 15 15   Time taken to complete 44 sec   Resting Pulse Ox/HR/Dyspnea  98% and 64/min and dyspnea of 0/10    Peak measures 99 %  and 80/min and dyspnea of 1/10   Final Pulse Ox/HR 97% and 70/min and dyspnea of 1/10   Desaturated </= 88% no   Desaturated <= 3% points no   Got Tachycardic >/= 90/min no   Miscellaneous comments x       PFT     Latest Ref Rng & Units 05/23/2023    4:18 PM 02/19/2023    9:30 AM 10/15/2022    1:08 PM 07/02/2022   10:27 AM 03/30/2022    1:56 PM 12/26/2021    8:58 AM  PFT Results  FVC-Pre L 3.21  2.91  3.20  3.21  3.21  3.24   FVC-Predicted Pre % 73  66  73  73  72  73   FVC-Post L      3.21   FVC-Predicted Post %      72   Pre FEV1/FVC % % 87  88  88  87  88  87   Post FEV1/FCV % %      90   FEV1-Pre L 2.79  2.57  2.81  2.78  2.82  2.83   FEV1-Predicted Pre % 86  79  87  86  86  86   FEV1-Post L      2.89   DLCO uncorrected ml/min/mmHg 20.32  19.02  22.52  21.09  21.63  21.52   DLCO UNC% % 78  73  87  81  83  83   DLCO corrected ml/min/mmHg 20.32  19.02  22.52  21.09  21.63  21.52   DLCO COR %Predicted % 78  73  87  81  83  83   DLVA Predicted % 106  105  117  106  112  108   TLC L  4.67   TLC % Predicted %      66   RV % Predicted %      54        LAB RESULTS last 96 hours No results found.       has a past medical history of Angio-edema, CAD (coronary artery disease), Diabetes mellitus without complication (HCC), Heart murmur, HTN (hypertension) (10/28/2012), Hypercholesteremia, Low testosterone, Metabolic syndrome, Mitral valve regurgitation, Myocardial infarction (HCC), Obesity, and OSA on CPAP.   reports that he quit smoking about 15 years ago. His smoking use included cigarettes. He started smoking about 55 years ago. He has a 40.7 pack-year smoking history. He has never used smokeless tobacco.  Past Surgical History:  Procedure Laterality Date   CARDIAC CATHETERIZATION     COLONOSCOPY     SKIN SURGERY     Mohs   VENTRAL HERNIA REPAIR N/A 01/11/2020   Procedure: OPEN REPAIR VENTRAL HERNIA WITH MESH PATCH;  Surgeon: Eletha Boas, MD;  Location: [facility];   Service: General;  Laterality: N/A;  LMA    No Known Allergies  Immunization History  Administered Date(s) Administered   Fluad Quad(high Dose 65+) 03/30/2022   INFLUENZA, HIGH DOSE SEASONAL PF 05/24/2021, 05/27/2023, 03/27/2024   Influenza-Unspecified 03/04/2013   PFIZER(Purple Top)SARS-COV-2 Vaccination 07/04/2019, 07/26/2019, 04/01/2020   PNEUMOCOCCAL CONJUGATE-20 05/24/2021   Pneumococcal Conjugate-13 05/24/2021   Tdap 06/29/2021    Family History  Problem Relation Age of Onset   Dementia Mother    Allergic rhinitis Mother    Heart disease Father        Died of a stroke during surgury   Stroke Father    Allergic rhinitis Sister    Asthma Neg Hx    Eczema Neg Hx    Urticaria Neg Hx      Current Outpatient Medications:    aspirin  EC 81 MG tablet, Take 1 tablet (81 mg total) by mouth daily., Disp: 90 tablet, Rfl: 3   metFORMIN (GLUCOPHAGE) 500 MG tablet, Take 500 mg by mouth daily with breakfast., Disp: , Rfl:    nitroGLYCERIN  (NITROSTAT ) 0.4 MG SL tablet, Place 1 tablet (0.4 mg total) under the tongue every 5 (five) minutes as needed for chest pain., Disp: 25 tablet, Rfl: 5   REPATHA  SURECLICK 140 MG/ML SOAJ, ADMINISTER 1 ML UNDER THE SKIN EVERY 14 DAYS, Disp: 2 mL, Rfl: 11   halobetasol (ULTRAVATE) 0.05 % ointment, Apply 1 application topically daily as needed. (contact dermatitis) (Patient not taking: Reported on 03/27/2024), Disp: , Rfl:    ibuprofen (ADVIL) 200 MG tablet, Take 200-400 mg by mouth daily as needed for headache or moderate pain. (Patient not taking: Reported on 03/27/2024), Disp: , Rfl:    lisinopril -hydrochlorothiazide  (ZESTORETIC ) 20-12.5 MG tablet, TAKE 1 TABLET BY MOUTH DAILY (Patient not taking: Reported on 03/27/2024), Disp: 90 tablet, Rfl: 0   sodium chloride  (OCEAN) 0.65 % SOLN nasal spray, Place 2 sprays into both nostrils in the morning and at bedtime. (Patient not taking: Reported on 03/27/2024), Disp: , Rfl: 0 No current facility-administered  medications for this visit.  Facility-Administered Medications Ordered in Other Visits:    regadenoson  (LEXISCAN ) injection SOLN 0.4 mg, 0.4 mg, Intravenous, Once, Nahser, Aleene PARAS, MD      Objective:   Vitals:   03/27/24 0944  BP: 112/64  Pulse: 62  Temp: 97.8 F (36.6 C)  TempSrc: Oral  SpO2: 98%  Weight: 244 lb 12.8 oz (111 kg)  Height: 5' 10 (1.778 m)    Estimated  body mass index is 35.13 kg/m as calculated from the following:   Height as of this encounter: 5' 10 (1.778 m).   Weight as of this encounter: 244 lb 12.8 oz (111 kg).  @WEIGHTCHANGE @  Filed Weights   03/27/24 0944  Weight: 244 lb 12.8 oz (111 kg)     Physical Exam   General: No distress. Looks well O2 at rest: no Cane present: no Sitting in wheel chair: no Frail: no Obese: YES Neuro: Alert and Oriented x 3. GCS 15. Speech normal Psych: Pleasant Resp:  Barrel Chest - no.  Wheeze - no, Crackles - no, No overt respiratory distress CVS: Normal heart sounds. Murmurs - no Ext: Stigmata of Connective Tissue Disease - no HEENT: Normal upper airway. PEERL +. No post nasal drip        Assessment/     Assessment & Plan ILD (interstitial lung disease) (HCC)  Stopped smoking with greater than 40 pack year history  Encounter for screening for lung cancer  Flu vaccine need    PLAN Patient Instructions  ILD (interstitial lung disease) (HCC) Former smoker Down comforter exposure   Currently stable.  Last seen December 2024.  Last CT was in end of 2024 and last pulmonary function test was end of 2024.  We took a shared decision making that you do not want to undergo a lung biopsy.   Plan - Supportive care with continued monitoring for progression is what I would recommend - Do repeat spirometry and DLCO in 6 months - Do high-resolution CT chest dec 2025    [facility] counseling  Plan  - high dose flu shot 03/27/2024  Lung cancer screening  - last CT Dec 2024   Plan  =- capture info  in HRCT Dec 2025; Darriel inform results  followup -8  months - 15 min visit; but after spiro/dlco   - walk and ILD symptoms score at followup    FOLLOWUP    Return for -8  months - 15 min visit; but after spiro/dlco .    SIGNATURE    Dr. Dorethia Cave, M.D., F.C.C.P,  Pulmonary and Critical Care Medicine Staff Physician, [facility] Director - Interstitial Lung Disease  Program  Pulmonary Fibrosis [employer] [facility] at [facility] [place], [place], [address]  Pager: [phone], If no answer or between  15:00h - 7:00h: call [phone] Telephone: [phone]  4:27 PM 03/27/2024   Moderate Complexity MDM OFFICE  2021 E/M guidelines, first released in 2021, with minor revisions added in 2023 and 2024 Must meet the requirements for 2 out of 3 dimensions to qualify.    Number and complexity of problems addressed Amount and/or complexity of data reviewed Risk of complications and/or morbidity  One or more chronic illness with mild exacerbation, OR progression, OR  side effects of treatment  Two or more stable chronic illnesses  One undiagnosed new problem with uncertain prognosis  One acute illness with systemic symptoms   One Acute complicated injury Must meet the requirements for 1 of 3 of the categories)  Category 1: Tests and documents, historian  Any combination of 3 of the following:  Assessment requiring an independent historian  Review of prior external note(s) from each unique source  Review of results of each unique test  Ordering of each unique test    Category 2: Interpretation of tests   Independent interpretation of a test performed by another physician/other qualified health care professional (  not separately reported)  Category 3: Discuss management/tests  Discussion of management or test interpretation with external physician/other qualified health care professional/appropriate source (not  separately reported) Moderate risk of morbidity from additional diagnostic testing or treatment Examples only:  Prescription drug management  Decision regarding minor surgery with identfied patient or procedure risk factors  Decision regarding elective major surgery without identified patient or procedure risk factors  Diagnosis or treatment significantly limited by social determinants of health             HIGh Complexity  OFFICE   2021 E/M guidelines, first released in 2021, with minor revisions added in 2023. Must meet the requirements for 2 out of 3 dimensions to qualify.    Number and complexity of problems addressed Amount and/or complexity of data reviewed Risk of complications and/or morbidity  Severe exacerbation of chronic illness  Acute or chronic illnesses that may pose a threat to life or bodily function, e.g., multiple trauma, acute MI, pulmonary embolus, severe respiratory distress, progressive rheumatoid arthritis, psychiatric illness with potential threat to self or others, peritonitis, acute renal failure, abrupt change in neurological status Must meet the requirements for 2 of 3 of the categories)  Category 1: Tests and documents, historian  Any combination of 3 of the following:  Assessment requiring an independent historian  Review of prior external note(s) from each unique source  Review of results of each unique test  Ordering of each unique test    Category 2: Interpretation of tests    Independent interpretation of a test performed by another physician/other qualified health care professional (not separately reported)  Category 3: Discuss management/tests  Discussion of management or test interpretation with external physician/other qualified health care professional/appropriate source (not separately reported)  HIGH risk of morbidity from additional diagnostic testing or treatment Examples only:  Drug therapy requiring intensive  monitoring for toxicity  Decision for elective major surgery with identified pateint or procedure risk factors  Decision regarding hospitalization or escalation of level of care  Decision for DNR or to de-escalate care   Parenteral controlled  substances            LEGEND - Independent interpretation involves the interpretation of a test for which there is a CPT code, and an interpretation or report is customary. When a review and interpretation of a test is performed and documented by the provider, but not separately reported (billed), then this would represent an independent interpretation. This report does not need to conform to the usual standards of a complete report of the test. This does not include interpretation of tests that do not have formal reports such as a complete blood count with differential and blood cultures. Examples would include reviewing a chest radiograph and documenting in the medical record an interpretation, but not separately reporting (billing) the interpretation of the chest radiograph.   An appropriate source includes professionals who are not health care professionals but may Mckee involved in the management of the patient, such as a Clinical research associate, upper officer, case manager or teacher, and does not include discussion with family or informal caregivers.    - SDOH: SDOH are the conditions in the environments where people are born, live, learn, work, play, worship, and age that affect a wide range of health, functioning, and quality-of-life outcomes and risks. (e.g., housing, food insecurity, transportation, etc.). SDOH-related Z codes ranging from Z55-Z65 are the ICD-10-CM diagnosis codes used to document SDOH data Z55 - Problems related to education and literacy Z56 -  Problems related to employment and unemployment Z57 - Occupational exposure to risk factors Z58 - Problems related to physical environment Z59 - Problems related to housing and  economic circumstances [phone] - Problems related to social environment [phone] - Problems related to upbringing [phone] - Other problems related to primary support group, including family circumstances Z68 - Problems related to certain psychosocial circumstances Z65 - Problems related to other psychosocial circumstances

## 2024-03-27 NOTE — Patient Instructions (Addendum)
 ILD (interstitial lung disease) [facility] Former smoker Down comforter exposure   Currently stable.  Last seen December 2024.  Last CT was in end of 2024 and last pulmonary function test was end of 2024.  We took a shared decision making that you do not want to undergo a lung biopsy.   Plan - Supportive care with continued monitoring for progression is what I would recommend - Do repeat spirometry and DLCO in 6 months - Do high-resolution CT chest dec 2025    [facility] counseling  Plan  - high dose flu shot 03/27/2024  Lung cancer screening  - last CT Dec 2024   Plan  =- capture info in HRCT Dec 2025; Elfego inform results  followup -8  months - 15 min visit; but after spiro/dlco   - walk and ILD symptoms score at followup

## 2024-03-30 ENCOUNTER — Ambulatory Visit

## 2024-03-30 DIAGNOSIS — R2681 Unsteadiness on feet: Secondary | ICD-10-CM | POA: Diagnosis not present

## 2024-03-30 DIAGNOSIS — M79672 Pain in left foot: Secondary | ICD-10-CM

## 2024-03-30 DIAGNOSIS — M79671 Pain in right foot: Secondary | ICD-10-CM

## 2024-03-30 NOTE — Therapy (Signed)
 OUTPATIENT PHYSICAL THERAPY TREATMENT/DISCHARGE  PHYSICAL THERAPY DISCHARGE SUMMARY  Visits from Start of Care: 6  Current functional level related to goals / functional outcomes: See goals and objective   Remaining deficits: See goals and objective   Education / Equipment: HEP   Patient agrees to discharge. Patient goals were met. Patient is being discharged due to meeting the stated rehab goals.   Patient Name: Steven Mckee MRN: [id number] DOB:[birth year], 73 y.o., male Today's Date: 03/30/2024  END OF SESSION:  PT End of Session - 03/30/24 1358     Visit Number 6    Number of Visits 9    Date for Recertification  03/31/24    Authorization Type [employer] Medicare    PT Start Time 1400    PT Stop Time 1430    PT Time Calculation (min) 30 min    Activity Tolerance Patient tolerated treatment well    Behavior During Therapy WFL for tasks assessed/performed               Past Medical History:  Diagnosis Date   Angio-edema    CAD (coronary artery disease)    Diabetes mellitus without complication (HCC)    type 2   Heart murmur    HTN (hypertension) 10/28/2012   Hypercholesteremia    Low testosterone    Metabolic syndrome    Mitral valve regurgitation    Myocardial infarction (HCC)    Obesity    OSA on CPAP    Past Surgical History:  Procedure Laterality Date   CARDIAC CATHETERIZATION     COLONOSCOPY     SKIN SURGERY     Mohs   VENTRAL HERNIA REPAIR N/A 01/11/2020   Procedure: OPEN REPAIR VENTRAL HERNIA WITH MESH PATCH;  Surgeon: Eletha Boas, MD;  Location: [facility];  Service: General;  Laterality: N/A;  LMA   Patient Active Problem List   Diagnosis Date Noted   Basal cell carcinoma (BCC) of left thigh 12/31/2023   History of malignant neoplasm of skin 12/31/2023   History of neoplasm 12/31/2023   Lentigo 12/31/2023   Melanocytic nevus of trunk 12/31/2023   Plaque psoriasis 12/31/2023   SK (seborrheic keratosis) 12/31/2023   Vasomotor rhinitis  03/13/2022   Nocturia 03/09/2021   Other allergic rhinitis 10/20/2020   Umbilical hernia 01/10/2020   Ventral hernia 01/10/2020   Rectus diastasis 01/10/2020   Mitral valvular regurgitation 03/02/2014   Hyperlipidemia 03/02/2014   Coronary artery disease involving native coronary artery of native heart with angina pectoris    Metabolic syndrome    HTN (hypertension) 10/28/2012   Obesity, unspecified 10/28/2012    PCP: Dayna Motto, DO   REFERRING PROVIDER: Verta Royden DASEN, DPM  REFERRING DIAG:  R26.81 (ICD-10-CM) - Unsteady gait R26.89 (ICD-10-CM) - Balance problem  THERAPY DIAG:  Unsteadiness on feet  Pain in left foot  Pain in right foot  Rationale for Evaluation and Treatment: Rehabilitation  ONSET DATE: Chronic  SUBJECTIVE:   SUBJECTIVE STATEMENT: Pt presents to PT with reports of soreness after working in yard yesterday. Overall feels good about current functional level, remains compliant with HEP.   EVAL: Pt presents to PT with reports of chronic plantar fascitis symptoms bilaterally but notes that they have been slowly getting better. Most pain is with first step in the morning. His main compliant or worry is that he has lost his balance a few times over the last few months, with the biggest instance being when he closed his eyes while showering. Has not fallen  but wants to make sure his balance is good for the future as well. Has hx of peripheral neuropathy but the severity has decreased slightly. He feels his DM II is better managed now.   PERTINENT HISTORY: DM II, Peripheral neuropathy, MI  PAIN:  Are you having pain?  Yes: NPRS scale: 0/10 Worst: 2/10 Pain location: bilateral plantar fascia Pain description: tight, sore Aggravating factors: first step in morning Relieving factors: walking  PRECAUTIONS: None  RED FLAGS: None   WEIGHT BEARING RESTRICTIONS: No  FALLS:  Has patient fallen in last 6 months? No  LIVING ENVIRONMENT: Lives with: lives  with their family Lives in: House/apartment  OCCUPATION: Retired  PLOF: Independent  PATIENT GOALS: improve balance, prevent falls and decrease foot pain  NEXT MD VISIT: PRN  OBJECTIVE:  Note: Objective measures were completed at Evaluation unless otherwise noted.  DIAGNOSTIC FINDINGS: N/A  PATIENT SURVEYS:  PSFS: THE PATIENT SPECIFIC FUNCTIONAL SCALE  Place score of 0-10 (0 = unable to perform activity and 10 = able to perform activity at the same level as before injury or problem)  Activity Date: 02/04/2024 Date: 03/30/2024   Walking without shoes 5 7   2.closing eyes in shower 9 10   3.     4.      Total Score 7 9     Total Score = Sum of activity scores/number of activities  Minimally Detectable Change: 3 points (for single activity); 2 points (for average score)  [name] (nd). The Patient Specific Functional Scale . Retrieved from [email or web address]   COGNITION: Overall cognitive status: Within functional limits for tasks assessed     SENSATION: Light touch: Impaired - bilateral feet  POSTURE: No Significant postural limitations  PALPATION: Slight TTP to bilateral heel  LOWER EXTREMITY ROM:  Active ROM Right eval Left eval Right 10/20 Left 10/20  Hip flexion      Hip extension      Hip abduction      Hip adduction      Hip internal rotation      Hip external rotation      Knee flexion      Knee extension      Ankle dorsiflexion 5 9 11 12   Ankle plantarflexion      Ankle inversion      Ankle eversion       (Blank rows = not tested)  LOWER EXTREMITY MMT:  MMT Right eval Left eval  Hip flexion    Hip extension    Hip abduction    Hip adduction    Hip internal rotation    Hip external rotation    Knee flexion    Knee extension    Ankle dorsiflexion 5 5  Ankle plantarflexion 5 5  Ankle inversion 5 5  Ankle eversion 5 5   (Blank rows = not tested)   FUNCTIONAL  TESTS:  MCTSIB: Condition 1: Avg of 3 trials: 30 sec, Condition 2: Avg of 3 trials: 30 sec, Condition 3: Avg of 3 trials: 30 sec, Condition 4: Avg of 3 trials: 14 sec, and Total Score: 104/120  03/30/2024: 30 sec  GAIT: Distance walked: 28ft Assistive device utilized: None Level of assistance: Complete Independence Comments: slight decrease in heel strike   TREATMENT: [facility] Adult PT Treatment:  DATE: 03/30/24 Assessment of tests/measures, goals, and outcomes for discharge Standing gastroc and soleus stretch 2x30 each Tandem with head turns x 30  FT EC x 30  Eccentric heel lowering x 10 - 6in   PATIENT EDUCATION:  Education details: eval findings, PSFS, HEP, POC Person educated: Patient Education method: Explanation, Demonstration, and Handouts Education comprehension: verbalized understanding and returned demonstration  HOME EXERCISE PROGRAM: Access Code: [email or web address] URL: [email or web address] Date: 03/30/2024 Prepared by: Alm Kingdom  Exercises - Toe Yoga - Alternating Great Toe and Lesser Toe Extension  - 1 x daily - 7 x weekly - 2 sets - 10 reps - Seated Plantar Fascia Mobilization with Small Ball  - 1 x daily - 7 x weekly - 2 min hold - Ankle Inversion with Resistance  - 1 x daily - 7 x weekly - 3 sets - 15 reps - green band hold - Soleus Stretch on Wall  - 1 x daily - 7 x weekly - 3 reps - 30 sec hold - Gastroc Stretch on Wall  - 1 x daily - 7 x weekly - 3 reps - 30 sec hold - Tandem Stance with Head Rotation  - 1 x daily - 7 x weekly - 1 sets - 2 reps - 30 sec hold - Romberg Stance Eyes Closed on Foam Pad  - 1 x daily - 7 x weekly - 3 sets - 10 reps - Eccentric Heel Lowering on Step  - 1 x daily - 7 x weekly - 2-3 sets - 10 reps - Seated Hamstring Stretch  - 1 x daily - 7 x weekly - 1 sets - 3 reps - 30 sec hold  ASSESSMENT:  CLINICAL IMPRESSION: Pt was able to complete all prescribed exercises with no  adverse effect and demonstrated knowledge of HEP. Over the course of PT he progressed well meeting all LTGs and should continue to improve with HEP compliance.   EVAL: Patient is a 73 y.o. M who was seen today for physical therapy evaluation and treatment for bilateral foot pain and sensory balance deficits. Physical findings are consistent with subjective and referring provider impression as pt has decrease in calf length most likely secondary to chronic plantar fascitis and appears to be very visually reliant for balance. He would benefit from skilled PT services working on improving foot intrinsics and calf length as well as improving sensory balance with particular emphasis on vestibular training.   OBJECTIVE IMPAIRMENTS: decreased activity tolerance, decreased balance, decreased mobility, and decreased ROM  ACTIVITY LIMITATIONS: standing, squatting, and hygiene/grooming  PARTICIPATION LIMITATIONS: shopping, community activity, and yard work  PERSONAL FACTORS: Time since onset of injury/illness/exacerbation and 3+ comorbidities: DM II, Peripheral neuropathy, MI are also affecting patient's functional outcome.   REHAB POTENTIAL: Good  CLINICAL DECISION MAKING: Evolving/moderate complexity  EVALUATION COMPLEXITY: Moderate   GOALS: Goals reviewed with patient? No  SHORT TERM GOALS: Target date: 02/25/2024   Pt Abe be compliant and knowledgeable with initial HEP for improved comfort and carryover Baseline: initial HEP given  Goal status: MET  2.  Pt Amareon self report bilateral heel pain no greater than 0/10 for improved comfort and functional ability Baseline: 3/10 at worst 02/26/24: discomfort when barefoot  Goal status: MOSTLY MET  LONG TERM GOALS: Target date: 03/31/2024   Pt Drayden improve PSFS by 2 points to 9 for improved functional ability and balance with desired activities  Baseline: 7 03/30/2024: 9 Goal status: met  2.  Pt Heru improve condition 4 on mCTSIB to  at least  30 seconds on average of 3 trials for improved sensory balance and overall stability Baseline: 14 sec 03/30/2024: 30 sec Goal status: MET  3.  Pt Jamarious improve bilateral calf length to at least 10 degrees for improved mobility and decreased pain  Baseline: see chart Goal status: MET  4.  Pt Terrill be compliant and knowledgeable with advanced HEP for improved comfort and carryover post discharge from therapy Baseline: N/A  Goal status: MET   PLAN:  PT FREQUENCY: 1x/week  PT DURATION: 8 weeks  PLANNED INTERVENTIONS: [id number]- PT Re-evaluation, 97110-Therapeutic exercises, 97530- Therapeutic activity, [id number]- Neuromuscular re-education, 97535- Self Care, [id number]- Manual therapy, [id number]- Gait training, [phone]- Electrical stimulation (unattended), [id number]- Electrical stimulation (manual), 20560 (1-2 muscles), 20561 (3+ muscles)- Dry Needling, Cryotherapy, and Moist heat  PLAN FOR NEXT SESSION: assess HEP response, calf stretching, sensory balance training    Alm JAYSON Kingdom PT  03/30/24 3:54 PM

## 2024-04-07 ENCOUNTER — Ambulatory Visit: Attending: Cardiology | Admitting: Cardiology

## 2024-04-07 ENCOUNTER — Encounter: Payer: Self-pay | Admitting: Cardiology

## 2024-04-07 VITALS — BP 145/73 | HR 63 | Ht 70.0 in | Wt 238.0 lb

## 2024-04-07 DIAGNOSIS — I25119 Atherosclerotic heart disease of native coronary artery with unspecified angina pectoris: Secondary | ICD-10-CM

## 2024-04-07 DIAGNOSIS — E782 Mixed hyperlipidemia: Secondary | ICD-10-CM

## 2024-04-07 DIAGNOSIS — I340 Nonrheumatic mitral (valve) insufficiency: Secondary | ICD-10-CM

## 2024-04-07 DIAGNOSIS — Z789 Other specified health status: Secondary | ICD-10-CM

## 2024-04-07 DIAGNOSIS — I10 Essential (primary) hypertension: Secondary | ICD-10-CM

## 2024-04-07 NOTE — Progress Notes (Addendum)
 " Cardiology Office Note:  .   Date:  04/07/2024  ID:  Steven Mckee, DOB [birth year], MRN [id number] PCP: Steven Motto, DO  [facility] Providers Cardiologist:  Steven Parchment, MD Sleep Medicine:  Wilbert Bihari, MD     History of Present Illness: .   Steven Mckee is a 73 y.o. male Discussed the use of AI scribe software   History of Present Illness Steven Mckee is a 73 year old male with coronary artery disease and mitral valve regurgitation who presents for cardiovascular follow-up.  He has a history of coronary artery disease with a prior myocardial infarction and chronic right coronary artery occlusion. A cardiac catheterization in February 2011 showed total occlusion of the right coronary artery with left to right collaterals, moderate mitral valve regurgitation, and 50% proximal LAD stenosis. A stress test in 2020 was reported as low risk and showed inferolateral wall ischemia.  He has mild to moderate mitral valve regurgitation with posterior leaflet prolapse. An echocardiogram on December 30, 2020 showed normal pump function, an ejection fraction of 65-70%, and mild concentric left ventricular hypertrophy. The mitral valve condition has been stable over time.  He has a history of hypertension, which he manages by monitoring his blood pressure at home. He notes variability in his blood pressure readings, higher when anxious or after caffeine intake. He recently stopped taking lisinopril , which he feels has given him more energy and enthusiasm for physical activities.  He has a history of hyperlipidemia and has experienced issues with statins in the past, including CK elevation to 309 in October 2020. He tried Zetia and bempedoic acid without sufficient LDL reduction. He was preapproved for Repatha  in February 2024, which successfully reduced his LDL to 30. He has experienced a slight return of joint and muscle discomfort but remains active.  He has interstitial lung disease  and prediabetes, which he manages with metformin and weight loss. He has lost 25 pounds through calorie counting and reports feeling much better, although he still considers himself obese. He no longer uses a CPAP machine, as weight loss has reduced his snoring and related issues.  He mentions a tendency to consume hard candy, which he attributes to a possible dopamine release, and is trying to limit his intake due to calorie concerns.     Studies Reviewed: SABRA   EKG Interpretation Date/Time:  Tuesday April 07 2024 10:26:54 EDT Ventricular Rate:  63 PR Interval:  392 QRS Duration:  118 QT Interval:  428 QTC Calculation: 437 R Axis:   69  Text Interpretation: Sinus rhythm with 1st degree A-V block with Premature atrial complexes with Abberant conduction Inferior infarct , age undetermined Cannot rule out Anterior infarct , age undetermined When compared with ECG of 17-Jun-2018 11:51, Abberant conduction is now Present Vent. rate has decreased BY  32 BPM Inferior infarct is now Present T wave inversion more evident in Inferior leads Confirmed by Mckee Steven ([id number]) on 04/07/2024 10:42:59 AM    Results LABS CK: 309 (03/2019)  DIAGNOSTIC Echocardiogram: Normal left ventricular function, stable mild to moderate mitral valve regurgitation, ejection fraction 65-70%, mild concentric left ventricular hypertrophy (12/31/2022) Cardiac catheterization: Total occlusion of right coronary artery with left to right collaterals, moderate mitral valve regurgitation, 50% proximal left anterior descending artery stenosis (07/2009) Stress test: Low risk with inferolateral wall ischemia from occluded right coronary artery (2020) Electrocardiogram: Normal Risk Assessment/Calculations:           Physical Exam:   VS:  BP ROLLEN)  145/73   Pulse 63   Ht 5' 10 (1.778 m)   Wt 238 lb (108 kg)   SpO2 98%   BMI 34.15 kg/m    Wt Readings from Last 3 Encounters:  04/07/24 238 lb (108 kg)  03/27/24 244 lb 12.8  oz (111 kg)  07/02/23 246 lb (111.6 kg)    GEN: Well nourished, well developed in no acute distress NECK: No JVD; No carotid bruits CARDIAC: RRR, 2/6 HSM apex, no rubs, no gallops RESPIRATORY:  Clear to auscultation without rales, wheezing or rhonchi  ABDOMEN: Soft, non-tender, non-distended EXTREMITIES:  No edema; No deformity   ASSESSMENT AND PLAN: .    Assessment and Plan Assessment & Plan Coronary artery disease with chronic right coronary artery occlusion and prior myocardial infarction Chronic right coronary artery occlusion with prior myocardial infarction. Stress test in 2020 showed low risk with inferolateral wall ischemia. Condition is well-managed with no new symptoms. - Continue current management and monitoring  Mitral valve regurgitation with posterior leaflet prolapse and mild concentric left ventricular hypertrophy Mild to moderate mitral valve regurgitation with posterior leaflet prolapse. Echocardiogram shows normal pump function with EF 65-70% and mild concentric left ventricular hypertrophy. No progression noted. - Continue periodic echocardiogram monitoring - Consider valve repair or clipping if regurgitation becomes severe  Mixed hyperlipidemia Statin intolerance LDL decreased to 30 with Repatha . Previous statin therapy caused CK elevation. Repatha  well tolerated with excellent lipid control. Mild joint and muscle discomfort noted, possibly age-related or due to activity level. - Continue Repatha  therapy - Monitor for any increase in joint or muscle discomfort  Essential hypertension Blood pressure readings are generally well-controlled. Experiences occasional anxiety-related increases, possibly white coat hypertension. Discontinued lisinopril  with no adverse effects. Blood pressure log shows satisfactory control. - Continue sporadic blood pressure monitoring - Maintain blood pressure log  Obesity Significant weight loss of 25 pounds through calorie counting.  Considering GLP-1 therapy for further weight management. Comfortable with GLP-1 therapy from a cardiovascular perspective. Discussed potential benefits and maintenance use of GLP-1 therapy. Referred to primary care for initiation of GLP-1 therapy. - Consult primary care provider for GLP-1 therapy initiation  Type 2 diabetes Improved control through weight loss and metformin. Primary care provider supportive of GLP-1 therapy for additional management. - Consult primary care provider for GLP-1 therapy initiation, assist with weight loss as well.         Dispo: 1 yr  Signed, Steven Parchment, MD   "

## 2024-04-07 NOTE — Patient Instructions (Signed)

## 2024-05-13 ENCOUNTER — Ambulatory Visit
Admission: RE | Admit: 2024-05-13 | Discharge: 2024-05-13 | Disposition: A | Source: Ambulatory Visit | Attending: Internal Medicine | Admitting: Internal Medicine

## 2024-05-13 DIAGNOSIS — J849 Interstitial pulmonary disease, unspecified: Secondary | ICD-10-CM

## 2024-05-29 NOTE — Progress Notes (Signed)
 Steven Mckee                                          MRN: [id number]   05/29/2024   The [facility] Quality Team Specialist reviewed this patient medical record for the purposes of chart review for care gap closure. The following were reviewed: chart review for care gap closure-kidney health evaluation for diabetes:eGFR  and uACR.    [facility] Quality Team

## 2024-06-03 ENCOUNTER — Ambulatory Visit: Payer: Self-pay | Admitting: Internal Medicine

## 2024-06-03 ENCOUNTER — Telehealth: Payer: Self-pay | Admitting: Internal Medicine

## 2024-06-03 DIAGNOSIS — J849 Interstitial pulmonary disease, unspecified: Secondary | ICD-10-CM

## 2024-06-03 NOTE — Progress Notes (Signed)
 Patient needs followup next 4-12 weeks with APP or Dr Geronimo but after spiro/dlco

## 2024-06-03 NOTE — Telephone Encounter (Signed)
 Hi Steven Mckee   The radiologist feels that the ILD is slowly and steadily getting worse. Worse Dec 2025 compared to a year ago. I know you feel well and the PFTs as of a year ago were still stable but at this point I recommend treatment  Plan  - do spiro/dlco next 4-12 weeks and see me or APP to discuss anti-fibrotic.  - this is important  Thanks  Dr R       Latest Ref Rng & Units 05/23/2023    4:18 PM 02/19/2023    9:30 AM 10/15/2022    1:08 PM 07/02/2022   10:27 AM 03/30/2022    1:56 PM 12/26/2021    8:58 AM  PFT Results  FVC-Pre L 3.21  2.91  3.20  3.21  3.21  3.24   FVC-Predicted Pre % 73  66  73  73  72  73   FVC-Post L      3.21   FVC-Predicted Post %      72   Pre FEV1/FVC % % 87  88  88  87  88  87   Post FEV1/FCV % %      90   FEV1-Pre L 2.79  2.57  2.81  2.78  2.82  2.83   FEV1-Predicted Pre % 86  79  87  86  86  86   FEV1-Post L      2.89   DLCO uncorrected ml/min/mmHg 20.32  19.02  22.52  21.09  21.63  21.52   DLCO UNC% % 78  73  87  81  83  83   DLCO corrected ml/min/mmHg 20.32  19.02  22.52  21.09  21.63  21.52   DLCO COR %Predicted % 78  73  87  81  83  83   DLVA Predicted % 106  105  117  106  112  108   TLC L      4.67   TLC % Predicted %      66   RV % Predicted %      54

## 2024-06-08 NOTE — Telephone Encounter (Signed)
 Pt scheduled

## 2024-06-09 ENCOUNTER — Telehealth: Payer: Self-pay | Admitting: Cardiology

## 2024-06-09 NOTE — Telephone Encounter (Signed)
 Listed crestor  as intolerance in pt's chart. (D/t elevated CK)  MD Guillaume need to add diagnosis for statin intolerance.

## 2024-06-09 NOTE — Telephone Encounter (Signed)
 Pharmacy is asking for us  to put in a Statin intolerance ICD Code for the pt since he is on Repatha 

## 2024-07-22 ENCOUNTER — Encounter (HOSPITAL_BASED_OUTPATIENT_CLINIC_OR_DEPARTMENT_OTHER)

## 2024-07-29 ENCOUNTER — Ambulatory Visit: Admitting: Internal Medicine

## 2024-08-07 ENCOUNTER — Encounter

## 2024-08-07 ENCOUNTER — Ambulatory Visit: Admitting: Internal Medicine
